# Patient Record
Sex: Male | Born: 1965
Health system: Southern US, Community
[De-identification: ages and names within clinical notes are randomized; demographics above are authoritative.]

## PROBLEM LIST (undated history)

## (undated) DIAGNOSIS — R251 Tremor, unspecified: Secondary | ICD-10-CM

## (undated) DIAGNOSIS — G4752 REM sleep behavior disorder: Secondary | ICD-10-CM

## (undated) DIAGNOSIS — E079 Disorder of thyroid, unspecified: Secondary | ICD-10-CM

## (undated) DIAGNOSIS — K219 Gastro-esophageal reflux disease without esophagitis: Secondary | ICD-10-CM

## (undated) DIAGNOSIS — R55 Syncope and collapse: Secondary | ICD-10-CM

## (undated) DIAGNOSIS — I499 Cardiac arrhythmia, unspecified: Secondary | ICD-10-CM

## (undated) DIAGNOSIS — G43909 Migraine, unspecified, not intractable, without status migrainosus: Secondary | ICD-10-CM

## (undated) DIAGNOSIS — M1990 Unspecified osteoarthritis, unspecified site: Secondary | ICD-10-CM

## (undated) DIAGNOSIS — G2581 Restless legs syndrome: Secondary | ICD-10-CM

## (undated) DIAGNOSIS — J9819 Other pulmonary collapse: Secondary | ICD-10-CM

## (undated) DIAGNOSIS — F32A Depression, unspecified: Secondary | ICD-10-CM

## (undated) DIAGNOSIS — G20A1 Parkinson's disease without dyskinesia, without mention of fluctuations: Secondary | ICD-10-CM

## (undated) DIAGNOSIS — N400 Enlarged prostate without lower urinary tract symptoms: Secondary | ICD-10-CM

## (undated) DIAGNOSIS — G20 Parkinson's disease: Secondary | ICD-10-CM

## (undated) DIAGNOSIS — G5600 Carpal tunnel syndrome, unspecified upper limb: Secondary | ICD-10-CM

## (undated) DIAGNOSIS — F419 Anxiety disorder, unspecified: Secondary | ICD-10-CM

## (undated) DIAGNOSIS — Z87442 Personal history of urinary calculi: Secondary | ICD-10-CM

## (undated) DIAGNOSIS — F329 Major depressive disorder, single episode, unspecified: Secondary | ICD-10-CM

## (undated) HISTORY — PX: SHOULDER SURGERY: SHX246

## (undated) HISTORY — PX: APPENDECTOMY: SHX54

---

## 2004-11-05 ENCOUNTER — Ambulatory Visit: Payer: Self-pay | Admitting: Family Medicine

## 2005-02-17 ENCOUNTER — Ambulatory Visit: Payer: Self-pay | Admitting: Family Medicine

## 2006-09-05 ENCOUNTER — Ambulatory Visit: Payer: Self-pay | Admitting: Family Medicine

## 2006-09-20 ENCOUNTER — Ambulatory Visit: Payer: Self-pay | Admitting: General Surgery

## 2007-10-04 DIAGNOSIS — Z85828 Personal history of other malignant neoplasm of skin: Secondary | ICD-10-CM

## 2007-10-04 HISTORY — DX: Personal history of other malignant neoplasm of skin: Z85.828

## 2007-11-09 ENCOUNTER — Encounter: Payer: Self-pay | Admitting: Unknown Physician Specialty

## 2007-12-02 ENCOUNTER — Encounter: Payer: Self-pay | Admitting: Unknown Physician Specialty

## 2008-11-13 ENCOUNTER — Ambulatory Visit: Payer: Self-pay | Admitting: Family Medicine

## 2009-11-09 ENCOUNTER — Ambulatory Visit: Payer: Self-pay | Admitting: Specialist

## 2010-04-01 ENCOUNTER — Ambulatory Visit: Payer: Self-pay | Admitting: Family Medicine

## 2010-04-07 ENCOUNTER — Ambulatory Visit: Payer: Self-pay | Admitting: Family Medicine

## 2010-06-14 ENCOUNTER — Ambulatory Visit: Payer: Self-pay | Admitting: Family Medicine

## 2010-07-25 ENCOUNTER — Emergency Department: Payer: Self-pay | Admitting: Emergency Medicine

## 2014-01-07 ENCOUNTER — Ambulatory Visit: Payer: Self-pay | Admitting: Otolaryngology

## 2014-02-05 DIAGNOSIS — H2510 Age-related nuclear cataract, unspecified eye: Secondary | ICD-10-CM | POA: Insufficient documentation

## 2014-03-25 DIAGNOSIS — I10 Essential (primary) hypertension: Secondary | ICD-10-CM | POA: Insufficient documentation

## 2014-03-25 DIAGNOSIS — R002 Palpitations: Secondary | ICD-10-CM | POA: Insufficient documentation

## 2014-04-08 DIAGNOSIS — I493 Ventricular premature depolarization: Secondary | ICD-10-CM | POA: Insufficient documentation

## 2014-04-08 DIAGNOSIS — I4892 Unspecified atrial flutter: Secondary | ICD-10-CM

## 2014-04-08 DIAGNOSIS — I4891 Unspecified atrial fibrillation: Secondary | ICD-10-CM | POA: Insufficient documentation

## 2014-04-21 ENCOUNTER — Ambulatory Visit: Payer: Self-pay | Admitting: Family Medicine

## 2014-05-06 ENCOUNTER — Ambulatory Visit: Payer: Self-pay | Admitting: Neurology

## 2014-05-14 DIAGNOSIS — K625 Hemorrhage of anus and rectum: Secondary | ICD-10-CM | POA: Insufficient documentation

## 2014-05-14 DIAGNOSIS — K219 Gastro-esophageal reflux disease without esophagitis: Secondary | ICD-10-CM | POA: Insufficient documentation

## 2014-05-27 ENCOUNTER — Ambulatory Visit: Payer: Self-pay | Admitting: Gastroenterology

## 2014-05-27 HISTORY — PX: ESOPHAGOGASTRODUODENOSCOPY: SHX1529

## 2014-05-27 HISTORY — PX: COLONOSCOPY: SHX174

## 2014-05-29 LAB — PATHOLOGY REPORT

## 2014-06-10 ENCOUNTER — Ambulatory Visit: Payer: Self-pay | Admitting: Neurology

## 2014-07-29 ENCOUNTER — Ambulatory Visit: Payer: Self-pay | Admitting: Neurology

## 2014-07-29 LAB — COMPREHENSIVE METABOLIC PANEL
Albumin: 3.8 g/dL (ref 3.4–5.0)
Alkaline Phosphatase: 86 U/L
Anion Gap: 7 (ref 7–16)
BUN: 15 mg/dL (ref 7–18)
Bilirubin,Total: 0.5 mg/dL (ref 0.2–1.0)
CALCIUM: 8.4 mg/dL — AB (ref 8.5–10.1)
Chloride: 105 mmol/L (ref 98–107)
Co2: 27 mmol/L (ref 21–32)
Creatinine: 0.89 mg/dL (ref 0.60–1.30)
EGFR (Non-African Amer.): >60
Glucose: 105 mg/dL — ABNORMAL HIGH (ref 65–99)
Osmolality: 279 (ref 275–301)
POTASSIUM: 4 mmol/L (ref 3.5–5.1)
SGOT(AST): 17 U/L (ref 15–37)
SGPT (ALT): 26 U/L
SODIUM: 139 mmol/L (ref 136–145)
Total Protein: 7 g/dL (ref 6.4–8.2)

## 2014-07-29 LAB — PROTEIN, CSF: Protein, CSF: 42 mg/dL (ref 15–45)

## 2014-07-29 LAB — CBC WITH DIFFERENTIAL/PLATELET
BASOS ABS: 0.1 10*3/uL (ref 0.0–0.1)
BASOS PCT: 1.5 %
EOS ABS: 0.3 10*3/uL (ref 0.0–0.7)
Eosinophil %: 4.8 %
HCT: 42.5 % (ref 40.0–52.0)
HGB: 13.9 g/dL (ref 13.0–18.0)
LYMPHS ABS: 1.5 10*3/uL (ref 1.0–3.6)
LYMPHS PCT: 28.7 %
MCH: 30.6 pg (ref 26.0–34.0)
MCHC: 32.6 g/dL (ref 32.0–36.0)
MCV: 94 fL (ref 80–100)
Monocyte #: 0.5 x10 3/mm (ref 0.2–1.0)
Monocyte %: 10 %
NEUTROS ABS: 2.9 10*3/uL (ref 1.4–6.5)
Neutrophil %: 55 %
Platelet: 294 10*3/uL (ref 150–440)
RBC: 4.53 10*6/uL (ref 4.40–5.90)
RDW: 13.5 % (ref 11.5–14.5)
WBC: 5.3 10*3/uL (ref 3.8–10.6)

## 2014-07-29 LAB — CSF CELL COUNT WITH DIFFERENTIAL
CSF Tube #: 3
Eosinophil: 0 %
LYMPHS PCT: 58 %
Monocytes/Macrophages: 33 %
Neutrophils: 9 %
OTHER CELLS: 0 %
RBC (CSF): 393 /mm3
WBC (CSF): 3 /mm3

## 2014-07-29 LAB — PROTIME-INR
INR: 1
PROTHROMBIN TIME: 13 s (ref 11.5–14.7)

## 2014-07-29 LAB — GLUCOSE, CSF: GLUCOSE, CSF: 65 mg/dL (ref 40–75)

## 2014-07-31 ENCOUNTER — Encounter (HOSPITAL_COMMUNITY): Payer: Self-pay | Admitting: Emergency Medicine

## 2014-07-31 ENCOUNTER — Emergency Department (HOSPITAL_COMMUNITY)
Admission: EM | Admit: 2014-07-31 | Discharge: 2014-07-31 | Disposition: A | Discharge status: Home / Self Care | Payer: Managed Care, Other (non HMO) | Attending: Emergency Medicine | Admitting: Emergency Medicine

## 2014-07-31 ENCOUNTER — Ambulatory Visit
Admit: 2014-07-31 | Discharge: 2014-07-31 | Disposition: A | Discharge status: Home / Self Care | Payer: 59 | Attending: Emergency Medicine | Admitting: Emergency Medicine

## 2014-07-31 DIAGNOSIS — F32A Depression, unspecified: Secondary | ICD-10-CM | POA: Insufficient documentation

## 2014-07-31 DIAGNOSIS — R519 Headache, unspecified: Secondary | ICD-10-CM

## 2014-07-31 DIAGNOSIS — K219 Gastro-esophageal reflux disease without esophagitis: Secondary | ICD-10-CM | POA: Insufficient documentation

## 2014-07-31 DIAGNOSIS — Z79899 Other long term (current) drug therapy: Secondary | ICD-10-CM | POA: Diagnosis not present

## 2014-07-31 DIAGNOSIS — N400 Enlarged prostate without lower urinary tract symptoms: Secondary | ICD-10-CM | POA: Insufficient documentation

## 2014-07-31 DIAGNOSIS — R55 Syncope and collapse: Secondary | ICD-10-CM | POA: Insufficient documentation

## 2014-07-31 DIAGNOSIS — R51 Headache: Secondary | ICD-10-CM | POA: Diagnosis present

## 2014-07-31 DIAGNOSIS — N200 Calculus of kidney: Secondary | ICD-10-CM | POA: Insufficient documentation

## 2014-07-31 DIAGNOSIS — F329 Major depressive disorder, single episode, unspecified: Secondary | ICD-10-CM | POA: Insufficient documentation

## 2014-07-31 DIAGNOSIS — G43909 Migraine, unspecified, not intractable, without status migrainosus: Secondary | ICD-10-CM | POA: Insufficient documentation

## 2014-07-31 LAB — BASIC METABOLIC PANEL
Anion gap: 12 (ref 5–15)
BUN: 15 mg/dL (ref 6–23)
CO2: 25 meq/L (ref 19–32)
CREATININE: 0.87 mg/dL (ref 0.50–1.35)
Calcium: 9.2 mg/dL (ref 8.4–10.5)
Chloride: 102 mEq/L (ref 96–112)
GFR calc non Af Amer: >90 mL/min (ref >90)
Glucose, Bld: 103 mg/dL — ABNORMAL HIGH (ref 70–99)
Potassium: 4.5 mEq/L (ref 3.7–5.3)
Sodium: 139 mEq/L (ref 137–147)

## 2014-07-31 LAB — CBC
HEMATOCRIT: 42.4 % (ref 39.0–52.0)
Hemoglobin: 14.6 g/dL (ref 13.0–17.0)
MCH: 31.5 pg (ref 26.0–34.0)
MCHC: 34.4 g/dL (ref 30.0–36.0)
MCV: 91.4 fL (ref 78.0–100.0)
Platelets: 270 10*3/uL (ref 150–400)
RBC: 4.64 MIL/uL (ref 4.22–5.81)
RDW: 12.9 % (ref 11.5–15.5)
WBC: 5.7 10*3/uL (ref 4.0–10.5)

## 2014-07-31 MED ORDER — HYDROMORPHONE HCL 1 MG/ML IJ SOLN
0.5000 mg | Freq: Once | INTRAMUSCULAR | Status: AC
  Administered 2014-07-31: 0.5 mg via INTRAVENOUS
  Filled 2014-07-31: qty 1

## 2014-07-31 MED ORDER — IOHEXOL 180 MG/ML  SOLN
1.0000 mL | Freq: Once | INTRAMUSCULAR | Status: AC | PRN
  Administered 2014-07-31: 1 mL via EPIDURAL

## 2014-07-31 MED ORDER — ONDANSETRON HCL 4 MG/2ML IJ SOLN
4.0000 mg | Freq: Once | INTRAMUSCULAR | Status: AC
  Administered 2014-07-31: 4 mg via INTRAVENOUS
  Filled 2014-07-31: qty 2

## 2014-07-31 MED ORDER — SODIUM CHLORIDE 0.9 % IV BOLUS (SEPSIS)
1000.0000 mL | Freq: Once | INTRAVENOUS | Status: AC
  Administered 2014-07-31: 1000 mL via INTRAVENOUS

## 2014-07-31 NOTE — ED Notes (Signed)
Pt had LP on Tuesday for rule out MS, sent here today for Headache and possible leak from LP site.

## 2014-07-31 NOTE — Discharge Instructions (Signed)
Epidural Blood Patch Discharge Instructions  1. Go home and rest quietly for the next 24 hours.  It is important to lie flat for the next 24 hours.  Get up only to go to the restroom.  You may lie in the bed or on a couch on your back, your stomach, your left side or your right side.  You may have one pillow under your head.  You may have pillows between your knees while you are on your side or under your knees while you are on your back.  2. DO NOT drive today.  Recline the seat as far back as it will go, while still wearing your seat belt, on the way home.  3. You may get up to go to the bathroom as needed.  You may sit up for 10 minutes to eat.  You may resume your normal diet and medications unless otherwise indicated.  Drink plenty of extra fluids today and tomorrow.  Caffeine may help your body make spinal fluid faster, so add to or increase your caffeine intake as much as possible.    4. You may resume normal activities after your 24 hours of bed rest is over; however, do not exert yourself strongly or do any heavy lifting tomorrow.

## 2014-07-31 NOTE — Progress Notes (Signed)
20cc blood drawn from left AC space without difficulty for epidural blood patch.  jkl

## 2014-07-31 NOTE — ED Notes (Signed)
PA student at bedside.  Patient had LP on Tuesday to r/o MS with PCP, patient rested all day after the procedure and the next day he went to work.  Patient states he had to leave work early because of headache.  Patient has had positional headache, neck pain, and nausea (sitting up makes worse, lying down makes it better).  Also c/o tingling in legs and face, and photophobia.  Denies chest pain, abdominal pain.

## 2014-07-31 NOTE — ED Notes (Signed)
Dr. Corky Downslsen ( regional performed lp)

## 2014-07-31 NOTE — Discharge Instructions (Signed)
Go straight to [facility] at [employer].  Please call your doctor for a followup appointment within 24-48 hours. When you talk to your doctor please let them know that you were seen in the emergency department and have them acquire all of your records so that they can discuss the findings with you and formulate a treatment plan to fully care for your new and ongoing problems.

## 2014-07-31 NOTE — ED Provider Notes (Signed)
CSN: [id number]     Arrival date & time 07/31/14  [id number] History   First MD Initiated Contact with Patient 07/31/14 [phone]     Chief Complaint  Patient presents with  . Headache     (Consider location/radiation/quality/duration/timing/severity/associated sxs/prior Treatment) HPI Comments: Pt had LP 2 days ago for ? MS sx and abnormal MRI's over last year - He has had ongoing positional headache - improved with laying down. He states that he has associated diplopia, nausea, photophobia. The symptoms are persistent over the last 2 days, Tylenol without any improvement. Denies focal weakness or numbness of the legs and has minimal pain in his lower back at the lumbar puncture site. He states that there was one attempt under fluoroscopy guidance but manipulation of the needle under attempt was significant and the patient had a hard time producing CSF thus he was tilted into different positions and the needle was manipulated to obtain better CSF flow.  Patient is a 48 y.o. male presenting with headaches. The history is provided by the patient and the spouse.  Headache   History reviewed. No pertinent past medical history. History reviewed. No pertinent past surgical history. History reviewed. No pertinent family history. History  Substance Use Topics  . Smoking status: Never Smoker   . Smokeless tobacco: Not on file  . Alcohol Use: No    Review of Systems  Neurological: Positive for headaches.  All other systems reviewed and are negative.     Allergies  Codeine  Home Medications   Prior to Admission medications   Medication Sig Start Date End Date Taking? Authorizing Provider  cholecalciferol (VITAMIN D-400) 400 UNITS TABS tablet Take 800 Units by mouth daily.   Yes Historical Provider, MD  omeprazole (PRILOSEC) 20 MG capsule Take 20 mg by mouth daily.   Yes Historical Provider, MD  sertraline (ZOLOFT) 100 MG tablet Take 75 mg by mouth daily.   Yes Historical Provider, MD   Clindamycin Phosphate foam  05/08/14   Historical Provider, MD  doxycycline (VIBRA-TABS) 100 MG tablet  06/26/14   Historical Provider, MD  Omega-3 Fatty Acids (FISH OIL CONCENTRATE) 300 MG CAPS Take by mouth. 10/26/12   Historical Provider, MD  polyethylene glycol-electrolytes (NULYTELY/GOLYTELY) 420 G solution  05/08/14   Historical Provider, MD  predniSONE (DELTASONE) 10 MG tablet  06/24/14   Historical Provider, MD  sulfamethoxazole-trimethoprim (BACTRIM DS) 800-160 MG per tablet  05/04/14   Historical Provider, MD  tamsulosin (FLOMAX) 0.4 MG CAPS capsule  06/18/14   Historical Provider, MD  triamcinolone cream (KENALOG) 0.1 %  05/08/14   Historical Provider, MD  zolpidem (AMBIEN) 10 MG tablet  06/14/14   Historical Provider, MD   BP 109/64  Pulse 49  Temp(Src) 97.9 F (36.6 C) (Oral)  Resp 16  Ht 6\' 2"  (1.88 m)  Wt 211 lb (95.709 kg)  BMI 27.08 kg/m2  SpO2 99% Physical Exam  Nursing note and vitals reviewed. Constitutional: He appears well-developed and well-nourished. No distress.  HENT:  Head: Normocephalic and atraumatic.  Mouth/Throat: Oropharynx is clear and moist. No oropharyngeal exudate.  Eyes: Conjunctivae and EOM are normal. Pupils are equal, round, and reactive to light. Right eye exhibits no discharge. Left eye exhibits no discharge. No scleral icterus.  Neck: Normal range of motion. Neck supple. No JVD present. No thyromegaly present.  Cardiovascular: Normal rate, regular rhythm, normal heart sounds and intact distal pulses.  Exam reveals no gallop and no friction rub.   No murmur heard. Pulmonary/Chest: Effort normal  and breath sounds normal. No respiratory distress. He has no wheezes. He has no rales.  Abdominal: Soft. Bowel sounds are normal. He exhibits no distension and no mass. There is no tenderness.  Musculoskeletal: Normal range of motion. He exhibits no edema and no tenderness.  Lymphadenopathy:    He has no cervical adenopathy.  Neurological: He is alert.  Coordination normal.  Speech is clear, movements are coordinated and accurate, strength is normal in all 4 extremities, sensation is normal in all 4 extremities, cranial nerves III through XII intact  Skin: Skin is warm and dry. No rash noted. No erythema.  Psychiatric: He has a normal mood and affect. His behavior is normal.    ED Course  Procedures (including critical care time) Labs Review Labs Reviewed  BASIC METABOLIC PANEL - Abnormal; Notable for the following:    Glucose, Bld 103 (*)    All other components within normal limits  CBC    Imaging Review Dg Epidurography  07/31/2014   CLINICAL DATA:  Post lumbar puncture headache.  FLUOROSCOPY TIME:  0 min 28 seconds  Dose: 0.110 Gycm2  PROCEDURE: LUMBAR EPIDURAL BLOOD PATCH INJECTION  After a thorough discussion of risks and benefits of the procedure, written and verbal consent was obtained. Specific risks included puncture of the thecal sac and dura as well as nontherapeutic injection with general risks of bleeding, infection, injury to nerves, blood vessels, and adjacent structures. Verbal consent was obtained by Dr. Carlota RaspberryLamke. We discussed the medium to high probability of achievement of therapeutic goal, dependent on volume of infusion.  Prior to the procedure, 20 ml of the patient's blood was harvested using stringent sterile technique.  An interlaminar approach was performed at L3-L4. Under stringent sterile technique, overlying skin was cleansed with betadine soap and anesthetized with 1% lidocaine without epinephrine. A 3.5 inch 20 gauge needle was advanced using loss-of-resistance technique.  DIAGNOSTIC EPIDURAL INJECTION  Injection of Omnipaque 180 shows a good epidural pattern with spread above and below the level of needle placement, primarily on the side of needle placement. No vascular or subarachnoid opacification is seen.  THERAPEUTIC EPIDURAL INJECTION  15 ml of the patient's blood was injected into the epidural space at the site  of prior lumbar puncture.  IMPRESSION: Technically successful lumbar blood patch at L3-L4.   Electronically Signed   By: Andreas NewportGeoffrey  Lamke M.D.   On: 07/31/2014 12:14      MDM   Final diagnoses:  Headache    The patient has normal vital signs, has ongoing positional headache and may benefit from a blood patch. We will place order, discussed with radiologic technician who will communicate with radiology physician. Patient otherwise appears stable and does not have any signs of post LP infection or hemorrhage.  D/w Dr. Margo AyeHall who [facility].  D/w, staff at imaging facility, orders placed, d/c to facility - family in agrement.  Meds given in ED:  Medications  sodium chloride 0.9 % bolus 1,000 mL (0 mLs Intravenous Stopped 07/31/14 1055)  HYDROmorphone (DILAUDID) injection 0.5 mg (0.5 mg Intravenous Given 07/31/14 1055)  ondansetron (ZOFRAN) injection 4 mg (4 mg Intravenous Given 07/31/14 1055)    Discharge Medication List as of 07/31/2014 10:44 AM          Vida RollerBrian D Shahin Knierim, MD 07/31/14 1650

## 2014-08-20 DIAGNOSIS — G20 Parkinson's disease: Secondary | ICD-10-CM | POA: Insufficient documentation

## 2014-09-04 ENCOUNTER — Other Ambulatory Visit: Payer: Self-pay | Admitting: Urology

## 2014-09-04 NOTE — Progress Notes (Signed)
Called Dr. Vevelyn RoyalsBorden's office and asked for release of  orders to Epic sign and held surgery 09-08-14 pre op 09-05-14 Thanks

## 2014-09-04 NOTE — Patient Instructions (Addendum)
Kurt Maldonado  09/04/2014   Your procedure is scheduled on:  09/08/2014    Come thru the Cancer Center Entrance. .  Follow the Signs to Short Stay Center at   1245pm  Call this number if you have problems the morning of surgery: [phone]   Remember:   Do not eat food after midnite.  May have clear liquids until 0800am then nothing by mouth.    Take these medicines the morning of surgery with A SIP OF WATER: Proscar, Prilosec, Flomax    Do not wear jewelry,   Do not wear lotions, powders, or perfumes.  deodorant.   Men may shave face and neck.  Do not bring valuables to the hospital.  Contacts, dentures or bridgework may not be worn into surgery.       Patients discharged the day of surgery will not be allowed to drive  home.  Name and phone number of your driver:       Please read over the following fact sheets that you were given:    CLEAR LIQUID DIET   Foods Allowed                                                                     Foods Excluded  Coffee and tea, regular and decaf                             liquids that you cannot  Plain Jell-O in any flavor                                             see through such as: Fruit ices (not with fruit pulp)                                     milk, soups, orange juice  Iced Popsicles                                    All solid food Carbonated beverages, regular and diet                                    Cranberry, grape and apple juices Sports drinks like Gatorade Lightly seasoned clear broth or consume(fat free) Sugar, honey syrup  Sample Menu Breakfast                                Lunch                                     Supper Cranberry juice                    Beef broth  Chicken broth Jell-O                                     Grape juice                           Apple juice Coffee or tea                        Jell-O                                      Popsicle                                Coffee or tea                        Coffee or tea  _____________________________________________________________________   coughing and deep breathing exercises, leg exercises            [place] - Preparing for Surgery Before surgery, you can play an important role.  Because skin is not sterile, your skin needs to be as free of germs as possible.  You can reduce the number of germs on your skin by washing with CHG (chlorahexidine gluconate) soap before surgery.  CHG is an antiseptic cleaner which kills germs and bonds with the skin to continue killing germs even after washing. Please DO NOT use if you have an allergy to CHG or antibacterial soaps.  If your skin becomes reddened/irritated stop using the CHG and inform your nurse when you arrive at Short Stay. Do not shave (including legs and underarms) for at least 48 hours prior to the first CHG shower.  You may shave your face/neck. Please follow these instructions carefully:  1.  Shower with CHG Soap the night before surgery and the  morning of Surgery.  2.  If you choose to wash your hair, wash your hair first as usual with your  normal  shampoo.  3.  After you shampoo, rinse your hair and body thoroughly to remove the  shampoo.                           4.  Use CHG as you would any other liquid soap.  You can apply chg directly  to the skin and wash                       Gently with a scrungie or clean washcloth.  5.  Apply the CHG Soap to your body ONLY FROM THE NECK DOWN.   Do not use on face/ open                           Wound or open sores. Avoid contact with eyes, ears mouth and genitals (private parts).                       Wash face,  Genitals (private parts) with your normal soap.             6.  Wash thoroughly, paying special attention to  the area where your surgery  will be performed.  7.  Thoroughly rinse your body with warm water from the neck down.  8.  DO NOT shower/wash with your normal  soap after using and rinsing off  the CHG Soap.                9.  Pat yourself dry with a clean towel.            10.  Wear clean pajamas.            11.  Place clean sheets on your bed the night of your first shower and do not  sleep with pets. Day of Surgery : Do not apply any lotions/deodorants the morning of surgery.  Please wear clean clothes to the hospital/surgery center.  FAILURE TO FOLLOW THESE INSTRUCTIONS MAY RESULT IN THE CANCELLATION OF YOUR SURGERY PATIENT SIGNATURE_________________________________  NURSE SIGNATURE__________________________________  ________________________________________________________________________

## 2014-09-05 ENCOUNTER — Ambulatory Visit (HOSPITAL_COMMUNITY)
Admission: RE | Admit: 2014-09-05 | Discharge: 2014-09-05 | Disposition: A | Discharge status: Home / Self Care | Payer: Managed Care, Other (non HMO) | Source: Ambulatory Visit | Attending: Urology | Admitting: Urology

## 2014-09-05 ENCOUNTER — Encounter (INDEPENDENT_AMBULATORY_CARE_PROVIDER_SITE_OTHER): Payer: Self-pay

## 2014-09-05 ENCOUNTER — Encounter (HOSPITAL_COMMUNITY): Payer: Self-pay

## 2014-09-05 ENCOUNTER — Encounter (HOSPITAL_COMMUNITY)
Admission: RE | Admit: 2014-09-05 | Discharge: 2014-09-05 | Disposition: A | Discharge status: Home / Self Care | Payer: Managed Care, Other (non HMO) | Source: Ambulatory Visit | Attending: Urology | Admitting: Urology

## 2014-09-05 DIAGNOSIS — Z01818 Encounter for other preprocedural examination: Secondary | ICD-10-CM | POA: Diagnosis present

## 2014-09-05 DIAGNOSIS — R002 Palpitations: Secondary | ICD-10-CM | POA: Insufficient documentation

## 2014-09-05 HISTORY — DX: Unspecified osteoarthritis, unspecified site: M19.90

## 2014-09-05 HISTORY — DX: Syncope and collapse: R55

## 2014-09-05 HISTORY — DX: Cardiac arrhythmia, unspecified: I49.9

## 2014-09-05 HISTORY — DX: Other pulmonary collapse: J98.19

## 2014-09-05 HISTORY — DX: Anxiety disorder, unspecified: F41.9

## 2014-09-05 HISTORY — DX: Gastro-esophageal reflux disease without esophagitis: K21.9

## 2014-09-05 LAB — BASIC METABOLIC PANEL
Anion gap: 13 (ref 5–15)
BUN: 15 mg/dL (ref 6–23)
CALCIUM: 9.6 mg/dL (ref 8.4–10.5)
CO2: 25 meq/L (ref 19–32)
CREATININE: 0.87 mg/dL (ref 0.50–1.35)
Chloride: 100 mEq/L (ref 96–112)
GFR calc Af Amer: >90 mL/min (ref >90)
GLUCOSE: 90 mg/dL (ref 70–99)
Potassium: 4.3 mEq/L (ref 3.7–5.3)
Sodium: 138 mEq/L (ref 137–147)

## 2014-09-05 LAB — CBC
HEMATOCRIT: 39 % (ref 39.0–52.0)
HEMOGLOBIN: 13.2 g/dL (ref 13.0–17.0)
MCH: 30.7 pg (ref 26.0–34.0)
MCHC: 33.8 g/dL (ref 30.0–36.0)
MCV: 90.7 fL (ref 78.0–100.0)
Platelets: 278 10*3/uL (ref 150–400)
RBC: 4.3 MIL/uL (ref 4.22–5.81)
RDW: 12.9 % (ref 11.5–15.5)
WBC: 5.6 10*3/uL (ref 4.0–10.5)

## 2014-09-05 NOTE — Progress Notes (Signed)
Left message on voice mail of Yates DecampSelita Bradsher that orders need to be signed off in EPIC.

## 2014-09-05 NOTE — Progress Notes (Addendum)
EKG- 03/18/2014 chart  Evaluation of palpitations- OV note 04/08/14 on chart - Dr Darrold JunkerParaschos  03/27/14- Stress test on chart  Under Care Everywhere in EPIC- LOV note ( 08/20/14)  with Dr Sherryll BurgerShah ( Neuro) regarding syncopal episodes and EEG results  Done 08/15/14 in EPIC.

## 2014-09-06 NOTE — H&P (Signed)
Chief Complaint Lower urinary tract symptoms   History of Present Illness Kurt Maldonado is a 48 year old gentleman seen today at the request of Dr. Julieanne Manson for bothersome lower urinary tract symptoms. He has apparently had recurrent episodes of prostatitis including at least a few episodes of acute febrile prostatitis with positive bacterial cultures. I do not have the results of the specific organisms responsible for his prostatitis. His most recent episode was apparently in September when his urine culture was negative but he did have bothersome voiding symptoms and fever and was treated with doxycycline for possible prostatitis as well as possible upper respiratory infection. His symptoms however have been somewhat chronic and progressive over the last few years. His symptoms currently include a sense of incomplete emptying, urinary frequency, intermittency, urgency, weak stream, straining, and nocturia. His most bothersome symptoms include urinary frequency and urgency which can sometimes be quite severe. His baseline IPSS is 23. His baseline symptoms are with current treatment of tamsulosin. He began tamsulosin in September after his recent episode of prostatitis. He subsequently discontinued this medication and felt the symptoms were worse off therapy and therefore restarted tamsulosin with some symptomatic improvement. He does have significant bother from his symptoms.    He also has a family history of prostate cancer. His father was diagnosed at age 60 with prostate cancer and underwent treatment with a radical prostatectomy. His father also apparently has a renal mass thought to be renal cell carcinoma. This was diagnosed at age 39 and has been undergoing surveillance. His father is currently living at age 2. He denies other male family members who have had prostate cancer. He apparently has been undergoing regular PSA testing although I do not have those results today. Furthermore, he has  an aunt who died of metastatic breast cancer at age 17.     He denies a history of hematuria, STDs, GU malignancy/trauma/surgery.   Past Medical History Problems  1. History of Anxiety (F41.9) 2. History of benign prostatic hypertrophy (Z87.438) 3. History of depression (Z86.59) 4. History of Vitamin D deficiency (E55.9)  Surgical History Problems  1. History of Appendectomy 2. History of Lung Surgery 3. History of Rotator Cuff Repair 4. History of Shoulder Surgery  Current Meds 1. Multi-Day TABS;  Therapy: (Recorded:18Nov2015) to Recorded 2. Sertraline HCl TABS;  Therapy: (Recorded:18Nov2015) to Recorded 3. Tamsulosin HCl 0.4 MG CP24;  Therapy: (Recorded:18Nov2015) to Recorded  Allergies Medication  1. Codeine Derivatives  Family History Problems  1. Family history of malignant neoplasm of breast (Z80.3) : Paternal Grandmother 2. Family history of prostate cancer (Z80.42) : Father 3. Family history of rheumatoid arthritis (Z82.61)  Social History Problems    Denied: History of Alcohol use   Married   Never a smoker  Review of Systems Genitourinary, constitutional, skin, eye, otolaryngeal, hematologic/lymphatic, cardiovascular, pulmonary, endocrine, musculoskeletal, gastrointestinal, neurological and psychiatric system(s) were reviewed and pertinent findings if present are noted and are otherwise negative.  Constitutional: feeling tired (fatigue).  ENT: sinus problems.    Vitals Vital Signs [Data Includes: Last 1 Day]  Recorded: 18Nov2015 11:22AM  Height: 6 ft 2 in Weight: 211 lb  BMI Calculated: 27.09 BSA Calculated: 2.22 Blood Pressure: 119 / 76 Heart Rate: 77  Physical Exam Constitutional: Well nourished and well developed . No acute distress.  ENT:. The ears and nose are normal in appearance.  Neck: The appearance of the neck is normal and no neck mass is present.  Pulmonary: No respiratory distress, normal respiratory rhythm and effort  and clear  bilateral breath sounds.  Cardiovascular: Heart rate and rhythm are normal . No peripheral edema.  Abdomen: The abdomen is soft and nontender. No masses are palpated. No CVA tenderness. No hernias are palpable. No hepatosplenomegaly noted.  Rectal: Rectal exam demonstrates normal sphincter tone, no tenderness and no masses. Prostate size is estimated to be 40 g. The prostate has no nodularity and is not tender. The left seminal vesicle is nonpalpable. The right seminal vesicle is nonpalpable. The perineum is normal on inspection.  Genitourinary: Examination of the penis demonstrates no discharge, no masses, no lesions and a normal meatus. The scrotum is without lesions. The right epididymis is palpably normal and non-tender. The left epididymis is palpably normal and non-tender. The right testis is non-tender and without masses. The left testis is non-tender and without masses.  Lymphatics: The femoral and inguinal nodes are not enlarged or tender.  Skin: Normal skin turgor, no visible rash and no visible skin lesions.  Neuro/Psych:. Mood and affect are appropriate.    Results/Data Urine [Data Includes: Last 1 Day]   18Nov2015  COLOR YELLOW   APPEARANCE CLEAR   SPECIFIC GRAVITY 1.010   pH 7.5   GLUCOSE NEG mg/dL  BILIRUBIN NEG   KETONE NEG mg/dL  BLOOD NEG   PROTEIN NEG mg/dL  UROBILINOGEN 0.2 mg/dL  NITRITE NEG   LEUKOCYTE ESTERASE NEG    PVR: 203 cc   Assessment Assessed  1. Benign prostatic hyperplasia (BPH) with straining on urination (N40.1,R39.16)  Plan Benign prostatic hyperplasia (BPH) with straining on urination  1. Start: Finasteride 5 MG Oral Tablet; Take 1 tablet every day 2. Start: Tamsulosin HCl - 0.4 MG Oral Capsule; TAKE 2 CAPSULES AT       BEDTIME 3. PSA REFLEX TO FREE; Status:Hold For - Specimen/Data Collection,Appointment;  Requested for:18Nov2015;  4. PVR U/S; Status:Complete;   Done: 18Nov2015 5. PVR U/S; Status:Hold For - Appointment,Date of Service;  Requested for:18Nov2015;  6. Follow-up Month x 6 Office  Follow-up  Status: Hold For - Appointment,Date of Service   Requested for: 18Nov2015 Health Maintenance  7. UA With REFLEX; [Do Not Release]; Status:Complete;   Done: 18Nov2015 11:17AM  Discussion/Summary 1. BPH/LUTS: We discussed having him increase his tamsulosin is 0.8 mg nightly to see if this might prove to be more effective at least in the short-term. We also discussed maximizing medical therapy with finasteride 5 mg to provide additional benefit over the long-term. We reviewed proper use and potential side effects of these medications. I encouraged him to hold off on increasing his alpha blocker dose until he has completed a neurologic evaluation for an episode of syncope that recently occurred while he was off alpha blocker therapy. He will also have a baseline PSA today and he understands the expected effects of 5 alpha reductase inhibitor on his PSA. We will recheck his PSA in 6 months to assess his new baseline. He will follow-up at that time with a PVR/IPSS questionnaire to assess his response to treatment. If he does not see significant improvement, we have discussed proceeding with additional diagnostic evaluation at that time including cystoscopy/urodynamics.    Cc: Dr. Julieanne Mansonichard Gilbert     Verified Results PSA REFLEX TO FREE1 18Nov2015 12:30PM1 Lyda PeroneBorden, Lester1  SPECIMEN TYPE: BLOOD  [Aug 24, 2014 3:21PM Annelisa Ryback] I spoke with Mr. Lalla BrothersLambert and reviewed his PSA result and gave him options of biopsy vs. surveillance monitoring. He wishes to proceed with a prostate biopsy. Potential risks and complications reviewed and  he consents to proceed.   Test Name Result Flag Reference  PSA1 3.26 ng/mL1  <=4.001  TEST METHODOLOGY: ECLIA PSA (ELECTROCHEMILUMINESCENCE IMMUNOASSAY)     1. Amended By: Heloise PurpuraBorden, Elkin Belfield; Aug 24 2014 3:22 PM EST  Signatures Electronically signed by : Heloise PurpuraLester Madysen Faircloth, M.D.; Aug 24 2014  3:22PM EST

## 2014-09-07 NOTE — Anesthesia Preprocedure Evaluation (Addendum)
Anesthesia Evaluation  Patient identified by MRN, date of birth, ID band Patient awake    Reviewed: Allergy & Precautions, H&P , NPO status , Patient's Chart, lab work & pertinent test results  Airway Mallampati: II  TM Distance: >3 FB Neck ROM: Full    Dental no notable dental hx. (+) Dental Advisory Given   Pulmonary sleep apnea ,  breath sounds clear to auscultation  Pulmonary exam normal       Cardiovascular Exercise Tolerance: Good hypertension, negative cardio ROS  Rhythm:Regular Rate:Normal     Neuro/Psych  Headaches, PSYCHIATRIC DISORDERS Anxiety Depression    GI/Hepatic Neg liver ROS, GERD-  Medicated and Controlled,  Endo/Other  negative endocrine ROS  Renal/GU negative Renal ROS  negative genitourinary   Musculoskeletal  (+) Arthritis -, Osteoarthritis,    Abdominal   Peds negative pediatric ROS (+)  Hematology negative hematology ROS (+)   Anesthesia Other Findings   Reproductive/Obstetrics negative OB ROS                             Anesthesia Physical Anesthesia Plan  ASA: II  Anesthesia Plan: MAC   Post-op Pain Management:    Induction: Intravenous  Airway Management Planned: Nasal Cannula  Additional Equipment:   Intra-op Plan:   Post-operative Plan: Extubation in OR  Informed Consent: I have reviewed the patients History and Physical, chart, labs and discussed the procedure including the risks, benefits and alternatives for the proposed anesthesia with the patient or authorized representative who has indicated his/her understanding and acceptance.   Dental advisory given  Plan Discussed with: CRNA  Anesthesia Plan Comments:         Anesthesia Quick Evaluation

## 2014-09-08 ENCOUNTER — Encounter (HOSPITAL_COMMUNITY): Payer: Self-pay | Admitting: *Deleted

## 2014-09-08 ENCOUNTER — Ambulatory Visit (HOSPITAL_COMMUNITY): Payer: Managed Care, Other (non HMO) | Admitting: Anesthesiology

## 2014-09-08 ENCOUNTER — Ambulatory Visit (HOSPITAL_COMMUNITY)
Admission: RE | Admit: 2014-09-08 | Discharge: 2014-09-08 | Disposition: A | Discharge status: Home / Self Care | Payer: Managed Care, Other (non HMO) | Source: Ambulatory Visit | Attending: Urology | Admitting: Urology

## 2014-09-08 ENCOUNTER — Encounter (HOSPITAL_COMMUNITY)
Admission: RE | Disposition: A | Discharge status: Home / Self Care | Payer: Self-pay | Source: Ambulatory Visit | Attending: Urology

## 2014-09-08 ENCOUNTER — Emergency Department (HOSPITAL_COMMUNITY): Admission: EM | Admit: 2014-09-08 | Discharge: 2014-09-08 | Discharge status: Absent without leave | Payer: 59

## 2014-09-08 DIAGNOSIS — I10 Essential (primary) hypertension: Secondary | ICD-10-CM | POA: Insufficient documentation

## 2014-09-08 DIAGNOSIS — G4730 Sleep apnea, unspecified: Secondary | ICD-10-CM | POA: Diagnosis not present

## 2014-09-08 DIAGNOSIS — N400 Enlarged prostate without lower urinary tract symptoms: Secondary | ICD-10-CM | POA: Diagnosis present

## 2014-09-08 DIAGNOSIS — F329 Major depressive disorder, single episode, unspecified: Secondary | ICD-10-CM | POA: Insufficient documentation

## 2014-09-08 DIAGNOSIS — M1990 Unspecified osteoarthritis, unspecified site: Secondary | ICD-10-CM | POA: Insufficient documentation

## 2014-09-08 DIAGNOSIS — K219 Gastro-esophageal reflux disease without esophagitis: Secondary | ICD-10-CM | POA: Insufficient documentation

## 2014-09-08 DIAGNOSIS — Z8042 Family history of malignant neoplasm of prostate: Secondary | ICD-10-CM | POA: Diagnosis not present

## 2014-09-08 DIAGNOSIS — Z885 Allergy status to narcotic agent status: Secondary | ICD-10-CM | POA: Insufficient documentation

## 2014-09-08 DIAGNOSIS — E559 Vitamin D deficiency, unspecified: Secondary | ICD-10-CM | POA: Insufficient documentation

## 2014-09-08 DIAGNOSIS — F419 Anxiety disorder, unspecified: Secondary | ICD-10-CM | POA: Diagnosis not present

## 2014-09-08 DIAGNOSIS — N411 Chronic prostatitis: Secondary | ICD-10-CM | POA: Diagnosis not present

## 2014-09-08 HISTORY — PX: PROSTATE BIOPSY: SHX241

## 2014-09-08 SURGERY — BIOPSY, PROSTATE, RECTAL APPROACH, WITH US GUIDANCE
Anesthesia: Monitor Anesthesia Care

## 2014-09-08 MED ORDER — MIDAZOLAM HCL 2 MG/2ML IJ SOLN
INTRAMUSCULAR | Status: AC
  Filled 2014-09-08: qty 2

## 2014-09-08 MED ORDER — FLEET ENEMA 7-19 GM/118ML RE ENEM: 1.0000 | ENEMA | Freq: Once | RECTAL | Status: DC

## 2014-09-08 MED ORDER — MIDAZOLAM HCL 5 MG/5ML IJ SOLN
INTRAMUSCULAR | Status: DC | PRN
  Administered 2014-09-08: 2 mg via INTRAVENOUS

## 2014-09-08 MED ORDER — CEFAZOLIN SODIUM-DEXTROSE 2-3 GM-% IV SOLR
INTRAVENOUS | Status: AC
  Filled 2014-09-08: qty 50

## 2014-09-08 MED ORDER — LACTATED RINGERS IV SOLN
INTRAVENOUS | Status: DC | PRN
  Administered 2014-09-08: 15:00:00 via INTRAVENOUS

## 2014-09-08 MED ORDER — PROPOFOL 10 MG/ML IV BOLUS
INTRAVENOUS | Status: AC
  Filled 2014-09-08: qty 20

## 2014-09-08 MED ORDER — PROPOFOL 10 MG/ML IV EMUL
INTRAVENOUS | Status: DC | PRN
  Administered 2014-09-08: 50 mg via INTRAVENOUS

## 2014-09-08 MED ORDER — FENTANYL CITRATE 0.05 MG/ML IJ SOLN: 25.0000 ug | INTRAMUSCULAR | Status: DC | PRN

## 2014-09-08 MED ORDER — FENTANYL CITRATE 0.05 MG/ML IJ SOLN
INTRAMUSCULAR | Status: AC
  Filled 2014-09-08: qty 2

## 2014-09-08 MED ORDER — LACTATED RINGERS IV SOLN
INTRAVENOUS | Status: DC
  Administered 2014-09-08: 1000 mL via INTRAVENOUS

## 2014-09-08 MED ORDER — ONDANSETRON HCL 4 MG/2ML IJ SOLN
INTRAMUSCULAR | Status: AC
  Filled 2014-09-08: qty 2

## 2014-09-08 MED ORDER — ONDANSETRON HCL 4 MG/2ML IJ SOLN: 4.0000 mg | Freq: Once | INTRAMUSCULAR | Status: DC | PRN

## 2014-09-08 MED ORDER — ONDANSETRON HCL 4 MG/2ML IJ SOLN
INTRAMUSCULAR | Status: DC | PRN
  Administered 2014-09-08: 4 mg via INTRAVENOUS

## 2014-09-08 MED ORDER — LIDOCAINE HCL 2 % IJ SOLN
INTRAMUSCULAR | Status: AC
  Filled 2014-09-08: qty 20

## 2014-09-08 MED ORDER — FENTANYL CITRATE 0.05 MG/ML IJ SOLN
INTRAMUSCULAR | Status: DC | PRN
  Administered 2014-09-08: 50 ug via INTRAVENOUS

## 2014-09-08 MED ORDER — PROPOFOL INFUSION 10 MG/ML OPTIME
INTRAVENOUS | Status: DC | PRN
  Administered 2014-09-08: 100 ug/kg/min via INTRAVENOUS

## 2014-09-08 MED ORDER — LIDOCAINE HCL 2 % IJ SOLN
INTRAMUSCULAR | Status: DC | PRN
  Administered 2014-09-08: 10 mL

## 2014-09-08 MED ORDER — LIDOCAINE HCL (CARDIAC) 20 MG/ML IV SOLN
INTRAVENOUS | Status: AC
  Filled 2014-09-08: qty 5

## 2014-09-08 SURGICAL SUPPLY — 3 items
NEEDLE SPNL 22GX7 SPINOC (NEEDLE) ×3 IMPLANT
SYR CONTROL 10ML LL (SYRINGE) IMPLANT
UNDERPAD 30X30 INCONTINENT (UNDERPADS AND DIAPERS) ×3 IMPLANT

## 2014-09-08 NOTE — Progress Notes (Signed)
Patient and wife informed that surgery delayed. They verbalize understanding.

## 2014-09-08 NOTE — ED Notes (Signed)
RN called short stay to inquire about patient's encounter with them-informed them patient is in ED complaining of pain-per Pam, RN, states she was going to call MD to get script for pain so patient could avoid and ED visit

## 2014-09-08 NOTE — Anesthesia Postprocedure Evaluation (Signed)
  Anesthesia Post-op Note  Patient: Kurt PrestoRichard T Maldonado  Procedure(s) Performed: Procedure(s) (LRB): BIOPSY TRANSRECTAL ULTRASONIC PROSTATE (TUBP) (N/A)  Patient Location: PACU  Anesthesia Type: MAC  Level of Consciousness: awake and alert   Airway and Oxygen Therapy: Patient Spontanous Breathing  Post-op Pain: mild  Post-op Assessment: Post-op Vital signs reviewed, Patient's Cardiovascular Status Stable, Respiratory Function Stable, Patent Airway and No signs of Nausea or vomiting  Last Vitals:  Filed Vitals:   09/08/14 1645  BP: 109/68  Pulse: 58  Temp:   Resp: 15    Post-op Vital Signs: stable   Complications: No apparent anesthesia complications

## 2014-09-08 NOTE — Discharge Instructions (Signed)
Call if you develop fever > 101 or difficulty urinating.  Some blood in the urine or with bowel movements is expected for the next few days.  You also may experience some blood in the semen intermittently for the next couple of months which is normal.

## 2014-09-08 NOTE — Transfer of Care (Signed)
Immediate Anesthesia Transfer of Care Note  Patient: Kurt PrestoRichard T Innocent  Procedure(s) Performed: Procedure(s): BIOPSY TRANSRECTAL ULTRASONIC PROSTATE (TUBP) (N/A)  Patient Location: PACU  Anesthesia Type:MAC  Level of Consciousness: awake, alert  and oriented  Airway & Oxygen Therapy: Patient Spontanous Breathing and Patient connected to face mask oxygen  Post-op Assessment: Report given to PACU RN  Post vital signs: Reviewed and stable  Complications: No apparent anesthesia complications

## 2014-09-08 NOTE — Progress Notes (Addendum)
Received call from patient's wife who is in ER waiting room with patient who was discharge from short stay for transrectal ultrasound of the prostate at 1730. Patient had reported no pain to minimal pain at discharge. Paged Dr Marlou PorchHerrick who is on call. He is to call patient's wife. Dr Marlou PorchHerrick told writer that they never give pain medication for these biopsies as there is minimal pain with this procedure and patient should be evaluated if he is in this much pain.  1946  Called patient's cell phone. He is doing much better and left the ER waiting room to go home. Dr Marlou PorchHerrick thought pain and pressure was spasms. RN tells family that he will receive phone call in AM to check up on him.

## 2014-09-08 NOTE — Op Note (Signed)
Preoperative diagnosis: Elevated PSA  Postoperative diagnosis: Elevated PSA  Procedure: Transrectal ultrasound of the prostate and prostate needle biopsy under trans-ultrasound guidance.  Surgeon: Dr. Rolly SalterLester S Leiani Maldonado, Kurt HagemanJr.  Resident assistant: Dr. Almira BarWill Maldonado  Anesthesia: IV sedation  Complications: None  Specimens: Right lateral base, right base, right lateral mid, right mid, right lateral apex, right apex, left lateral base, left base, left lateral mid, left mid, left lateral apex, left apex of the prostate.  Disposition of specimens: To pathology  Indication: Mr. Kurt Maldonado is a 48 year old gentleman who was noted to have an elevated PSA and has elected to proceed with a prostate biopsy.  The potential risks, complications, expected recovery process, and alternative options have been discussed in detail and informed consent has been obtained.  Description of procedure: The patient was taken to the operating room and IV sedation was administered.  He was given preoperative antibiotics, placed in the left lateral decubitus position, and prepped in the usual sterile fashion.  He did receive a Fleet's enema prior to the procedure.  Transrectal ultrasound of the prostate was then performed using the transrectal probe. Sagittal and transverse images were reviewed.  Prostate volume was calculated at 40.9 cc.  Prostate length was 4.2 cm, prostate height was 3.4 cm, and prostate width was 5.5 cm.  The prostate appeared homogeneous without suspicious lesions.  There were no abnormalities noted of the seminal vesicles.  A total of 12 biopsy cores were then obtained using a sextant template with cores taken from the lateral and parasagittal regions of the base, mid, and apical regions of the prostate on both the right and left side.  All biopsy cores were placed in formalin and sent for permanent pathologic analysis.  The patient tolerated the procedure well and without complications.  Following removal of the  transrectal ultrasound probe, a rectal exam was performed and there was noted to be minimal bleeding.  He was able to be transferred to the recovery unit in satisfactory condition.

## 2014-09-08 NOTE — Interval H&P Note (Signed)
History and Physical Interval Note:  09/08/2014 2:57 PM  Kurt Maldonado  has presented today for surgery, with the diagnosis of ELEVATED PROSTATIC SPECIFIC ANTIGEN  The various methods of treatment have been discussed with the patient and family. After consideration of risks, benefits and other options for treatment, the patient has consented to  Procedure(s): BIOPSY TRANSRECTAL ULTRASONIC PROSTATE (TUBP) (N/A) as a surgical intervention .  The patient's history has been reviewed, patient examined, no change in status, stable for surgery.  I have reviewed the patient's chart and labs.  Questions were answered to the patient's satisfaction.     Jimi Giza,LES

## 2014-09-09 ENCOUNTER — Encounter (HOSPITAL_COMMUNITY): Payer: Self-pay | Admitting: Urology

## 2014-10-27 ENCOUNTER — Ambulatory Visit: Payer: Self-pay | Admitting: Family Medicine

## 2015-07-19 ENCOUNTER — Other Ambulatory Visit: Payer: Self-pay | Admitting: Family Medicine

## 2015-07-20 NOTE — Telephone Encounter (Signed)
controlled 

## 2015-08-26 ENCOUNTER — Other Ambulatory Visit: Payer: Self-pay | Admitting: Family Medicine

## 2015-09-15 ENCOUNTER — Ambulatory Visit: Payer: BLUE CROSS/BLUE SHIELD | Attending: Neurology

## 2015-09-15 DIAGNOSIS — G4733 Obstructive sleep apnea (adult) (pediatric): Secondary | ICD-10-CM | POA: Diagnosis not present

## 2015-09-15 DIAGNOSIS — G4761 Periodic limb movement disorder: Secondary | ICD-10-CM | POA: Insufficient documentation

## 2015-10-06 ENCOUNTER — Ambulatory Visit
Admission: RE | Admit: 2015-10-06 | Discharge: 2015-10-06 | Disposition: A | Discharge status: Home / Self Care | Payer: BLUE CROSS/BLUE SHIELD | Source: Ambulatory Visit | Attending: Family Medicine | Admitting: Family Medicine

## 2015-10-06 ENCOUNTER — Encounter: Payer: Self-pay | Admitting: Family Medicine

## 2015-10-06 ENCOUNTER — Ambulatory Visit (INDEPENDENT_AMBULATORY_CARE_PROVIDER_SITE_OTHER): Payer: BLUE CROSS/BLUE SHIELD | Admitting: Family Medicine

## 2015-10-06 VITALS — BP 112/74 | HR 62 | Temp 97.7°F | Resp 16 | Wt 220.0 lb

## 2015-10-06 DIAGNOSIS — R079 Chest pain, unspecified: Secondary | ICD-10-CM | POA: Diagnosis not present

## 2015-10-06 MED ORDER — HYDROCODONE-ACETAMINOPHEN 5-325 MG PO TABS: 1.0000 | ORAL_TABLET | Freq: Four times a day (QID) | ORAL | Status: DC | PRN

## 2015-10-06 MED ORDER — NAPROXEN 500 MG PO TABS
500.0000 mg | ORAL_TABLET | Freq: Two times a day (BID) | ORAL | Status: DC
Start: 2015-10-06 — End: 2015-11-25

## 2015-10-06 MED ORDER — OMEPRAZOLE 20 MG PO CPDR: 20.0000 mg | DELAYED_RELEASE_CAPSULE | Freq: Every morning | ORAL | Status: DC

## 2015-10-06 NOTE — Progress Notes (Signed)
Patient ID: Kurt Maldonado, male   DOB: 1966/08/05, 50 y.o.   MRN: [id number]    Subjective:  HPI Pt is here for chest pain. He reports that this has been going on for at least 2 weeks. He say he may have a little shortness of breath but nothing severe. He reports that laying down sometimes will make it better. He has a history of pneumothorax and reports this feels similar to that. He says it feels like something is sticking him in the left side of his lower chest. No sweats, nausea, arm pain or jaw pain. He has been more tired than normal.  No exertional pain whatsoever. Prior to Admission medications   Medication Sig Start Date End Date Taking? Authorizing Provider  oxymetazoline (AFRIN) 0.05 % nasal spray Place 2 sprays into both nostrils 2 (two) times daily as needed for congestion.   Yes Historical Provider, MD  sertraline (ZOLOFT) 100 MG tablet TAKE 1 TABLET BY MOUTH EVERY DAY Patient taking differently: 1/2 daily. He is only taking 50mg  daily 08/26/15  Yes Mieczyslaw Hulen Shouts., MD  triamcinolone cream (KENALOG) 0.1 % Apply 1 application topically daily as needed.  05/08/14   Historical Provider, MD  zolpidem (AMBIEN) 10 MG tablet TAKE 1 TABLET BY MOUTH EVERY DAY AT BEDTIME AS NEEDED FOR SLEEP 07/21/15   [name], MD    Patient Active Problem List   Diagnosis Date Noted  . Benign fibroma of prostate 07/31/2014  . Clinical depression 07/31/2014  . Acid reflux 07/31/2014  . Calculus of kidney 07/31/2014  . Headache, migraine 07/31/2014  . Neurocardiogenic syncope 07/31/2014  . Gastro-esophageal reflux disease without esophagitis 05/14/2014  . Bleeding per rectum 05/14/2014  . Beat, premature ventricular 04/08/2014  . Atrial fibrillation and flutter (HCC) 04/08/2014  . BP (high blood pressure) 03/25/2014  . Awareness of heartbeats 03/25/2014  . NS (nuclear sclerosis) 02/05/2014  . Atelectasis 08/04/1983    Past Medical History  Diagnosis Date  . Dysrhythmia    pvc's per pt on no medication   . Anxiety   . GERD (gastroesophageal reflux disease)   . Arthritis     hand   . Syncopal episodes     08/2014 - followed by Dr Sherryll Burger at [place], abnormal EEG- - note 08/20/14 in Care Everywhere )    . Collapsed lung     left lung- 1984     Social History   Social History  . Marital Status: Married    Spouse Name: N/A  . Number of Children: N/A  . Years of Education: N/A   Occupational History  . Not on file.   Social History Main Topics  . Smoking status: Never Smoker   . Smokeless tobacco: Never Used  . Alcohol Use: Yes     Comment: rare- 2 beers per month   . Drug Use: No  . Sexual Activity: Not on file   Other Topics Concern  . Not on file   Social History Narrative    Allergies  Allergen Reactions  . Codeine Nausea And Vomiting and Other (See Comments)    Muscle tightness     Review of Systems  Constitutional: Positive for malaise/fatigue.  HENT: Negative.   Eyes: Negative.   Respiratory: Positive for shortness of breath.   Cardiovascular: Positive for chest pain and palpitations (nothing new).  Gastrointestinal: Negative.   Genitourinary: Negative.   Musculoskeletal: Negative.   Skin: Negative.   Neurological: Negative.   Endo/Heme/Allergies: Negative.  There is no immunization history on file for this patient. Objective:  BP 112/74 mmHg  Pulse 62  Temp(Src) 97.7 F (36.5 C) (Oral)  Resp 16  Wt 220 lb (99.791 kg)  SpO2 97%  Physical Exam  Constitutional: He is oriented to person, place, and time and well-developed, well-nourished, and in no distress.  Eyes: Conjunctivae and EOM are normal. Pupils are equal, round, and reactive to light.  Neck: Normal range of motion. Neck supple.  Cardiovascular: Normal rate, regular rhythm, normal heart sounds and intact distal pulses.   Pulmonary/Chest: Effort normal and breath sounds normal.  Tender on the ribs, anterior and post ribs   Abdominal: Soft. Bowel sounds  are normal.  Musculoskeletal: Normal range of motion.  Neurological: He is alert and oriented to person, place, and time. He has normal reflexes. Gait normal. GCS score is 15.  Skin: Skin is warm and dry.  Psychiatric: Mood, memory, affect and judgment normal.    Lab Results  Component Value Date   WBC 5.6 09/05/2014   HGB 13.2 09/05/2014   HCT 39.0 09/05/2014   PLT 278 09/05/2014   GLUCOSE 90 09/05/2014   INR 1.0 07/29/2014    CMP     Component Value Date/Time   NA 138 09/05/2014 1330   NA 139 07/29/2014 0758   K 4.3 09/05/2014 1330   K 4.0 07/29/2014 0758   CL 100 09/05/2014 1330   CL 105 07/29/2014 0758   CO2 25 09/05/2014 1330   CO2 27 07/29/2014 0758   GLUCOSE 90 09/05/2014 1330   GLUCOSE 105* 07/29/2014 0758   BUN 15 09/05/2014 1330   BUN 15 07/29/2014 0758   CREATININE 0.87 09/05/2014 1330   CREATININE 0.89 07/29/2014 0758   CALCIUM 9.6 09/05/2014 1330   CALCIUM 8.4* 07/29/2014 0758   PROT 7.0 07/29/2014 0758   ALBUMIN 3.8 07/29/2014 0758   AST 17 07/29/2014 0758   ALT 26 07/29/2014 0758   ALKPHOS 86 07/29/2014 0758   BILITOT 0.5 07/29/2014 0758   GFRNONAA >90 09/05/2014 1330   GFRNONAA >60 07/29/2014 0758   GFRAA >90 09/05/2014 1330   GFRAA >60 07/29/2014 0758    Assessment and Plan :  1. Chest pain, unspecified chest pain type Seems to be muskuloskeletal.Check for pneumothorax. - EKG 12-Lead - DG Chest 2 View; Future - DG Chest Special View; Future  - omeprazole (PRILOSEC) 20 MG capsule; Take 1 capsule (20 mg total) by mouth every morning. Reported on 10/06/2015  Dispense: 30 capsule; Refill: 12 - naproxen (NAPROSYN) 500 MG tablet; Take 1 tablet (500 mg total) by mouth 2 (two) times daily with a meal.  Dispense: 60 tablet; Refill: 1 - POCT urinalysis dipstick - HYDROcodone-acetaminophen (NORCO/VICODIN) 5-325 MG tablet; Take 1 tablet by mouth every 6 (six) hours as needed for moderate pain.  Dispense: 25 tablet; Refill: 0   Patient was seen and  examined by Dr. Julieanne Mansonichard Gilbert, and noted scribed by Dimas ChyleBrittany Byrd, CMA  Julieanne Mansonichard Gilbert MD [facility] 10/06/2015 12:06 PM

## 2015-10-16 ENCOUNTER — Ambulatory Visit
Admission: RE | Admit: 2015-10-16 | Discharge: 2015-10-16 | Disposition: A | Discharge status: Home / Self Care | Payer: BLUE CROSS/BLUE SHIELD | Source: Ambulatory Visit | Attending: Family Medicine | Admitting: Family Medicine

## 2015-10-16 ENCOUNTER — Other Ambulatory Visit: Payer: Self-pay | Admitting: Family Medicine

## 2015-10-16 DIAGNOSIS — R0789 Other chest pain: Secondary | ICD-10-CM | POA: Diagnosis not present

## 2015-10-16 MED ORDER — IOHEXOL 350 MG/ML SOLN
100.0000 mL | Freq: Once | INTRAVENOUS | Status: AC | PRN
  Administered 2015-10-16: 85 mL via INTRAVENOUS

## 2015-10-20 ENCOUNTER — Other Ambulatory Visit: Payer: Self-pay | Admitting: Emergency Medicine

## 2015-10-20 ENCOUNTER — Ambulatory Visit
Admission: RE | Admit: 2015-10-20 | Discharge: 2015-10-20 | Disposition: A | Discharge status: Home / Self Care | Payer: BLUE CROSS/BLUE SHIELD | Source: Ambulatory Visit | Attending: Family Medicine | Admitting: Family Medicine

## 2015-10-20 ENCOUNTER — Other Ambulatory Visit: Payer: Self-pay | Admitting: Family Medicine

## 2015-10-20 DIAGNOSIS — R079 Chest pain, unspecified: Secondary | ICD-10-CM | POA: Diagnosis present

## 2015-10-20 DIAGNOSIS — M549 Dorsalgia, unspecified: Secondary | ICD-10-CM | POA: Diagnosis present

## 2015-10-21 LAB — CBC WITH DIFFERENTIAL/PLATELET
Basophils Absolute: 0.1 10*3/uL (ref 0.0–0.2)
Basos: 1 %
EOS (ABSOLUTE): 0.2 10*3/uL (ref 0.0–0.4)
Eos: 3 %
Hematocrit: 43.7 % (ref 37.5–51.0)
Hemoglobin: 14.9 g/dL (ref 12.6–17.7)
Immature Grans (Abs): 0 10*3/uL (ref 0.0–0.1)
Immature Granulocytes: 0 %
LYMPHS ABS: 1.8 10*3/uL (ref 0.7–3.1)
LYMPHS: 25 %
MCH: 31.6 pg (ref 26.6–33.0)
MCHC: 34.1 g/dL (ref 31.5–35.7)
MCV: 93 fL (ref 79–97)
Monocytes Absolute: 0.9 10*3/uL (ref 0.1–0.9)
Monocytes: 13 %
NEUTROS ABS: 4.1 10*3/uL (ref 1.4–7.0)
Neutrophils: 58 %
PLATELETS: 280 10*3/uL (ref 150–379)
RBC: 4.71 x10E6/uL (ref 4.14–5.80)
RDW: 13.6 % (ref 12.3–15.4)
WBC: 7.1 10*3/uL (ref 3.4–10.8)

## 2015-10-21 LAB — COMPREHENSIVE METABOLIC PANEL
A/G RATIO: 1.7 (ref 1.1–2.5)
ALT: 25 IU/L (ref 0–44)
AST: 19 IU/L (ref 0–40)
Albumin: 4.4 g/dL (ref 3.5–5.5)
Alkaline Phosphatase: 89 IU/L (ref 39–117)
BILIRUBIN TOTAL: 0.4 mg/dL (ref 0.0–1.2)
BUN/Creatinine Ratio: 18 (ref 9–20)
BUN: 19 mg/dL (ref 6–24)
CHLORIDE: 101 mmol/L (ref 96–106)
CO2: 22 mmol/L (ref 18–29)
Calcium: 9.3 mg/dL (ref 8.7–10.2)
Creatinine, Ser: 1.05 mg/dL (ref 0.76–1.27)
GFR calc Af Amer: 95 mL/min/{1.73_m2} (ref >59)
GFR calc non Af Amer: 82 mL/min/{1.73_m2} (ref >59)
Globulin, Total: 2.6 g/dL (ref 1.5–4.5)
Glucose: 83 mg/dL (ref 65–99)
POTASSIUM: 4.5 mmol/L (ref 3.5–5.2)
Sodium: 141 mmol/L (ref 134–144)
Total Protein: 7 g/dL (ref 6.0–8.5)

## 2015-10-21 LAB — URINALYSIS
Bilirubin, UA: NEGATIVE
GLUCOSE, UA: NEGATIVE
KETONES UA: NEGATIVE
Leukocytes, UA: NEGATIVE
Nitrite, UA: NEGATIVE
Protein, UA: NEGATIVE
RBC, UA: NEGATIVE
SPEC GRAV UA: 1.016 (ref 1.005–1.030)
UUROB: 0.2 mg/dL (ref 0.2–1.0)
pH, UA: 6.5 (ref 5.0–7.5)

## 2015-10-21 LAB — LIPASE: Lipase: 49 U/L (ref 0–59)

## 2015-10-21 LAB — SEDIMENTATION RATE: Sed Rate: 2 mm/hr (ref 0–30)

## 2015-10-21 LAB — H. PYLORI ANTIBODY, IGG: H Pylori IgG: <0.9 U/mL (ref 0.0–0.8)

## 2015-10-23 ENCOUNTER — Other Ambulatory Visit: Payer: Self-pay | Admitting: Emergency Medicine

## 2015-10-23 MED ORDER — PREDNISONE 5 MG (21) PO TBPK: 5.0000 mg | ORAL_TABLET | Freq: Every day | ORAL | Status: DC

## 2015-10-27 ENCOUNTER — Ambulatory Visit: Payer: 59

## 2015-11-25 ENCOUNTER — Encounter: Payer: Self-pay | Admitting: Family Medicine

## 2015-11-25 ENCOUNTER — Ambulatory Visit (INDEPENDENT_AMBULATORY_CARE_PROVIDER_SITE_OTHER): Payer: BLUE CROSS/BLUE SHIELD | Admitting: Family Medicine

## 2015-11-25 VITALS — BP 104/66 | HR 76 | Temp 97.9°F | Resp 16 | Wt 216.0 lb

## 2015-11-25 DIAGNOSIS — R319 Hematuria, unspecified: Secondary | ICD-10-CM | POA: Diagnosis not present

## 2015-11-25 DIAGNOSIS — R5383 Other fatigue: Secondary | ICD-10-CM | POA: Diagnosis not present

## 2015-11-25 DIAGNOSIS — R10819 Abdominal tenderness, unspecified site: Secondary | ICD-10-CM | POA: Diagnosis not present

## 2015-11-25 DIAGNOSIS — R079 Chest pain, unspecified: Secondary | ICD-10-CM

## 2015-11-25 DIAGNOSIS — R339 Retention of urine, unspecified: Secondary | ICD-10-CM | POA: Diagnosis not present

## 2015-11-25 DIAGNOSIS — R634 Abnormal weight loss: Secondary | ICD-10-CM

## 2015-11-25 LAB — POCT URINALYSIS DIPSTICK
Bilirubin, UA: NEGATIVE
Glucose, UA: NEGATIVE
Ketones, UA: NEGATIVE
LEUKOCYTES UA: NEGATIVE
NITRITE UA: NEGATIVE
PH UA: 5
Protein, UA: NEGATIVE
RBC UA: NEGATIVE
Spec Grav, UA: 1.02
UROBILINOGEN UA: NEGATIVE

## 2015-11-25 LAB — POCT UA - MICROSCOPIC ONLY

## 2015-11-25 NOTE — Progress Notes (Signed)
Patient ID: Kurt Maldonado, male   DOB: 04-16-1966, 50 y.o.   MRN: [id number]    Subjective:  HPI  Patient is here for follow up. Patient was last seen in January for chest pain-he was given Naproxen, Norco and Omeprazole. He is still taking Omeprazole. He is not taking pain medications. He also had labs, xrays, CT and doppler at that time-results were normal. He states he has chest pain off and on. Saturday February 18th he developed a pain "but describes it as a funny sensation" radiating from his waist on the right side up to his head and it lasted for about 10 minutes. He did not have any other symptoms at that time.  He also noticed some blood in the urine the other day, yesterday he states it was better but cloudy and looked like there was something floating in there. He also states his energy has been low, has not been eating as much as usual- 4 lbs down from 1 month ago.  He mentions that he has been seen Dr. Sherryll Burger and wanted to make sure we had a record of his EEG and that Dr. Sherryll Burger thinks patient either has early Parkinsons or epilepsy episodes. It is not ruled out what is really going on 100%.  Prior to Admission medications   Medication Sig Start Date End Date Taking? Authorizing Provider  gabapentin (NEURONTIN) 300 MG capsule Take 300 mg by mouth 2 (two) times daily.  11/21/15  Yes Historical Provider, MD  omeprazole (PRILOSEC) 20 MG capsule Take 1 capsule (20 mg total) by mouth every morning. Reported on 10/06/2015 10/06/15  Yes Phoenix L Wendelyn Breslow., MD  oxymetazoline (AFRIN) 0.05 % nasal spray Place 2 sprays into both nostrils 2 (two) times daily as needed for congestion.   Yes Historical Provider, MD  sertraline (ZOLOFT) 100 MG tablet TAKE 1 TABLET BY MOUTH EVERY DAY Patient taking differently: 1/2 daily. He is only taking 50mg  daily 08/26/15  Yes Devron Hulen Shouts., MD  zolpidem (AMBIEN) 10 MG tablet TAKE 1 TABLET BY MOUTH EVERY DAY AT BEDTIME AS NEEDED FOR SLEEP 07/21/15  Yes  [name], MD    Patient Active Problem List   Diagnosis Date Noted  . Parkinson's disease (HCC) 08/20/2014  . Benign fibroma of prostate 07/31/2014  . Clinical depression 07/31/2014  . Acid reflux 07/31/2014  . Calculus of kidney 07/31/2014  . Headache, migraine 07/31/2014  . Neurocardiogenic syncope 07/31/2014  . Gastro-esophageal reflux disease without esophagitis 05/14/2014  . Bleeding per rectum 05/14/2014  . Beat, premature ventricular 04/08/2014  . Atrial fibrillation and flutter (HCC) 04/08/2014  . BP (high blood pressure) 03/25/2014  . Awareness of heartbeats 03/25/2014  . NS (nuclear sclerosis) 02/05/2014  . Atelectasis 08/04/1983    Past Medical History  Diagnosis Date  . Dysrhythmia     pvc's per pt on no medication   . Anxiety   . GERD (gastroesophageal reflux disease)   . Arthritis     hand   . Syncopal episodes     08/2014 - followed by Dr Sherryll Burger at [place], abnormal EEG- - note 08/20/14 in Care Everywhere )    . Collapsed lung     left lung- 1984     Social History   Social History  . Marital Status: Married    Spouse Name: N/A  . Number of Children: N/A  . Years of Education: N/A   Occupational History  . Not on file.   Social History Main  Topics  . Smoking status: Never Smoker   . Smokeless tobacco: Never Used  . Alcohol Use: Yes     Comment: rare- 2 beers per month   . Drug Use: No  . Sexual Activity: Not on file   Other Topics Concern  . Not on file   Social History Narrative    Allergies  Allergen Reactions  . Codeine Nausea And Vomiting and Other (See Comments)    Muscle tightness     Review of Systems  Constitutional: Positive for weight loss and malaise/fatigue.  HENT: Negative.   Respiratory: Negative.   Cardiovascular: Negative.   Gastrointestinal: Negative.   Genitourinary: Positive for hematuria.  Musculoskeletal: Positive for back pain and joint pain.  Neurological: Positive for tremors and weakness.  Negative for dizziness and tingling.  Psychiatric/Behavioral: Negative for depression and suicidal ideas. The patient is not nervous/anxious and does not have insomnia.      There is no immunization history on file for this patient. Objective:  BP 104/66 mmHg  Pulse 76  Temp(Src) 97.9 F (36.6 C)  Resp 16  Wt 216 lb (97.977 kg)  Physical Exam  Constitutional: He is oriented to person, place, and time and well-developed, well-nourished, and in no distress. Vital signs are normal.  HENT:  Head: Normocephalic and atraumatic.  Mouth/Throat: Oropharynx is clear and moist.  Eyes: Conjunctivae are normal. Pupils are equal, round, and reactive to light.  Neck: Normal range of motion. Neck supple.  Cardiovascular: Normal rate, regular rhythm, normal heart sounds and intact distal pulses.   No murmur heard. Pulmonary/Chest: Effort normal and breath sounds normal. No respiratory distress. He has no wheezes.  Abdominal: Soft. Bowel sounds are normal. He exhibits no mass. There is tenderness (diffuse tenderness, no mass effect). There is no rebound.  Neurological: He is alert and oriented to person, place, and time.  Skin: No rash noted. No erythema.  Psychiatric: Mood, memory, affect and judgment normal.    Lab Results  Component Value Date   WBC 7.1 10/20/2015   HGB 13.2 09/05/2014   HCT 43.7 10/20/2015   PLT 280 10/20/2015   GLUCOSE 83 10/20/2015   INR 1.0 07/29/2014    CMP     Component Value Date/Time   NA 141 10/20/2015 1416   NA 138 09/05/2014 1330   NA 139 07/29/2014 0758   K 4.5 10/20/2015 1416   K 4.0 07/29/2014 0758   CL 101 10/20/2015 1416   CL 105 07/29/2014 0758   CO2 22 10/20/2015 1416   CO2 27 07/29/2014 0758   GLUCOSE 83 10/20/2015 1416   GLUCOSE 90 09/05/2014 1330   GLUCOSE 105* 07/29/2014 0758   BUN 19 10/20/2015 1416   BUN 15 09/05/2014 1330   BUN 15 07/29/2014 0758   CREATININE 1.05 10/20/2015 1416   CREATININE 0.89 07/29/2014 0758   CALCIUM 9.3  10/20/2015 1416   CALCIUM 8.4* 07/29/2014 0758   PROT 7.0 10/20/2015 1416   PROT 7.0 07/29/2014 0758   ALBUMIN 4.4 10/20/2015 1416   ALBUMIN 3.8 07/29/2014 0758   AST 19 10/20/2015 1416   AST 17 07/29/2014 0758   ALT 25 10/20/2015 1416   ALT 26 07/29/2014 0758   ALKPHOS 89 10/20/2015 1416   ALKPHOS 86 07/29/2014 0758   BILITOT 0.4 10/20/2015 1416   BILITOT 0.5 07/29/2014 0758   GFRNONAA 82 10/20/2015 1416   GFRNONAA >60 07/29/2014 0758   GFRAA 95 10/20/2015 1416   GFRAA >60 07/29/2014 0758    Assessment and  Plan :  1. Chest pain, unspecified chest pain type Off and on. Still taking Omeprazole-continue current regimen. Follow. Patient has had multiple tests done.  2. Other fatigue Will check labs. 3. Urinary retention Following urologist. He had some hematuria in the last few days. UA is normal today. He may have passed a kidney stone.  4. Weight loss, unintentional Patient has not been trying to loose weight. 4 lbs weight loss in 1 month. Follow. May need further work up. 5. Abdominal tenderness New. Diffuse tenderness of the abdomen on the exam today. Last colonoscopy and endoscopy were done in August 2015 by Dr. Shelle Iron. May need to refer patient back. Patient also had CT of the abdomen last month.Microscopic  and dipped urine both normal today. 6.Patient is having small lapses in his memory/possible seizure disorder . Encouraged patient to make and follow up with Dr. Sherryll Burger and address this, my concern if this happens when he is driving.,  Patient was seen and examined by Dr. Bosie Clos and note was scribed by Samara Deist, RMA.  I have done the exam and reviewed the above chart and it is accurate to the best of my knowledge.   Julieanne Manson MD [facility] 11/25/2015 9:39 AM

## 2015-11-26 LAB — CBC WITH DIFFERENTIAL/PLATELET
Basophils Absolute: 0.1 10*3/uL (ref 0.0–0.2)
Basos: 1 %
EOS (ABSOLUTE): 0.4 10*3/uL (ref 0.0–0.4)
EOS: 6 %
HEMATOCRIT: 44.8 % (ref 37.5–51.0)
HEMOGLOBIN: 15 g/dL (ref 12.6–17.7)
Immature Grans (Abs): 0 10*3/uL (ref 0.0–0.1)
Immature Granulocytes: 0 %
LYMPHS ABS: 1.8 10*3/uL (ref 0.7–3.1)
Lymphs: 28 %
MCH: 31.6 pg (ref 26.6–33.0)
MCHC: 33.5 g/dL (ref 31.5–35.7)
MCV: 95 fL (ref 79–97)
Monocytes Absolute: 0.5 10*3/uL (ref 0.1–0.9)
Monocytes: 8 %
NEUTROS PCT: 57 %
Neutrophils Absolute: 3.6 10*3/uL (ref 1.4–7.0)
Platelets: 291 10*3/uL (ref 150–379)
RBC: 4.74 x10E6/uL (ref 4.14–5.80)
RDW: 13.4 % (ref 12.3–15.4)
WBC: 6.4 10*3/uL (ref 3.4–10.8)

## 2015-11-26 LAB — COMPREHENSIVE METABOLIC PANEL
ALK PHOS: 81 IU/L (ref 39–117)
ALT: 14 IU/L (ref 0–44)
AST: 18 IU/L (ref 0–40)
Albumin/Globulin Ratio: 1.7 (ref 1.1–2.5)
Albumin: 4.4 g/dL (ref 3.5–5.5)
BILIRUBIN TOTAL: 0.6 mg/dL (ref 0.0–1.2)
BUN / CREAT RATIO: 14 (ref 9–20)
BUN: 14 mg/dL (ref 6–24)
CHLORIDE: 102 mmol/L (ref 96–106)
CO2: 25 mmol/L (ref 18–29)
Calcium: 9.3 mg/dL (ref 8.7–10.2)
Creatinine, Ser: 0.99 mg/dL (ref 0.76–1.27)
GFR calc Af Amer: 102 mL/min/{1.73_m2} (ref >59)
GFR calc non Af Amer: 88 mL/min/{1.73_m2} (ref >59)
GLUCOSE: 90 mg/dL (ref 65–99)
Globulin, Total: 2.6 g/dL (ref 1.5–4.5)
Potassium: 4.7 mmol/L (ref 3.5–5.2)
Sodium: 142 mmol/L (ref 134–144)
Total Protein: 7 g/dL (ref 6.0–8.5)

## 2015-11-26 LAB — SEDIMENTATION RATE: SED RATE: 2 mm/h (ref 0–30)

## 2015-11-26 LAB — LIPASE: Lipase: 38 U/L (ref 0–59)

## 2015-11-26 LAB — H. PYLORI ANTIBODY, IGG

## 2015-12-08 ENCOUNTER — Other Ambulatory Visit: Payer: Self-pay

## 2015-12-08 DIAGNOSIS — R110 Nausea: Secondary | ICD-10-CM

## 2015-12-10 ENCOUNTER — Ambulatory Visit
Admission: RE | Admit: 2015-12-10 | Discharge: 2015-12-10 | Disposition: A | Discharge status: Home / Self Care | Payer: BLUE CROSS/BLUE SHIELD | Source: Ambulatory Visit | Attending: Family Medicine | Admitting: Family Medicine

## 2015-12-10 DIAGNOSIS — R110 Nausea: Secondary | ICD-10-CM | POA: Diagnosis present

## 2015-12-10 DIAGNOSIS — R10819 Abdominal tenderness, unspecified site: Secondary | ICD-10-CM

## 2015-12-10 DIAGNOSIS — R634 Abnormal weight loss: Secondary | ICD-10-CM

## 2015-12-15 ENCOUNTER — Telehealth: Payer: Self-pay | Admitting: Family Medicine

## 2015-12-15 NOTE — Telephone Encounter (Signed)
[facility]  ZO#[phone]/WJCB#[phone]/MW

## 2015-12-16 NOTE — Telephone Encounter (Signed)
Pt informed and voiced understanding of results. 

## 2015-12-16 NOTE — Telephone Encounter (Signed)
Pt returned your call ° °teri °

## 2015-12-16 NOTE — Telephone Encounter (Signed)
LMTCB regarding us results  ?

## 2015-12-17 ENCOUNTER — Other Ambulatory Visit: Payer: Self-pay | Admitting: Emergency Medicine

## 2015-12-17 DIAGNOSIS — R634 Abnormal weight loss: Secondary | ICD-10-CM

## 2015-12-17 DIAGNOSIS — R110 Nausea: Secondary | ICD-10-CM

## 2015-12-17 DIAGNOSIS — R10819 Abdominal tenderness, unspecified site: Secondary | ICD-10-CM

## 2015-12-25 ENCOUNTER — Ambulatory Visit
Admission: RE | Admit: 2015-12-25 | Discharge: 2015-12-25 | Disposition: A | Discharge status: Home / Self Care | Payer: BLUE CROSS/BLUE SHIELD | Source: Ambulatory Visit | Attending: Family Medicine | Admitting: Family Medicine

## 2015-12-25 DIAGNOSIS — R634 Abnormal weight loss: Secondary | ICD-10-CM | POA: Diagnosis present

## 2015-12-25 DIAGNOSIS — R110 Nausea: Secondary | ICD-10-CM | POA: Diagnosis present

## 2015-12-25 DIAGNOSIS — R10819 Abdominal tenderness, unspecified site: Secondary | ICD-10-CM

## 2015-12-25 MED ORDER — TECHNETIUM TC 99M MEBROFENIN IV KIT
5.0000 | PACK | Freq: Once | INTRAVENOUS | Status: AC | PRN
  Administered 2015-12-25: 4.85 via INTRAVENOUS

## 2015-12-25 MED ORDER — SINCALIDE 5 MCG IJ SOLR
0.0200 ug/kg | Freq: Once | INTRAMUSCULAR | Status: AC
  Administered 2015-12-25: 1.96 ug via INTRAVENOUS

## 2016-01-22 DIAGNOSIS — E0500 Thyrotoxicosis with diffuse goiter without thyrotoxic crisis or storm: Secondary | ICD-10-CM | POA: Diagnosis not present

## 2016-01-22 DIAGNOSIS — H0520 Unspecified exophthalmos: Secondary | ICD-10-CM | POA: Diagnosis not present

## 2016-01-25 ENCOUNTER — Ambulatory Visit: Payer: BLUE CROSS/BLUE SHIELD | Admitting: Family Medicine

## 2016-02-01 ENCOUNTER — Other Ambulatory Visit: Payer: Self-pay | Admitting: Family Medicine

## 2016-02-02 NOTE — Telephone Encounter (Signed)
What is refill for?

## 2016-02-04 NOTE — Telephone Encounter (Signed)
Spoke with patient he states Dr .Chestine Sporelark wrote this RX he thought. Patient states he is having swelling on his uvula again and said this medication was given to him last time to clear this issue up. Please review and let patient know-aa

## 2016-02-08 DIAGNOSIS — R200 Anesthesia of skin: Secondary | ICD-10-CM | POA: Diagnosis not present

## 2016-02-08 DIAGNOSIS — G2581 Restless legs syndrome: Secondary | ICD-10-CM | POA: Diagnosis not present

## 2016-02-08 DIAGNOSIS — G4752 REM sleep behavior disorder: Secondary | ICD-10-CM | POA: Diagnosis not present

## 2016-02-08 DIAGNOSIS — G5602 Carpal tunnel syndrome, left upper limb: Secondary | ICD-10-CM | POA: Diagnosis not present

## 2016-02-15 DIAGNOSIS — M5134 Other intervertebral disc degeneration, thoracic region: Secondary | ICD-10-CM | POA: Diagnosis not present

## 2016-02-15 DIAGNOSIS — M9903 Segmental and somatic dysfunction of lumbar region: Secondary | ICD-10-CM | POA: Diagnosis not present

## 2016-02-15 DIAGNOSIS — M5416 Radiculopathy, lumbar region: Secondary | ICD-10-CM | POA: Diagnosis not present

## 2016-02-15 DIAGNOSIS — M9902 Segmental and somatic dysfunction of thoracic region: Secondary | ICD-10-CM | POA: Diagnosis not present

## 2016-02-17 DIAGNOSIS — H0520 Unspecified exophthalmos: Secondary | ICD-10-CM | POA: Diagnosis not present

## 2016-02-17 DIAGNOSIS — R1310 Dysphagia, unspecified: Secondary | ICD-10-CM | POA: Diagnosis not present

## 2016-02-17 DIAGNOSIS — I493 Ventricular premature depolarization: Secondary | ICD-10-CM | POA: Diagnosis not present

## 2016-02-17 DIAGNOSIS — F4322 Adjustment disorder with anxiety: Secondary | ICD-10-CM | POA: Diagnosis not present

## 2016-02-22 ENCOUNTER — Other Ambulatory Visit: Payer: Self-pay | Admitting: Neurology

## 2016-02-22 DIAGNOSIS — M5417 Radiculopathy, lumbosacral region: Secondary | ICD-10-CM | POA: Diagnosis not present

## 2016-02-22 DIAGNOSIS — R200 Anesthesia of skin: Secondary | ICD-10-CM

## 2016-02-22 DIAGNOSIS — M545 Low back pain: Secondary | ICD-10-CM

## 2016-03-01 DIAGNOSIS — M9901 Segmental and somatic dysfunction of cervical region: Secondary | ICD-10-CM | POA: Diagnosis not present

## 2016-03-01 DIAGNOSIS — M5413 Radiculopathy, cervicothoracic region: Secondary | ICD-10-CM | POA: Diagnosis not present

## 2016-03-01 DIAGNOSIS — M5412 Radiculopathy, cervical region: Secondary | ICD-10-CM | POA: Diagnosis not present

## 2016-03-01 DIAGNOSIS — M542 Cervicalgia: Secondary | ICD-10-CM | POA: Diagnosis not present

## 2016-03-02 DIAGNOSIS — M542 Cervicalgia: Secondary | ICD-10-CM | POA: Diagnosis not present

## 2016-03-02 DIAGNOSIS — M5413 Radiculopathy, cervicothoracic region: Secondary | ICD-10-CM | POA: Diagnosis not present

## 2016-03-02 DIAGNOSIS — M5412 Radiculopathy, cervical region: Secondary | ICD-10-CM | POA: Diagnosis not present

## 2016-03-02 DIAGNOSIS — M9901 Segmental and somatic dysfunction of cervical region: Secondary | ICD-10-CM | POA: Diagnosis not present

## 2016-03-04 DIAGNOSIS — R1310 Dysphagia, unspecified: Secondary | ICD-10-CM | POA: Diagnosis not present

## 2016-03-04 DIAGNOSIS — M9901 Segmental and somatic dysfunction of cervical region: Secondary | ICD-10-CM | POA: Diagnosis not present

## 2016-03-04 DIAGNOSIS — M5413 Radiculopathy, cervicothoracic region: Secondary | ICD-10-CM | POA: Diagnosis not present

## 2016-03-04 DIAGNOSIS — I493 Ventricular premature depolarization: Secondary | ICD-10-CM | POA: Diagnosis not present

## 2016-03-04 DIAGNOSIS — M542 Cervicalgia: Secondary | ICD-10-CM | POA: Diagnosis not present

## 2016-03-04 DIAGNOSIS — M5412 Radiculopathy, cervical region: Secondary | ICD-10-CM | POA: Diagnosis not present

## 2016-03-04 DIAGNOSIS — H0520 Unspecified exophthalmos: Secondary | ICD-10-CM | POA: Diagnosis not present

## 2016-03-10 ENCOUNTER — Ambulatory Visit
Admission: RE | Admit: 2016-03-10 | Discharge: 2016-03-10 | Disposition: A | Discharge status: Home / Self Care | Payer: BLUE CROSS/BLUE SHIELD | Source: Ambulatory Visit | Attending: Neurology | Admitting: Neurology

## 2016-03-10 DIAGNOSIS — R200 Anesthesia of skin: Secondary | ICD-10-CM | POA: Diagnosis not present

## 2016-03-10 DIAGNOSIS — G8929 Other chronic pain: Secondary | ICD-10-CM | POA: Insufficient documentation

## 2016-03-10 DIAGNOSIS — M545 Low back pain: Secondary | ICD-10-CM | POA: Insufficient documentation

## 2016-03-10 DIAGNOSIS — M5126 Other intervertebral disc displacement, lumbar region: Secondary | ICD-10-CM | POA: Diagnosis not present

## 2016-03-10 DIAGNOSIS — M47816 Spondylosis without myelopathy or radiculopathy, lumbar region: Secondary | ICD-10-CM | POA: Insufficient documentation

## 2016-03-10 DIAGNOSIS — M79604 Pain in right leg: Secondary | ICD-10-CM | POA: Diagnosis not present

## 2016-03-11 DIAGNOSIS — E049 Nontoxic goiter, unspecified: Secondary | ICD-10-CM | POA: Diagnosis not present

## 2016-03-11 DIAGNOSIS — F4322 Adjustment disorder with anxiety: Secondary | ICD-10-CM | POA: Diagnosis not present

## 2016-03-11 DIAGNOSIS — R1310 Dysphagia, unspecified: Secondary | ICD-10-CM | POA: Diagnosis not present

## 2016-03-11 DIAGNOSIS — I493 Ventricular premature depolarization: Secondary | ICD-10-CM | POA: Diagnosis not present

## 2016-03-11 DIAGNOSIS — H0520 Unspecified exophthalmos: Secondary | ICD-10-CM | POA: Diagnosis not present

## 2016-03-16 DIAGNOSIS — H04123 Dry eye syndrome of bilateral lacrimal glands: Secondary | ICD-10-CM | POA: Diagnosis not present

## 2016-03-16 DIAGNOSIS — H04551 Acquired stenosis of right nasolacrimal duct: Secondary | ICD-10-CM | POA: Diagnosis not present

## 2016-03-16 DIAGNOSIS — H02539 Eyelid retraction unspecified eye, unspecified lid: Secondary | ICD-10-CM | POA: Diagnosis not present

## 2016-03-16 DIAGNOSIS — H04201 Unspecified epiphora, right lacrimal gland: Secondary | ICD-10-CM | POA: Diagnosis not present

## 2016-04-01 ENCOUNTER — Other Ambulatory Visit: Payer: Self-pay | Admitting: Family Medicine

## 2016-04-01 DIAGNOSIS — H938X2 Other specified disorders of left ear: Secondary | ICD-10-CM | POA: Diagnosis not present

## 2016-04-01 DIAGNOSIS — G4700 Insomnia, unspecified: Secondary | ICD-10-CM

## 2016-04-01 DIAGNOSIS — L989 Disorder of the skin and subcutaneous tissue, unspecified: Secondary | ICD-10-CM | POA: Diagnosis not present

## 2016-04-01 DIAGNOSIS — Z85828 Personal history of other malignant neoplasm of skin: Secondary | ICD-10-CM | POA: Diagnosis not present

## 2016-04-01 DIAGNOSIS — Z9889 Other specified postprocedural states: Secondary | ICD-10-CM | POA: Diagnosis not present

## 2016-04-04 DIAGNOSIS — G4700 Insomnia, unspecified: Secondary | ICD-10-CM | POA: Insufficient documentation

## 2016-04-06 DIAGNOSIS — M9904 Segmental and somatic dysfunction of sacral region: Secondary | ICD-10-CM | POA: Diagnosis not present

## 2016-04-06 DIAGNOSIS — M9903 Segmental and somatic dysfunction of lumbar region: Secondary | ICD-10-CM | POA: Diagnosis not present

## 2016-04-06 DIAGNOSIS — M5418 Radiculopathy, sacral and sacrococcygeal region: Secondary | ICD-10-CM | POA: Diagnosis not present

## 2016-04-06 DIAGNOSIS — M5417 Radiculopathy, lumbosacral region: Secondary | ICD-10-CM | POA: Diagnosis not present

## 2016-04-07 ENCOUNTER — Encounter: Payer: Self-pay | Admitting: Family Medicine

## 2016-04-13 ENCOUNTER — Ambulatory Visit: Payer: 59

## 2016-04-13 DIAGNOSIS — M7712 Lateral epicondylitis, left elbow: Secondary | ICD-10-CM | POA: Diagnosis not present

## 2016-04-20 DIAGNOSIS — J310 Chronic rhinitis: Secondary | ICD-10-CM | POA: Diagnosis not present

## 2016-04-20 DIAGNOSIS — H04209 Unspecified epiphora, unspecified lacrimal gland: Secondary | ICD-10-CM | POA: Diagnosis not present

## 2016-04-20 DIAGNOSIS — Z6828 Body mass index (BMI) 28.0-28.9, adult: Secondary | ICD-10-CM | POA: Diagnosis not present

## 2016-04-22 DIAGNOSIS — R972 Elevated prostate specific antigen [PSA]: Secondary | ICD-10-CM | POA: Diagnosis not present

## 2016-04-22 DIAGNOSIS — N401 Enlarged prostate with lower urinary tract symptoms: Secondary | ICD-10-CM | POA: Diagnosis not present

## 2016-04-22 DIAGNOSIS — R3914 Feeling of incomplete bladder emptying: Secondary | ICD-10-CM | POA: Diagnosis not present

## 2016-04-28 ENCOUNTER — Encounter: Payer: Self-pay | Admitting: Family Medicine

## 2016-04-28 ENCOUNTER — Ambulatory Visit (INDEPENDENT_AMBULATORY_CARE_PROVIDER_SITE_OTHER): Payer: BLUE CROSS/BLUE SHIELD | Admitting: Family Medicine

## 2016-04-28 VITALS — BP 112/70 | HR 68 | Temp 97.6°F | Resp 16 | Ht 74.0 in | Wt 226.0 lb

## 2016-04-28 DIAGNOSIS — Z0000 Encounter for general adult medical examination without abnormal findings: Secondary | ICD-10-CM | POA: Diagnosis not present

## 2016-04-28 DIAGNOSIS — R5383 Other fatigue: Secondary | ICD-10-CM | POA: Diagnosis not present

## 2016-04-28 NOTE — Progress Notes (Signed)
Patient: Kurt Maldonado, Male    DOB: 1966-04-17, 50 y.o.   MRN: [id number] Visit Date: 04/28/2016  Today's Provider: Megan Mans, MD   Chief Complaint  Patient presents with  . Annual Exam    form filled out for work   Subjective:    Annual physical exam Kurt Maldonado is a 50 y.o. male who presents today for health maintenance and complete physical. He feels well. He reports he is not exercising, but plans to start a program today. He reports he is sleeping fairly well. Patient does snore and this is getting worse. New issue is decreased libido. Another new issue in the last couple of years is that the heat bothers patient. He does have mild decreased sensation in his feet. Some chronic fatigue.  ----------------------------------------------------------------- Colonoscopy- 05/27/14 tubular adenoma    Review of Systems  Constitutional: Negative.   HENT: Positive for congestion.   Eyes: Positive for discharge.  Respiratory: Negative.   Cardiovascular: Negative.   Gastrointestinal: Negative.   Endocrine: Positive for heat intolerance.  Genitourinary: Negative.   Musculoskeletal: Positive for neck stiffness.  Skin: Negative.   Allergic/Immunologic: Negative.   Neurological: Positive for numbness.  Hematological: Negative.   Psychiatric/Behavioral: Negative.     Social History      He  reports that he has never smoked. He has never used smokeless tobacco. He reports that he drinks alcohol. He reports that he does not use drugs.       Social History   Social History  . Marital status: Married    Spouse name: N/A  . Number of children: N/A  . Years of education: N/A   Social History Main Topics  . Smoking status: Never Smoker  . Smokeless tobacco: Never Used  . Alcohol use Yes     Comment: rare- 2 beers per month   . Drug use: No  . Sexual activity: Not Asked   Other Topics Concern  . None   Social History Narrative  . None    Past  Medical History:  Diagnosis Date  . Anxiety   . Arthritis    hand   . Collapsed lung    left lung- 1984   . Dysrhythmia    pvc's per pt on no medication   . GERD (gastroesophageal reflux disease)   . Syncopal episodes    08/2014 - followed by Dr Sherryll Burger at [place], abnormal EEG- - note 08/20/14 in Care Everywhere )       Patient Active Problem List   Diagnosis Date Noted  . Insomnia 04/04/2016  . Parkinson's disease (HCC) 08/20/2014  . Benign fibroma of prostate 07/31/2014  . Clinical depression 07/31/2014  . Acid reflux 07/31/2014  . Calculus of kidney 07/31/2014  . Headache, migraine 07/31/2014  . Neurocardiogenic syncope 07/31/2014  . Gastro-esophageal reflux disease without esophagitis 05/14/2014  . Bleeding per rectum 05/14/2014  . Beat, premature ventricular 04/08/2014  . Atrial fibrillation and flutter (HCC) 04/08/2014  . BP (high blood pressure) 03/25/2014  . Awareness of heartbeats 03/25/2014  . NS (nuclear sclerosis) 02/05/2014  . Atelectasis 08/04/1983    Past Surgical History:  Procedure Laterality Date  . APPENDECTOMY    . PROSTATE BIOPSY N/A 09/08/2014   Procedure: BIOPSY TRANSRECTAL ULTRASONIC PROSTATE (TUBP);  Surgeon: Heloise Purpura, MD;  Location: [facility];  Service: Urology;  Laterality: N/A;  . SHOULDER SURGERY     bilateral     Family History  Family Status  Relation Status  . Mother Deceased at age 57  . Father Alive  . Sister Alive  . Brother Alive  . Daughter Alive  . Sister Alive  . Daughter Alive        His family history includes Dementia in his mother; Hypertension in his father; Kidney cancer in his father.    Allergies  Allergen Reactions  . Codeine Nausea And Vomiting and Other (See Comments)    Muscle tightness     Current Meds  Medication Sig  . gabapentin (NEURONTIN) 300 MG capsule Take 600 mg by mouth daily.   Marland Kitchen omeprazole (PRILOSEC) 20 MG capsule Take 1 capsule (20 mg total) by mouth every morning. Reported on  10/06/2015  . oxymetazoline (AFRIN) 0.05 % nasal spray Place 2 sprays into both nostrils 2 (two) times daily as needed for congestion.  . sertraline (ZOLOFT) 100 MG tablet TAKE 1 TABLET BY MOUTH EVERY DAY (Patient taking differently: 1/2 daily. He is only taking 50mg  daily)  . zolpidem (AMBIEN) 10 MG tablet TAKE 1 TABLET BY MOUTH AT BEDTIME AS NEEDED FOR SLEEP    Patient Care Team: [name], MD as PCP - General (Family Medicine)     Objective:   Vitals: BP 112/70 (BP Location: Left Arm, Patient Position: Sitting, Cuff Size: Large)   Pulse 68   Temp 97.6 F (36.4 C) (Oral)   Resp 16   Ht 6\' 2"  (1.88 m)   Wt 226 lb (102.5 kg)   BMI 29.02 kg/m    Physical Exam  Constitutional: He is oriented to person, place, and time. He appears well-developed and well-nourished.  HENT:  Head: Normocephalic and atraumatic.  Right Ear: External ear normal.  Left Ear: External ear normal.  Nose: Nose normal.  Mouth/Throat: Oropharynx is clear and moist.  Eyes: Conjunctivae and EOM are normal. Pupils are equal, round, and reactive to light.  Neck: Normal range of motion. Neck supple.  Cardiovascular: Normal rate, regular rhythm, normal heart sounds and intact distal pulses.   Pulmonary/Chest: Effort normal and breath sounds normal.  Abdominal: Soft. Bowel sounds are normal.  Genitourinary: Prostate normal and penis normal.  Musculoskeletal: Normal range of motion.  Neurological: He is alert and oriented to person, place, and time. He has normal reflexes.  Skin: Skin is warm and dry.  There is appears to be a subungual hematoma under the left great toe.  Psychiatric: He has a normal mood and affect. His behavior is normal. Judgment and thought content normal.     Depression Screen PHQ 2/9 Scores 04/28/2016  PHQ - 2 Score 0      Assessment & Plan:     Routine Health Maintenance and Physical Exam  Exercise Activities and Dietary recommendations Goals    None        There is no immunization history on file for this patient.  Health Maintenance  Topic Date Due  . HIV Screening  12/28/1979  . TETANUS/TDAP  12/28/1983  . COLONOSCOPY  12/28/2014  . INFLUENZA VACCINE  11/24/2016 (Originally 05/03/2016)      Discussed health benefits of physical activity, and encouraged him to engage in regular exercise appropriate for his age and condition.    -------------------------------------------------------------------- 1. Annual physical exam  - CBC with Differential/Platelet - Lipid Panel With LDL/HDL Ratio - TSH - Comprehensive metabolic panel - POCT urinalysis dipstick  2. Other fatigue Patient has also been diagnosed with early Parkinson's by neurology and this could be the etiology  of all of his issues/complaints. - Testosterone,Free and Total  Patient was seen and examined by Dr. Julieanne Manson, and noted scribed by Dimas Chyle, CMA I have done the exam and reviewed the above chart and it is accurate to the best of my knowledge.   Jettson Wendelyn Breslow, MD  [facility]

## 2016-04-29 LAB — CBC WITH DIFFERENTIAL/PLATELET
BASOS ABS: 0 10*3/uL (ref 0.0–0.2)
BASOS: 1 %
EOS (ABSOLUTE): 0.2 10*3/uL (ref 0.0–0.4)
EOS: 4 %
HEMATOCRIT: 39.5 % (ref 37.5–51.0)
HEMOGLOBIN: 13.5 g/dL (ref 12.6–17.7)
IMMATURE GRANS (ABS): 0 10*3/uL (ref 0.0–0.1)
Immature Granulocytes: 0 %
LYMPHS ABS: 1.8 10*3/uL (ref 0.7–3.1)
LYMPHS: 31 %
MCH: 31 pg (ref 26.6–33.0)
MCHC: 34.2 g/dL (ref 31.5–35.7)
MCV: 91 fL (ref 79–97)
MONOCYTES: 10 %
Monocytes Absolute: 0.6 10*3/uL (ref 0.1–0.9)
NEUTROS ABS: 3.1 10*3/uL (ref 1.4–7.0)
Neutrophils: 54 %
Platelets: 262 10*3/uL (ref 150–379)
RBC: 4.36 x10E6/uL (ref 4.14–5.80)
RDW: 13.8 % (ref 12.3–15.4)
WBC: 5.8 10*3/uL (ref 3.4–10.8)

## 2016-04-29 LAB — COMPREHENSIVE METABOLIC PANEL
A/G RATIO: 1.8 (ref 1.2–2.2)
ALT: 21 IU/L (ref 0–44)
AST: 20 IU/L (ref 0–40)
Albumin: 4.4 g/dL (ref 3.5–5.5)
Alkaline Phosphatase: 83 IU/L (ref 39–117)
BUN/Creatinine Ratio: 18 (ref 9–20)
BUN: 16 mg/dL (ref 6–24)
Bilirubin Total: 0.6 mg/dL (ref 0.0–1.2)
CALCIUM: 9.4 mg/dL (ref 8.7–10.2)
CO2: 24 mmol/L (ref 18–29)
CREATININE: 0.88 mg/dL (ref 0.76–1.27)
Chloride: 102 mmol/L (ref 96–106)
GFR, EST AFRICAN AMERICAN: 115 mL/min/{1.73_m2} (ref >59)
GFR, EST NON AFRICAN AMERICAN: 99 mL/min/{1.73_m2} (ref >59)
Globulin, Total: 2.5 g/dL (ref 1.5–4.5)
Glucose: 88 mg/dL (ref 65–99)
POTASSIUM: 4.6 mmol/L (ref 3.5–5.2)
Sodium: 142 mmol/L (ref 134–144)
TOTAL PROTEIN: 6.9 g/dL (ref 6.0–8.5)

## 2016-04-29 LAB — LIPID PANEL WITH LDL/HDL RATIO
Cholesterol, Total: 199 mg/dL (ref 100–199)
HDL: 44 mg/dL (ref >39)
LDL Calculated: 141 mg/dL — ABNORMAL HIGH (ref 0–99)
LDl/HDL Ratio: 3.2 ratio units (ref 0.0–3.6)
TRIGLYCERIDES: 71 mg/dL (ref 0–149)
VLDL Cholesterol Cal: 14 mg/dL (ref 5–40)

## 2016-04-29 LAB — TESTOSTERONE,FREE AND TOTAL
Testosterone, Free: 9.1 pg/mL (ref 7.2–24.0)
Testosterone: 460 ng/dL (ref 264–916)

## 2016-04-29 LAB — TSH: TSH: 1.01 u[IU]/mL (ref 0.450–4.500)

## 2016-05-03 ENCOUNTER — Telehealth: Payer: Self-pay | Admitting: Emergency Medicine

## 2016-05-03 NOTE — Telephone Encounter (Signed)
Per Dr. Wonda Olds note on labs, pt was informed that his labs were ok including testosterone and his form for his employer has been faxed.

## 2016-06-15 ENCOUNTER — Other Ambulatory Visit: Payer: Self-pay | Admitting: Neurology

## 2016-06-15 DIAGNOSIS — R200 Anesthesia of skin: Secondary | ICD-10-CM

## 2016-06-15 DIAGNOSIS — G2581 Restless legs syndrome: Secondary | ICD-10-CM | POA: Diagnosis not present

## 2016-06-15 DIAGNOSIS — G4752 REM sleep behavior disorder: Secondary | ICD-10-CM | POA: Diagnosis not present

## 2016-06-15 DIAGNOSIS — R42 Dizziness and giddiness: Secondary | ICD-10-CM

## 2016-06-15 DIAGNOSIS — H538 Other visual disturbances: Secondary | ICD-10-CM

## 2016-06-16 ENCOUNTER — Ambulatory Visit
Admission: RE | Admit: 2016-06-16 | Discharge: 2016-06-16 | Disposition: A | Discharge status: Home / Self Care | Payer: BLUE CROSS/BLUE SHIELD | Source: Ambulatory Visit | Attending: Neurology | Admitting: Neurology

## 2016-06-16 DIAGNOSIS — R42 Dizziness and giddiness: Secondary | ICD-10-CM | POA: Insufficient documentation

## 2016-06-16 DIAGNOSIS — G2581 Restless legs syndrome: Secondary | ICD-10-CM | POA: Insufficient documentation

## 2016-06-16 DIAGNOSIS — R9082 White matter disease, unspecified: Secondary | ICD-10-CM | POA: Diagnosis not present

## 2016-06-16 DIAGNOSIS — R200 Anesthesia of skin: Secondary | ICD-10-CM

## 2016-06-16 DIAGNOSIS — H538 Other visual disturbances: Secondary | ICD-10-CM | POA: Diagnosis not present

## 2016-06-16 DIAGNOSIS — R9089 Other abnormal findings on diagnostic imaging of central nervous system: Secondary | ICD-10-CM | POA: Diagnosis not present

## 2016-06-16 MED ORDER — GADOBENATE DIMEGLUMINE 529 MG/ML IV SOLN
20.0000 mL | Freq: Once | INTRAVENOUS | Status: AC | PRN
  Administered 2016-06-16: 20 mL via INTRAVENOUS

## 2016-07-28 ENCOUNTER — Other Ambulatory Visit: Payer: Self-pay

## 2016-07-28 MED ORDER — SERTRALINE HCL 50 MG PO TABS: 50.0000 mg | ORAL_TABLET | Freq: Every day | ORAL | 3 refills | Status: DC

## 2016-07-28 NOTE — Telephone Encounter (Signed)
Fax for refill request from [facility]

## 2016-07-29 DIAGNOSIS — Z6829 Body mass index (BMI) 29.0-29.9, adult: Secondary | ICD-10-CM | POA: Diagnosis not present

## 2016-07-29 DIAGNOSIS — H04209 Unspecified epiphora, unspecified lacrimal gland: Secondary | ICD-10-CM | POA: Diagnosis not present

## 2016-07-29 DIAGNOSIS — K219 Gastro-esophageal reflux disease without esophagitis: Secondary | ICD-10-CM | POA: Diagnosis not present

## 2016-07-29 DIAGNOSIS — G4733 Obstructive sleep apnea (adult) (pediatric): Secondary | ICD-10-CM | POA: Diagnosis not present

## 2016-07-29 DIAGNOSIS — Z01818 Encounter for other preprocedural examination: Secondary | ICD-10-CM | POA: Diagnosis not present

## 2016-07-29 DIAGNOSIS — I493 Ventricular premature depolarization: Secondary | ICD-10-CM | POA: Diagnosis not present

## 2016-08-01 DIAGNOSIS — I10 Essential (primary) hypertension: Secondary | ICD-10-CM | POA: Diagnosis not present

## 2016-08-01 DIAGNOSIS — I493 Ventricular premature depolarization: Secondary | ICD-10-CM | POA: Diagnosis not present

## 2016-08-01 DIAGNOSIS — K219 Gastro-esophageal reflux disease without esophagitis: Secondary | ICD-10-CM | POA: Diagnosis not present

## 2016-08-01 DIAGNOSIS — Z885 Allergy status to narcotic agent status: Secondary | ICD-10-CM | POA: Diagnosis not present

## 2016-08-01 DIAGNOSIS — H04209 Unspecified epiphora, unspecified lacrimal gland: Secondary | ICD-10-CM | POA: Diagnosis not present

## 2016-08-01 DIAGNOSIS — Z8249 Family history of ischemic heart disease and other diseases of the circulatory system: Secondary | ICD-10-CM | POA: Diagnosis not present

## 2016-08-01 DIAGNOSIS — G2581 Restless legs syndrome: Secondary | ICD-10-CM | POA: Diagnosis not present

## 2016-08-01 DIAGNOSIS — E049 Nontoxic goiter, unspecified: Secondary | ICD-10-CM | POA: Diagnosis not present

## 2016-08-01 DIAGNOSIS — H04551 Acquired stenosis of right nasolacrimal duct: Secondary | ICD-10-CM | POA: Diagnosis not present

## 2016-08-01 DIAGNOSIS — F419 Anxiety disorder, unspecified: Secondary | ICD-10-CM | POA: Diagnosis not present

## 2016-08-01 DIAGNOSIS — G20 Parkinson's disease: Secondary | ICD-10-CM | POA: Diagnosis not present

## 2016-08-01 DIAGNOSIS — G4733 Obstructive sleep apnea (adult) (pediatric): Secondary | ICD-10-CM | POA: Diagnosis not present

## 2016-08-01 DIAGNOSIS — Z79899 Other long term (current) drug therapy: Secondary | ICD-10-CM | POA: Diagnosis not present

## 2016-08-12 DIAGNOSIS — J310 Chronic rhinitis: Secondary | ICD-10-CM | POA: Diagnosis not present

## 2016-08-15 DIAGNOSIS — G4752 REM sleep behavior disorder: Secondary | ICD-10-CM | POA: Diagnosis not present

## 2016-08-15 DIAGNOSIS — R200 Anesthesia of skin: Secondary | ICD-10-CM | POA: Diagnosis not present

## 2016-08-15 DIAGNOSIS — G2581 Restless legs syndrome: Secondary | ICD-10-CM | POA: Diagnosis not present

## 2016-08-15 DIAGNOSIS — M5417 Radiculopathy, lumbosacral region: Secondary | ICD-10-CM | POA: Diagnosis not present

## 2016-08-29 ENCOUNTER — Ambulatory Visit (INDEPENDENT_AMBULATORY_CARE_PROVIDER_SITE_OTHER): Payer: BLUE CROSS/BLUE SHIELD | Admitting: Family Medicine

## 2016-08-29 VITALS — BP 108/66 | HR 72 | Temp 98.1°F | Resp 16 | Wt 231.0 lb

## 2016-08-29 DIAGNOSIS — J0100 Acute maxillary sinusitis, unspecified: Secondary | ICD-10-CM

## 2016-08-29 DIAGNOSIS — G4700 Insomnia, unspecified: Secondary | ICD-10-CM | POA: Diagnosis not present

## 2016-08-29 DIAGNOSIS — R5383 Other fatigue: Secondary | ICD-10-CM

## 2016-08-29 MED ORDER — ZOLPIDEM TARTRATE 10 MG PO TABS
10.0000 mg | ORAL_TABLET | Freq: Every evening | ORAL | 5 refills | Status: DC | PRN
Start: 2016-08-29 — End: 2017-03-28

## 2016-08-29 NOTE — Progress Notes (Signed)
Kurt PrestoRichard T Maldonado  MRN: [id number] DOB: 1966/08/09  Subjective:  HPI   The patient is a 50 year old male who presents for follow up acid reflux, depression, fatigue and insomnia.  He was last seen on 04/28/16 and no managment changes ere made at that time.  His labs done at that time were all within normal limits. He is currently taking Omeprazole for the acid reflux.  He is well controlled on the medicine.  He has never tried to stop it but states that when he has missed a dose he is able to tell and has increased symptoms.   The patient is also here to follow up on his depression He is currently taking Sertraline for the depression.  He was on 100 mg and state it was mainly for anxiety.  He decreased the dose about 6 months ago and still feels this is an adequate strength and his symptoms are being well controlled.  The patient had been taking Zolpidem for his insomnia.  He has not had any in several months and feels he is doing ok without it.  He states that about 2-3 nights per month he may have rouble sleeping and wold like to have some on hand for those occasions but otherwise he does not feel he needs to be taking it regularly.  The patient continues to have significant fatigue.  He states that is may even be worse.  It is also of note that he is currently being followed by neurology for his recently diagnosed early Parkinson's disease.  While patient ws being interviewed he rubbed his eye and his sinus stint came out partially.  His provider was called to the exam room and it was decided that the patient is to forgo today's visit with us in order for [facility] an effort to have this problem corrected.  We did go ahead and give the patient his prescription and lab slip while in the office.   Patient Active Problem List   Diagnosis Date Noted  . Insomnia 04/04/2016  . Parkinson's disease (HCC) 08/20/2014  . Benign fibroma of prostate 07/31/2014  . Clinical  depression 07/31/2014  . Acid reflux 07/31/2014  . Calculus of kidney 07/31/2014  . Headache, migraine 07/31/2014  . Neurocardiogenic syncope 07/31/2014  . Gastro-esophageal reflux disease without esophagitis 05/14/2014  . Bleeding per rectum 05/14/2014  . Beat, premature ventricular 04/08/2014  . Atrial fibrillation and flutter (HCC) 04/08/2014  . BP (high blood pressure) 03/25/2014  . Awareness of heartbeats 03/25/2014  . NS (nuclear sclerosis) 02/05/2014  . Atelectasis 08/04/1983    Past Medical History:  Diagnosis Date  . Anxiety   . Arthritis    hand   . Collapsed lung    left lung- 1984   . Dysrhythmia    pvc's per pt on no medication   . GERD (gastroesophageal reflux disease)   . Syncopal episodes    08/2014 - followed by [facility]ah at [facility], abnormal EEG- - note 08/20/14 in Care Everywhere )      Social History   Social History  . Marital status: Married    Spouse name: N/A  . Number of children: N/A  . Years of education: N/A   Occupational History  . Not on file.   Social History Main Topics  . Smoking status: Never Smoker  . Smokeless tobacco: Never Used  . Alcohol use Yes     Comment: rare- 2 beers per month   .  Drug use: No  . Sexual activity: Not on file   Other Topics Concern  . Not on file   Social History Narrative  . No narrative on file    Outpatient Encounter Prescriptions as of 08/29/2016  Medication Sig Note  . Ascorbic Acid (VITAMIN C) 1000 MG tablet Take by mouth. 08/29/2016: [facility] Received Sig: Take 1,000 mg by mouth once daily.  . cholecalciferol (VITAMIN D) 1000 units tablet Take 1,000 Units by mouth daily.   . Cyanocobalamin (RA VITAMIN B-12 TR) 1000 MCG TBCR Take by mouth. 08/29/2016: [facility] Received Sig: Take 1,000 mcg by mouth once daily.  Marland Kitchen. gabapentin (NEURONTIN) 300 MG capsule Take 600 mg by mouth daily.  11/25/2015: Received from: External Pharmacy    . omeprazole (PRILOSEC) 20 MG capsule Take 1 capsule (20 mg total) by mouth every morning. Reported on 10/06/2015   . oxymetazoline (AFRIN) 0.05 % nasal spray Place 2 sprays into both nostrils 2 (two) times daily as needed for congestion.   . sertraline (ZOLOFT) 50 MG tablet Take 1 tablet (50 mg total) by mouth daily.   Marland Kitchen. zolpidem (AMBIEN) 10 MG tablet Take 1 tablet (10 mg total) by mouth at bedtime as needed. for sleep   . [DISCONTINUED] zolpidem (AMBIEN) 10 MG tablet TAKE 1 TABLET BY MOUTH AT BEDTIME AS NEEDED FOR SLEEP    No facility-administered encounter medications on file as of 08/29/2016.     Allergies  Allergen Reactions  . Codeine Nausea And Vomiting and Other (See Comments)    Muscle tightness     Review of Systems  Constitutional: Negative.   HENT: Positive for congestion and sinus pain.   Respiratory: Negative.   Cardiovascular: Negative.   Endo/Heme/Allergies: Negative.   Psychiatric/Behavioral: Negative.     Objective:  BP 108/66 (BP Location: Right Arm, Patient Position: Sitting, Cuff Size: Normal)   Pulse 72   Temp 98.1 F (36.7 C) (Oral)   Resp 16   Wt 231 lb (104.8 kg)   BMI 29.66 kg/m   Physical Exam  Constitutional: He is oriented to person, place, and time and well-developed, well-nourished, and in no distress.  HENT:  Head: Normocephalic and atraumatic.  Right Ear: External ear normal.  Nose: Nose normal.  Eyes:  Stent that and placed in his right eye is now coming out. This was for his tear duct.  Neck: No thyromegaly present.  Cardiovascular: Normal rate, regular rhythm and normal heart sounds.   Pulmonary/Chest: Effort normal and breath sounds normal.  Neurological: He is alert and oriented to person, place, and time.  Skin: Skin is warm and dry.  Psychiatric: Mood, memory, affect and judgment normal.    Assessment and Plan :   1. Insomnia, unspecified type  - zolpidem (AMBIEN) 10 MG tablet; Take 1 tablet (10 mg total) by mouth at bedtime  as needed. for sleep  Dispense: 30 tablet; Refill: 5  2. Fatigue, unspecified type  - Testosterone - CBC with Differential/Platelet - TSH 3. Displacement of tear duct stent Patch placed on his eye and he is to go directly to the surgeon's office. HPI, Exam and A&P Transcribed under the direction and in the presence of Megan Mansichard Gilbert, Jr., MD. Electronically Signed: Janey GreaserElena DeSanto, RMA

## 2016-08-30 DIAGNOSIS — R5383 Other fatigue: Secondary | ICD-10-CM | POA: Diagnosis not present

## 2016-08-30 MED ORDER — AMOXICILLIN-POT CLAVULANATE 875-125 MG PO TABS: 1.0000 | ORAL_TABLET | Freq: Two times a day (BID) | ORAL | 0 refills | Status: DC

## 2016-08-31 LAB — CBC WITH DIFFERENTIAL/PLATELET
BASOS ABS: 0.1 10*3/uL (ref 0.0–0.2)
Basos: 1 %
EOS (ABSOLUTE): 0.3 10*3/uL (ref 0.0–0.4)
Eos: 4 %
Hematocrit: 41.9 % (ref 37.5–51.0)
Hemoglobin: 14.1 g/dL (ref 12.6–17.7)
IMMATURE GRANULOCYTES: 0 %
Immature Grans (Abs): 0 10*3/uL (ref 0.0–0.1)
LYMPHS ABS: 1.7 10*3/uL (ref 0.7–3.1)
Lymphs: 24 %
MCH: 30.9 pg (ref 26.6–33.0)
MCHC: 33.7 g/dL (ref 31.5–35.7)
MCV: 92 fL (ref 79–97)
MONOCYTES: 10 %
MONOS ABS: 0.7 10*3/uL (ref 0.1–0.9)
NEUTROS PCT: 61 %
Neutrophils Absolute: 4.4 10*3/uL (ref 1.4–7.0)
Platelets: 272 10*3/uL (ref 150–379)
RBC: 4.56 x10E6/uL (ref 4.14–5.80)
RDW: 14 % (ref 12.3–15.4)
WBC: 7.1 10*3/uL (ref 3.4–10.8)

## 2016-08-31 LAB — TSH: TSH: 0.755 u[IU]/mL (ref 0.450–4.500)

## 2016-08-31 LAB — TESTOSTERONE: TESTOSTERONE: 382 ng/dL (ref 264–916)

## 2016-09-02 DIAGNOSIS — R1310 Dysphagia, unspecified: Secondary | ICD-10-CM | POA: Diagnosis not present

## 2016-09-02 DIAGNOSIS — E049 Nontoxic goiter, unspecified: Secondary | ICD-10-CM | POA: Diagnosis not present

## 2016-09-02 DIAGNOSIS — I493 Ventricular premature depolarization: Secondary | ICD-10-CM | POA: Diagnosis not present

## 2016-09-02 DIAGNOSIS — H0520 Unspecified exophthalmos: Secondary | ICD-10-CM | POA: Diagnosis not present

## 2016-09-09 DIAGNOSIS — H0520 Unspecified exophthalmos: Secondary | ICD-10-CM | POA: Diagnosis not present

## 2016-09-09 DIAGNOSIS — E049 Nontoxic goiter, unspecified: Secondary | ICD-10-CM | POA: Diagnosis not present

## 2016-09-09 DIAGNOSIS — R1310 Dysphagia, unspecified: Secondary | ICD-10-CM | POA: Diagnosis not present

## 2016-09-12 DIAGNOSIS — Z9889 Other specified postprocedural states: Secondary | ICD-10-CM | POA: Diagnosis not present

## 2016-09-12 DIAGNOSIS — Z09 Encounter for follow-up examination after completed treatment for conditions other than malignant neoplasm: Secondary | ICD-10-CM | POA: Diagnosis not present

## 2016-09-14 DIAGNOSIS — Z23 Encounter for immunization: Secondary | ICD-10-CM | POA: Diagnosis not present

## 2016-09-16 DIAGNOSIS — G2581 Restless legs syndrome: Secondary | ICD-10-CM | POA: Diagnosis not present

## 2016-09-16 DIAGNOSIS — R419 Unspecified symptoms and signs involving cognitive functions and awareness: Secondary | ICD-10-CM | POA: Diagnosis not present

## 2016-09-16 DIAGNOSIS — G43009 Migraine without aura, not intractable, without status migrainosus: Secondary | ICD-10-CM | POA: Diagnosis not present

## 2016-09-16 DIAGNOSIS — R4189 Other symptoms and signs involving cognitive functions and awareness: Secondary | ICD-10-CM | POA: Diagnosis not present

## 2016-09-16 DIAGNOSIS — Z09 Encounter for follow-up examination after completed treatment for conditions other than malignant neoplasm: Secondary | ICD-10-CM | POA: Diagnosis not present

## 2016-09-16 DIAGNOSIS — J310 Chronic rhinitis: Secondary | ICD-10-CM | POA: Diagnosis not present

## 2016-09-20 ENCOUNTER — Other Ambulatory Visit: Payer: Self-pay | Admitting: Family Medicine

## 2016-09-20 DIAGNOSIS — R079 Chest pain, unspecified: Secondary | ICD-10-CM

## 2016-10-01 DIAGNOSIS — R05 Cough: Secondary | ICD-10-CM | POA: Diagnosis not present

## 2016-10-01 DIAGNOSIS — R110 Nausea: Secondary | ICD-10-CM | POA: Diagnosis not present

## 2016-10-04 DIAGNOSIS — M9903 Segmental and somatic dysfunction of lumbar region: Secondary | ICD-10-CM | POA: Diagnosis not present

## 2016-10-04 DIAGNOSIS — M5134 Other intervertebral disc degeneration, thoracic region: Secondary | ICD-10-CM | POA: Diagnosis not present

## 2016-10-04 DIAGNOSIS — M9902 Segmental and somatic dysfunction of thoracic region: Secondary | ICD-10-CM | POA: Diagnosis not present

## 2016-10-04 DIAGNOSIS — M5416 Radiculopathy, lumbar region: Secondary | ICD-10-CM | POA: Diagnosis not present

## 2016-10-24 DIAGNOSIS — I493 Ventricular premature depolarization: Secondary | ICD-10-CM | POA: Diagnosis not present

## 2016-10-24 DIAGNOSIS — R55 Syncope and collapse: Secondary | ICD-10-CM | POA: Diagnosis not present

## 2016-10-24 DIAGNOSIS — I10 Essential (primary) hypertension: Secondary | ICD-10-CM | POA: Diagnosis not present

## 2016-10-24 DIAGNOSIS — R079 Chest pain, unspecified: Secondary | ICD-10-CM | POA: Diagnosis not present

## 2016-10-26 DIAGNOSIS — R419 Unspecified symptoms and signs involving cognitive functions and awareness: Secondary | ICD-10-CM | POA: Diagnosis not present

## 2016-10-26 DIAGNOSIS — R4189 Other symptoms and signs involving cognitive functions and awareness: Secondary | ICD-10-CM | POA: Diagnosis not present

## 2016-10-26 DIAGNOSIS — R4689 Other symptoms and signs involving appearance and behavior: Secondary | ICD-10-CM | POA: Diagnosis not present

## 2016-10-28 DIAGNOSIS — R55 Syncope and collapse: Secondary | ICD-10-CM | POA: Diagnosis not present

## 2016-10-28 DIAGNOSIS — I10 Essential (primary) hypertension: Secondary | ICD-10-CM | POA: Diagnosis not present

## 2016-10-28 DIAGNOSIS — I493 Ventricular premature depolarization: Secondary | ICD-10-CM | POA: Diagnosis not present

## 2016-11-10 DIAGNOSIS — R419 Unspecified symptoms and signs involving cognitive functions and awareness: Secondary | ICD-10-CM | POA: Diagnosis not present

## 2016-12-07 DIAGNOSIS — G4752 REM sleep behavior disorder: Secondary | ICD-10-CM | POA: Diagnosis not present

## 2016-12-07 DIAGNOSIS — R419 Unspecified symptoms and signs involving cognitive functions and awareness: Secondary | ICD-10-CM | POA: Diagnosis not present

## 2016-12-07 DIAGNOSIS — G2581 Restless legs syndrome: Secondary | ICD-10-CM | POA: Diagnosis not present

## 2016-12-14 DIAGNOSIS — J310 Chronic rhinitis: Secondary | ICD-10-CM | POA: Diagnosis not present

## 2016-12-14 DIAGNOSIS — Z6829 Body mass index (BMI) 29.0-29.9, adult: Secondary | ICD-10-CM | POA: Diagnosis not present

## 2016-12-19 DIAGNOSIS — R110 Nausea: Secondary | ICD-10-CM | POA: Diagnosis not present

## 2016-12-19 DIAGNOSIS — G43719 Chronic migraine without aura, intractable, without status migrainosus: Secondary | ICD-10-CM | POA: Diagnosis not present

## 2017-01-17 DIAGNOSIS — S46812A Strain of other muscles, fascia and tendons at shoulder and upper arm level, left arm, initial encounter: Secondary | ICD-10-CM | POA: Diagnosis not present

## 2017-02-03 DIAGNOSIS — S46812D Strain of other muscles, fascia and tendons at shoulder and upper arm level, left arm, subsequent encounter: Secondary | ICD-10-CM | POA: Diagnosis not present

## 2017-02-06 DIAGNOSIS — S46012D Strain of muscle(s) and tendon(s) of the rotator cuff of left shoulder, subsequent encounter: Secondary | ICD-10-CM | POA: Diagnosis not present

## 2017-02-06 DIAGNOSIS — S46812D Strain of other muscles, fascia and tendons at shoulder and upper arm level, left arm, subsequent encounter: Secondary | ICD-10-CM | POA: Diagnosis not present

## 2017-02-20 ENCOUNTER — Ambulatory Visit (INDEPENDENT_AMBULATORY_CARE_PROVIDER_SITE_OTHER): Payer: BLUE CROSS/BLUE SHIELD | Admitting: Family Medicine

## 2017-02-20 ENCOUNTER — Encounter: Payer: Self-pay | Admitting: Family Medicine

## 2017-02-20 VITALS — BP 132/78 | HR 71 | Temp 98.1°F | Resp 16 | Wt 235.0 lb

## 2017-02-20 DIAGNOSIS — L089 Local infection of the skin and subcutaneous tissue, unspecified: Secondary | ICD-10-CM | POA: Diagnosis not present

## 2017-02-20 DIAGNOSIS — L723 Sebaceous cyst: Secondary | ICD-10-CM

## 2017-02-20 MED ORDER — HYDROCODONE-ACETAMINOPHEN 5-325 MG PO TABS: 1.0000 | ORAL_TABLET | ORAL | 0 refills | Status: DC | PRN

## 2017-02-20 NOTE — Progress Notes (Signed)
Patient: Kurt Maldonado Male    DOB: 09/27/1966   51 y.o.   MRN: [id number] Visit Date: 02/20/2017  Today's Provider: Megan Mans, MD   Chief Complaint  Patient presents with  . cyst on chest   Subjective:    HPI Patient comes in today for evaluation of an abcess. Patient reports that it is located on the upper right side of his chest. He states that he has been taking Doxycycline for his symptoms, and he has noticed a slight improvement. However, he states it is still painful. He also has redness, swelling, and it is raised above the skin.     Allergies  Allergen Reactions  . Codeine Nausea And Vomiting and Other (See Comments)    Muscle tightness      Current Outpatient Prescriptions:  .  Ascorbic Acid (VITAMIN C) 1000 MG tablet, Take by mouth., Disp: , Rfl:  .  cholecalciferol (VITAMIN D) 1000 units tablet, Take 1,000 Units by mouth daily., Disp: , Rfl:  .  Cyanocobalamin (RA VITAMIN B-12 TR) 1000 MCG TBCR, Take by mouth., Disp: , Rfl:  .  gabapentin (NEURONTIN) 300 MG capsule, Take 600 mg by mouth daily. , Disp: , Rfl:  .  omeprazole (PRILOSEC) 20 MG capsule, Take 1 capsule by mouth every morning., Disp: 30 capsule, Rfl: 9 .  oxymetazoline (AFRIN) 0.05 % nasal spray, Place 2 sprays into both nostrils 2 (two) times daily as needed for congestion., Disp: , Rfl:  .  sertraline (ZOLOFT) 50 MG tablet, Take 1 tablet (50 mg total) by mouth daily., Disp: 90 tablet, Rfl: 3 .  zolpidem (AMBIEN) 10 MG tablet, Take 1 tablet (10 mg total) by mouth at bedtime as needed. for sleep, Disp: 30 tablet, Rfl: 5 .  HYDROcodone-acetaminophen (NORCO/VICODIN) 5-325 MG tablet, Take 1 tablet by mouth every 4 (four) hours as needed for moderate pain., Disp: 25 tablet, Rfl: 0  Review of Systems  Constitutional: Positive for fatigue.  Eyes: Negative.   Respiratory: Negative.   Cardiovascular: Negative.   Gastrointestinal: Negative.   Endocrine: Negative.   Skin: Positive for color  change and wound.  Allergic/Immunologic: Negative.   Psychiatric/Behavioral: Negative.     Social History  Substance Use Topics  . Smoking status: Never Smoker  . Smokeless tobacco: Never Used  . Alcohol use Yes     Comment: rare- 2 beers per month    Objective:   BP 132/78 (BP Location: Left Arm, Patient Position: Sitting, Cuff Size: Large)   Pulse 71   Temp 98.1 F (36.7 C)   Resp 16   Wt 235 lb (106.6 kg)   SpO2 95%   BMI 30.17 kg/m  Vitals:   02/20/17 1146  BP: 132/78  Pulse: 71  Resp: 16  Temp: 98.1 F (36.7 C)  SpO2: 95%  Weight: 235 lb (106.6 kg)     Physical Exam  Constitutional: He is oriented to person, place, and time. He appears well-developed and well-nourished.  HENT:  Head: Normocephalic and atraumatic.  Eyes: Conjunctivae are normal. No scleral icterus.  Neck: No thyromegaly present.  Pulmonary/Chest: Effort normal and breath sounds normal.  Abdominal: Soft.  Neurological: He is alert and oriented to person, place, and time.  Skin: Skin is warm and dry.  About 6 inches of erythema noted lateral to the right nipple on the lateral chest wall. In the middle is about a 2-1/2 inch mass that is consistent with an infected sebaceous cyst. All of  this is very tender to the touch  Psychiatric: He has a normal mood and affect. His behavior is normal. Judgment and thought content normal.         Assessment & Plan:     1. Infected sebaceous cyst Area is prepped with Betadine and anesthesia with lidocaine with epinephrine. A 1 inch incision is made and a good bit of pus is expressed and also cultured.  - Ambulatory referral to General Surgery - Wound culture 2.Cellulitis Change Augmentin to doxycycline as he is not improved with the Augmentin, actually gotten worse.     I have done the exam and reviewed the above chart and it is accurate to the best of my knowledge. DentistDragon  technology has been used in this note in any air is in the dictation or  transcription are unintentional.  Megan Mansichard Belton Peplinski Jr, MD  [facility]

## 2017-02-21 ENCOUNTER — Encounter: Payer: Self-pay | Admitting: General Surgery

## 2017-02-21 ENCOUNTER — Ambulatory Visit (INDEPENDENT_AMBULATORY_CARE_PROVIDER_SITE_OTHER): Payer: BLUE CROSS/BLUE SHIELD | Admitting: General Surgery

## 2017-02-21 ENCOUNTER — Encounter: Payer: Self-pay | Admitting: Family Medicine

## 2017-02-21 ENCOUNTER — Ambulatory Visit (INDEPENDENT_AMBULATORY_CARE_PROVIDER_SITE_OTHER): Payer: BLUE CROSS/BLUE SHIELD | Admitting: Family Medicine

## 2017-02-21 VITALS — BP 140/80 | HR 74 | Resp 12 | Ht 74.0 in | Wt 223.0 lb

## 2017-02-21 VITALS — BP 110/70 | HR 71 | Temp 97.9°F | Wt 235.8 lb

## 2017-02-21 DIAGNOSIS — L02213 Cutaneous abscess of chest wall: Secondary | ICD-10-CM

## 2017-02-21 NOTE — Progress Notes (Signed)
Patient ID: Kurt Maldonado, male   DOB: 06-Apr-1966, 51 y.o.   MRN: [id number]  Chief Complaint  Patient presents with  . Other    HPI Kurt Maldonado is a 51 y.o. malehere today for a evaluation of a cyst on right upper chest wall. Two months ago he noticed this area.On Thursday he states the area was red and swollen. . Patient states he saw Dr. Sullivan Lone yesterday and they drained that area.                                                                     Marland KitchenHPI  Past Medical History:  Diagnosis Date  . Anxiety   . Arthritis    hand   . Collapsed lung    left lung- 1984   . Dysrhythmia    pvc's per pt on no medication   . GERD (gastroesophageal reflux disease)   . Syncopal episodes    08/2014 - followed by Dr Sherryll Burger at [place], abnormal EEG- - note 08/20/14 in Care Everywhere )      Past Surgical History:  Procedure Laterality Date  . APPENDECTOMY    . PROSTATE BIOPSY N/A 09/08/2014   Procedure: BIOPSY TRANSRECTAL ULTRASONIC PROSTATE (TUBP);  Surgeon: Heloise Purpura, MD;  Location: [facility];  Service: Urology;  Laterality: N/A;  . SHOULDER SURGERY     bilateral     Family History  Problem Relation Age of Onset  . Dementia Mother   . Hypertension Father   . Kidney cancer Father     Social History Social History  Substance Use Topics  . Smoking status: Never Smoker  . Smokeless tobacco: Never Used  . Alcohol use Yes     Comment: rare- 2 beers per month     Allergies  Allergen Reactions  . Codeine Nausea And Vomiting and Other (See Comments)    Muscle tightness     Current Outpatient Prescriptions  Medication Sig Dispense Refill  . Ascorbic Acid (VITAMIN C) 1000 MG tablet Take by mouth.    . cholecalciferol (VITAMIN D) 1000 units tablet Take 1,000 Units by mouth daily.    . Cyanocobalamin (RA VITAMIN B-12 TR) 1000 MCG TBCR Take by mouth.    . doxycycline (VIBRA-TABS) 100 MG tablet Take 100 mg by mouth 2 (two) times daily.  0  . gabapentin (NEURONTIN) 300 MG  capsule Take 600 mg by mouth daily.     Marland Kitchen HYDROcodone-acetaminophen (NORCO/VICODIN) 5-325 MG tablet Take 1 tablet by mouth every 4 (four) hours as needed for moderate pain. 25 tablet 0  . omeprazole (PRILOSEC) 20 MG capsule Take 1 capsule by mouth every morning. 30 capsule 9  . oxymetazoline (AFRIN) 0.05 % nasal spray Place 2 sprays into both nostrils 2 (two) times daily as needed for congestion.    . sertraline (ZOLOFT) 50 MG tablet Take 1 tablet (50 mg total) by mouth daily. 90 tablet 3  . zolpidem (AMBIEN) 10 MG tablet Take 1 tablet (10 mg total) by mouth at bedtime as needed. for sleep 30 tablet 5   No current facility-administered medications for this visit.     Review of Systems Review of Systems  Constitutional: Negative.   Respiratory: Negative.   Cardiovascular: Negative.  Blood pressure 140/80, pulse 74, resp. rate 12, height 6\' 2"  (1.88 m), weight 223 lb (101.2 kg).  Physical Exam Physical Exam  Constitutional: He is oriented to person, place, and time. He appears well-developed and well-nourished.  Neurological: He is alert and oriented to person, place, and time.  Skin:       Data Reviewed Notes reviewed   Assessment    Infected cyst right chest wall. Appears to be adequately drained    Plan   Patient to return in two days. Continue with Doxycycline  The patient is aware to use a heating pad as needed for comfort.  The patient is aware to call back for any questions or concerns.    HPI, Physical Exam, Assessment and Plan have been scribed under the direction and in the presence of Kathreen CosierS. G. Sankar, MD  Ples SpecterJessica Qualls, CMA  I have completed the exam and reviewed the above documentation for accuracy and completeness.  I agree with the above.  Museum/gallery conservatorDragon Technology has been used and any errors in dictation or transcription are unintentional.  Seeplaputhur G. Evette CristalSankar, M.D., F.A.C.S.    Gerlene BurdockSANKAR,SEEPLAPUTHUR G 02/22/2017, 10:11 AM

## 2017-02-21 NOTE — Progress Notes (Signed)
   Patient: Kurt PrestoRichard T Coull Male    DOB: September 28, 1966   51 y.o.   MRN: [id number] Visit Date: 02/21/2017  Today's Provider: Megan Mansichard Gilbert Jr, MD   Chief Complaint  Patient presents with  . Follow-up   Subjective:    HPI  Infected Sebaceous Cyst:  Patient is here today for a 1 day follow up. Cyst was drained yesterday. Patient states area is unchanged. He states cyst is still painful after I&D. He is taking Doxycycline and Hydrocodone PRN pain.  He initially had pain relief with the drainage of the abscess yesterday but it is worsened again. He feels the pain is getting worse down towards the lower lateral chest wall.   Previous Medications   ASCORBIC ACID (VITAMIN C) 1000 MG TABLET    Take by mouth.   CHOLECALCIFEROL (VITAMIN D) 1000 UNITS TABLET    Take 1,000 Units by mouth daily.   CYANOCOBALAMIN (RA VITAMIN B-12 TR) 1000 MCG TBCR    Take by mouth.   DOXYCYCLINE (VIBRA-TABS) 100 MG TABLET    Take 100 mg by mouth 2 (two) times daily.   GABAPENTIN (NEURONTIN) 300 MG CAPSULE    Take 600 mg by mouth daily.    HYDROCODONE-ACETAMINOPHEN (NORCO/VICODIN) 5-325 MG TABLET    Take 1 tablet by mouth every 4 (four) hours as needed for moderate pain.   OMEPRAZOLE (PRILOSEC) 20 MG CAPSULE    Take 1 capsule by mouth every morning.   OXYMETAZOLINE (AFRIN) 0.05 % NASAL SPRAY    Place 2 sprays into both nostrils 2 (two) times daily as needed for congestion.   SERTRALINE (ZOLOFT) 50 MG TABLET    Take 1 tablet (50 mg total) by mouth daily.   ZOLPIDEM (AMBIEN) 10 MG TABLET    Take 1 tablet (10 mg total) by mouth at bedtime as needed. for sleep    Review of Systems  Constitutional: Negative.   Skin: Positive for color change and wound.  Psychiatric/Behavioral: Negative.     Social History  Substance Use Topics  . Smoking status: Never Smoker  . Smokeless tobacco: Never Used  . Alcohol use Yes     Comment: rare- 2 beers per month    Objective:   BP 110/70 (BP Location: Left Arm, Patient  Position: Sitting, Cuff Size: Normal)   Pulse 71   Temp 97.9 F (36.6 C) (Oral)   Wt 235 lb 12.8 oz (107 kg)   SpO2 98%   BMI 30.27 kg/m   Physical Exam There is less erythema and there but there continues to be a good bit of tenderness in the central section. Again this appears to be a sebaceous cyst that has been infected. No axillary adenopathy     Assessment & Plan:     Cellulitis with local abscess. Probable infected sebaceous cyst Dr. Evette CristalSankar is kind enough to see the patient from surgery. Follow up: No Follow-up on file.     I have done the exam and reviewed the chart and it is accurate to the best of my knowledge. Dentist[employer]agon  technology has been used and  any errors in dictation or transcription are unintentional. Julieanne Mansonichard Gilbert M.D. [facility]

## 2017-02-21 NOTE — Patient Instructions (Signed)
Return in two days °

## 2017-02-22 LAB — WOUND CULTURE: ORGANISM ID, BACTERIA: NONE SEEN

## 2017-02-23 ENCOUNTER — Ambulatory Visit: Payer: BLUE CROSS/BLUE SHIELD | Admitting: Family Medicine

## 2017-02-23 ENCOUNTER — Encounter: Payer: Self-pay | Admitting: General Surgery

## 2017-02-23 ENCOUNTER — Ambulatory Visit (INDEPENDENT_AMBULATORY_CARE_PROVIDER_SITE_OTHER): Payer: BLUE CROSS/BLUE SHIELD | Admitting: General Surgery

## 2017-02-23 VITALS — BP 130/80 | HR 70 | Resp 16 | Ht 74.0 in | Wt 231.0 lb

## 2017-02-23 DIAGNOSIS — L02213 Cutaneous abscess of chest wall: Secondary | ICD-10-CM | POA: Diagnosis not present

## 2017-02-23 NOTE — Progress Notes (Signed)
Patient ID: Kurt Maldonado, male   DOB: 07/21/1966, 51 y.o.   MRN: [id number]  Chief Complaint  Patient presents with  . Routine Post Op    HPI Kurt PrestoRichard T Girtman is a 51 y.o. male.  Here today for follow up right chest abscess. He states he is doing well. Denies any pain, only tenderness. He states the area looks much better.  HPI  Past Medical History:  Diagnosis Date  . Anxiety   . Arthritis    hand   . Collapsed lung    left lung- 1984   . Dysrhythmia    pvc's per pt on no medication   . GERD (gastroesophageal reflux disease)   . Syncopal episodes    08/2014 - followed by [facility] at [facility], abnormal EEG- - note 08/20/14 in Care Everywhere )      Past Surgical History:  Procedure Laterality Date  . APPENDECTOMY    . PROSTATE BIOPSY N/A 09/08/2014   Procedure: BIOPSY TRANSRECTAL ULTRASONIC PROSTATE (TUBP);  Surgeon: Heloise PurpuraLester Borden, MD;  Location: [facility];  Service: Urology;  Laterality: N/A;  . SHOULDER SURGERY     bilateral     Family History  Problem Relation Age of Onset  . Dementia Mother   . Hypertension Father   . Kidney cancer Father     Social History Social History  Substance Use Topics  . Smoking status: Never Smoker  . Smokeless tobacco: Never Used  . Alcohol use Yes     Comment: rare- 2 beers per month     Allergies  Allergen Reactions  . Codeine Nausea And Vomiting and Other (See Comments)    Muscle tightness     Current Outpatient Prescriptions  Medication Sig Dispense Refill  . Ascorbic Acid (VITAMIN C) 1000 MG tablet Take by mouth.    . cholecalciferol (VITAMIN D) 1000 units tablet Take 1,000 Units by mouth daily.    . Cyanocobalamin (RA VITAMIN B-12 TR) 1000 MCG TBCR Take by mouth.    . doxycycline (VIBRA-TABS) 100 MG tablet Take 100 mg by mouth 2 (two) times daily.  0  . gabapentin (NEURONTIN) 300 MG capsule Take 600 mg by mouth daily.     Marland Kitchen. HYDROcodone-acetaminophen (NORCO/VICODIN) 5-325 MG tablet Take 1 tablet by mouth every 4  (four) hours as needed for moderate pain. 25 tablet 0  . omeprazole (PRILOSEC) 20 MG capsule Take 1 capsule by mouth every morning. 30 capsule 9  . oxymetazoline (AFRIN) 0.05 % nasal spray Place 2 sprays into both nostrils 2 (two) times daily as needed for congestion.    . sertraline (ZOLOFT) 50 MG tablet Take 1 tablet (50 mg total) by mouth daily. 90 tablet 3  . zolpidem (AMBIEN) 10 MG tablet Take 1 tablet (10 mg total) by mouth at bedtime as needed. for sleep 30 tablet 5   No current facility-administered medications for this visit.     Review of Systems Review of Systems  Constitutional: Negative.   Respiratory: Negative.   Cardiovascular: Negative.     Blood pressure 130/80, pulse 70, resp. rate 16, height 6\' 2"  (1.88 m), weight 231 lb (104.8 kg), SpO2 98 %.  Physical Exam Physical Exam  Constitutional: He is oriented to person, place, and time. He appears well-developed and well-nourished.  Pulmonary/Chest:    Neurological: He is alert and oriented to person, place, and time.  Skin: Skin is warm and dry.  Psychiatric: His behavior is normal.    Data Reviewed none  Assessment  Infected cyst right chest wall, redness and induration has markedly improved    Plan    Continue the antibiotics. Follow up in 6 weeks. The patient is aware to call back for any questions or concerns.     HPI, Physical Exam, Assessment and Plan have been scribed under the direction and in the presence of Kathreen Cosier, MD  Dorathy Daft, RN  I have completed the exam and reviewed the above documentation for accuracy and completeness.  I agree with the above.  Museum/gallery conservator has been used and any errors in dictation or transcription are unintentional.  Philana Younis G. Evette Cristal, M.D., F.A.C.S.   Gerlene Burdock G 02/23/2017, 11:40 AM

## 2017-02-23 NOTE — Patient Instructions (Signed)
The patient is aware to call back for any questions or concerns.  

## 2017-03-03 DIAGNOSIS — E049 Nontoxic goiter, unspecified: Secondary | ICD-10-CM | POA: Diagnosis not present

## 2017-03-03 DIAGNOSIS — H0520 Unspecified exophthalmos: Secondary | ICD-10-CM | POA: Diagnosis not present

## 2017-03-03 DIAGNOSIS — R1310 Dysphagia, unspecified: Secondary | ICD-10-CM | POA: Diagnosis not present

## 2017-03-03 DIAGNOSIS — I493 Ventricular premature depolarization: Secondary | ICD-10-CM | POA: Diagnosis not present

## 2017-03-03 DIAGNOSIS — F4322 Adjustment disorder with anxiety: Secondary | ICD-10-CM | POA: Diagnosis not present

## 2017-03-10 DIAGNOSIS — R1310 Dysphagia, unspecified: Secondary | ICD-10-CM | POA: Diagnosis not present

## 2017-03-10 DIAGNOSIS — E049 Nontoxic goiter, unspecified: Secondary | ICD-10-CM | POA: Diagnosis not present

## 2017-03-10 DIAGNOSIS — I493 Ventricular premature depolarization: Secondary | ICD-10-CM | POA: Diagnosis not present

## 2017-03-10 DIAGNOSIS — H0520 Unspecified exophthalmos: Secondary | ICD-10-CM | POA: Diagnosis not present

## 2017-03-27 ENCOUNTER — Encounter: Payer: Self-pay | Admitting: General Surgery

## 2017-03-27 ENCOUNTER — Ambulatory Visit (INDEPENDENT_AMBULATORY_CARE_PROVIDER_SITE_OTHER): Payer: BLUE CROSS/BLUE SHIELD | Admitting: General Surgery

## 2017-03-27 VITALS — BP 132/68 | HR 78 | Resp 14 | Ht 74.0 in | Wt 229.0 lb

## 2017-03-27 DIAGNOSIS — L02213 Cutaneous abscess of chest wall: Secondary | ICD-10-CM | POA: Diagnosis not present

## 2017-03-27 DIAGNOSIS — L720 Epidermal cyst: Secondary | ICD-10-CM | POA: Diagnosis not present

## 2017-03-27 NOTE — Patient Instructions (Signed)
Epidermal Cyst Removal, Care After Refer to this sheet in the next few weeks. These instructions provide you with information about caring for yourself after your procedure. Your health care provider may also give you more specific instructions. Your treatment has been planned according to current medical practices, but problems sometimes occur. Call your health care provider if you have any problems or questions after your procedure. What can I expect after the procedure? After the procedure, it is common to have:  Soreness in the area where your cyst was removed.  Tightness or itching from your skin sutures.  Follow these instructions at home:  Take medicines only as directed by your health care provider.  If you were prescribed an antibiotic medicine, finish all of it even if you start to feel better.  Use antibiotic ointment as directed by your health care provider. Follow the instructions carefully.  There are many different ways to close and cover an incision, including stitches (sutures), skin glue, and adhesive strips. Follow your health care provider's instructions about: ? Incision care. ? Bandage (dressing) changes and removal. ? Incision closure removal.  Keep the bandage (dressing) dry until your health care provider says that it can be removed. Take sponge baths only. Ask your health care provider when you can start showering or taking a bath.  After your dressing is off, check your incision every day for signs of infection. Watch for: ? Redness, swelling, or pain. ? Fluid, blood, or pus.  You can return to your normal activities. Do not do anything that stretches or puts pressure on your incision.  You can return to your normal diet.  Keep all follow-up visits as directed by your health care provider. This is important. Contact a health care provider if:  You have a fever.  Your incision bleeds.  You have redness, swelling, or pain in the incision area.  You  have fluid, blood, or pus coming from your incision.  Your cyst comes back after surgery. This information is not intended to replace advice given to you by your health care provider. Make sure you discuss any questions you have with your health care provider. Document Released: 10/10/2014 Document Revised: 02/25/2016 Document Reviewed: 06/04/2014 Elsevier Interactive Patient Education  2018 Elsevier Inc.  

## 2017-03-27 NOTE — Progress Notes (Signed)
Patient ID: Kurt Maldonado, male   DOB: 1966-09-09, 51 y.o.   MRN: [id number]  Chief Complaint  Patient presents with  . Follow-up    Chest wall cyst    HPI Kurt Maldonado is a 51 y.o. male here today for a 6 week follow up chest wall cyst. Patient states the cyst is much better. Completed his course of Doxycycline.  HPI  Past Medical History:  Diagnosis Date  . Anxiety   . Arthritis    hand   . Collapsed lung    left lung- 1984   . Dysrhythmia    pvc's per pt on no medication   . GERD (gastroesophageal reflux disease)   . Syncopal episodes    08/2014 - followed by Dr Sherryll Burger at [place], abnormal EEG- - note 08/20/14 in Care Everywhere )      Past Surgical History:  Procedure Laterality Date  . APPENDECTOMY    . PROSTATE BIOPSY N/A 09/08/2014   Procedure: BIOPSY TRANSRECTAL ULTRASONIC PROSTATE (TUBP);  Surgeon: Heloise Purpura, MD;  Location: [facility];  Service: Urology;  Laterality: N/A;  . SHOULDER SURGERY     bilateral     Family History  Problem Relation Age of Onset  . Dementia Mother   . Hypertension Father   . Kidney cancer Father     Social History Social History  Substance Use Topics  . Smoking status: Never Smoker  . Smokeless tobacco: Never Used  . Alcohol use Yes     Comment: rare- 2 beers per month     Allergies  Allergen Reactions  . Codeine Nausea And Vomiting and Other (See Comments)    Muscle tightness     Current Outpatient Prescriptions  Medication Sig Dispense Refill  . Ascorbic Acid (VITAMIN C) 1000 MG tablet Take by mouth.    . cholecalciferol (VITAMIN D) 1000 units tablet Take 1,000 Units by mouth daily.    . Cyanocobalamin (RA VITAMIN B-12 TR) 1000 MCG TBCR Take by mouth.    . gabapentin (NEURONTIN) 300 MG capsule Take 600 mg by mouth daily.     Marland Kitchen omeprazole (PRILOSEC) 20 MG capsule Take 1 capsule by mouth every morning. 30 capsule 9  . oxymetazoline (AFRIN) 0.05 % nasal spray Place 2 sprays into both nostrils 2 (two) times daily as  needed for congestion.    . sertraline (ZOLOFT) 50 MG tablet Take 1 tablet (50 mg total) by mouth daily. 90 tablet 3  . zolpidem (AMBIEN) 10 MG tablet Take 1 tablet (10 mg total) by mouth at bedtime as needed. for sleep 30 tablet 5   No current facility-administered medications for this visit.     Review of Systems Review of Systems  Constitutional: Negative.   Respiratory: Negative.   Cardiovascular: Negative.     Blood pressure 132/68, pulse 78, resp. rate 14, height 6\' 2"  (1.88 m), weight 229 lb (103.9 kg).  Physical Exam Physical Exam  Constitutional: He is oriented to person, place, and time. He appears well-developed and well-nourished.  Pulmonary/Chest: Effort normal.    Neurological: He is alert and oriented to person, place, and time.  Skin: Skin is warm and dry.  Psychiatric: He has a normal mood and affect. His behavior is normal.    Data Reviewed Prior notes reviewed.  Assessment    Cutaneous cyst of right chest- location  Right chest wall  Advised excision of the cyst. Pt agreeable.  Plan    Procedure name: Cutaneous cyst exicision Anaesthesia: 10 mL 0.5%  marcaine mixed with 1% lidocaine Prep: Chloro prep  Procedure: Area was prepped and draped. Site was injected with 10 mL mix of 0.5% marcaine and 1% lidocaine followed by a transverse elliptical incision. Full thickness cutaneous cyst was then removed and sent for pathology. Bleeding was controlled by cautery and then the site was closed using interrupted 4-0 prolene sutures. No immediate complications noted following procedure.  Follow up: Patient to return to clinic in 8-10 days for suture removal. Proper incision care discussed with patient, pt voiced understanding.     HPI, Physical Exam, Assessment and Plan have been scribed under the direction and in the presence of Kathreen CosierS. G. Valencia Kassa, MD.  Milas Kocherebeca Morris, CMA  I have completed the exam and reviewed the above documentation for accuracy and completeness.   I agree with the above.  Museum/gallery conservatorDragon Technology has been used and any errors in dictation or transcription are unintentional.  Janith Nielson G. Evette CristalSankar, M.D., F.A.C.S.   Gerlene BurdockSANKAR,Lorice Lafave Reece AgarG 03/27/2017, 2:26 PM

## 2017-03-28 ENCOUNTER — Other Ambulatory Visit: Payer: Self-pay | Admitting: Family Medicine

## 2017-03-28 DIAGNOSIS — G4700 Insomnia, unspecified: Secondary | ICD-10-CM

## 2017-04-03 ENCOUNTER — Ambulatory Visit (INDEPENDENT_AMBULATORY_CARE_PROVIDER_SITE_OTHER): Payer: BLUE CROSS/BLUE SHIELD | Admitting: *Deleted

## 2017-04-03 DIAGNOSIS — L02213 Cutaneous abscess of chest wall: Secondary | ICD-10-CM

## 2017-04-03 DIAGNOSIS — M9903 Segmental and somatic dysfunction of lumbar region: Secondary | ICD-10-CM | POA: Diagnosis not present

## 2017-04-03 DIAGNOSIS — M9904 Segmental and somatic dysfunction of sacral region: Secondary | ICD-10-CM | POA: Diagnosis not present

## 2017-04-03 DIAGNOSIS — M5417 Radiculopathy, lumbosacral region: Secondary | ICD-10-CM | POA: Diagnosis not present

## 2017-04-03 DIAGNOSIS — M5418 Radiculopathy, sacral and sacrococcygeal region: Secondary | ICD-10-CM | POA: Diagnosis not present

## 2017-04-03 NOTE — Progress Notes (Signed)
Patient came in today for a wound check.  The wound is clean, with no signs of infection noted. The sutures were removed and steri strips applied.  

## 2017-04-03 NOTE — Patient Instructions (Signed)
Return as needed.The patient is aware to call back for any questions or concerns.  

## 2017-04-04 DIAGNOSIS — M545 Low back pain: Secondary | ICD-10-CM | POA: Diagnosis not present

## 2017-04-04 DIAGNOSIS — M5418 Radiculopathy, sacral and sacrococcygeal region: Secondary | ICD-10-CM | POA: Diagnosis not present

## 2017-04-04 DIAGNOSIS — M9903 Segmental and somatic dysfunction of lumbar region: Secondary | ICD-10-CM | POA: Diagnosis not present

## 2017-04-04 DIAGNOSIS — M5417 Radiculopathy, lumbosacral region: Secondary | ICD-10-CM | POA: Diagnosis not present

## 2017-04-17 DIAGNOSIS — M9903 Segmental and somatic dysfunction of lumbar region: Secondary | ICD-10-CM | POA: Diagnosis not present

## 2017-04-17 DIAGNOSIS — M5418 Radiculopathy, sacral and sacrococcygeal region: Secondary | ICD-10-CM | POA: Diagnosis not present

## 2017-04-17 DIAGNOSIS — M545 Low back pain: Secondary | ICD-10-CM | POA: Diagnosis not present

## 2017-04-17 DIAGNOSIS — M5417 Radiculopathy, lumbosacral region: Secondary | ICD-10-CM | POA: Diagnosis not present

## 2017-04-18 DIAGNOSIS — M5417 Radiculopathy, lumbosacral region: Secondary | ICD-10-CM | POA: Diagnosis not present

## 2017-04-18 DIAGNOSIS — M5418 Radiculopathy, sacral and sacrococcygeal region: Secondary | ICD-10-CM | POA: Diagnosis not present

## 2017-04-18 DIAGNOSIS — M9903 Segmental and somatic dysfunction of lumbar region: Secondary | ICD-10-CM | POA: Diagnosis not present

## 2017-04-18 DIAGNOSIS — M545 Low back pain: Secondary | ICD-10-CM | POA: Diagnosis not present

## 2017-04-19 ENCOUNTER — Other Ambulatory Visit: Payer: Self-pay | Admitting: Family Medicine

## 2017-04-19 DIAGNOSIS — M9903 Segmental and somatic dysfunction of lumbar region: Secondary | ICD-10-CM | POA: Diagnosis not present

## 2017-04-19 DIAGNOSIS — M545 Low back pain: Secondary | ICD-10-CM | POA: Diagnosis not present

## 2017-04-19 DIAGNOSIS — M5418 Radiculopathy, sacral and sacrococcygeal region: Secondary | ICD-10-CM | POA: Diagnosis not present

## 2017-04-19 DIAGNOSIS — M5417 Radiculopathy, lumbosacral region: Secondary | ICD-10-CM | POA: Diagnosis not present

## 2017-04-26 DIAGNOSIS — R3914 Feeling of incomplete bladder emptying: Secondary | ICD-10-CM | POA: Diagnosis not present

## 2017-04-26 DIAGNOSIS — R972 Elevated prostate specific antigen [PSA]: Secondary | ICD-10-CM | POA: Diagnosis not present

## 2017-04-26 DIAGNOSIS — N401 Enlarged prostate with lower urinary tract symptoms: Secondary | ICD-10-CM | POA: Diagnosis not present

## 2017-05-08 DIAGNOSIS — L308 Other specified dermatitis: Secondary | ICD-10-CM | POA: Diagnosis not present

## 2017-05-17 DIAGNOSIS — H6240 Otitis externa in other diseases classified elsewhere, unspecified ear: Secondary | ICD-10-CM | POA: Diagnosis not present

## 2017-05-30 ENCOUNTER — Ambulatory Visit (INDEPENDENT_AMBULATORY_CARE_PROVIDER_SITE_OTHER): Payer: BLUE CROSS/BLUE SHIELD | Admitting: Physician Assistant

## 2017-05-30 ENCOUNTER — Encounter: Payer: Self-pay | Admitting: Physician Assistant

## 2017-05-30 VITALS — BP 108/62 | HR 64 | Temp 97.7°F | Resp 16 | Wt 228.0 lb

## 2017-05-30 DIAGNOSIS — R21 Rash and other nonspecific skin eruption: Secondary | ICD-10-CM

## 2017-05-30 DIAGNOSIS — J069 Acute upper respiratory infection, unspecified: Secondary | ICD-10-CM | POA: Diagnosis not present

## 2017-05-30 LAB — POCT INFLUENZA A/B
Influenza A, POC: NEGATIVE
Influenza B, POC: NEGATIVE

## 2017-05-30 MED ORDER — TRIAMCINOLONE ACETONIDE 0.1 % EX CREA: 1.0000 "application " | TOPICAL_CREAM | Freq: Two times a day (BID) | CUTANEOUS | 0 refills | Status: DC

## 2017-05-30 NOTE — Patient Instructions (Signed)
Upper Respiratory Infection, Adult Most upper respiratory infections (URIs) are caused by a virus. A URI affects the nose, throat, and upper air passages. The most common type of URI is often called "the common cold." Follow these instructions at home:  Take medicines only as told by your doctor.  Gargle warm saltwater or take cough drops to comfort your throat as told by your doctor.  Use a warm mist humidifier or inhale steam from a shower to increase air moisture. This may make it easier to breathe.  Drink enough fluid to keep your pee (urine) clear or pale yellow.  Eat soups and other clear broths.  Have a healthy diet.  Rest as needed.  Go back to work when your fever is gone or your doctor says it is okay. ? You may need to stay home longer to avoid giving your URI to others. ? You can also wear a face mask and wash your hands often to prevent spread of the virus.  Use your inhaler more if you have asthma.  Do not use any tobacco products, including cigarettes, chewing tobacco, or electronic cigarettes. If you need help quitting, ask your doctor. Contact a doctor if:  You are getting worse, not better.  Your symptoms are not helped by medicine.  You have chills.  You are getting more short of breath.  You have brown or red mucus.  You have yellow or brown discharge from your nose.  You have pain in your face, especially when you bend forward.  You have a fever.  You have puffy (swollen) neck glands.  You have pain while swallowing.  You have white areas in the back of your throat. Get help right away if:  You have very bad or constant: ? Headache. ? Ear pain. ? Pain in your forehead, behind your eyes, and over your cheekbones (sinus pain). ? Chest pain.  You have long-lasting (chronic) lung disease and any of the following: ? Wheezing. ? Long-lasting cough. ? Coughing up blood. ? A change in your usual mucus.  You have a stiff neck.  You have  changes in your: ? Vision. ? Hearing. ? Thinking. ? Mood. This information is not intended to replace advice given to you by your health care provider. Make sure you discuss any questions you have with your health care provider. Document Released: 03/07/2008 Document Revised: 05/22/2016 Document Reviewed: 12/25/2013 Elsevier Interactive Patient Education  2018 Elsevier Inc.  

## 2017-05-30 NOTE — Progress Notes (Signed)
Patient: Kurt Maldonado Male    DOB: 1966-05-26   51 y.o.   MRN: [id number] Visit Date: 05/30/2017  Today's Provider: Trey Sailors, PA-C   Chief Complaint  Patient presents with  . Sinusitis   Subjective:      Kurt Maldonado is a 51 y/o man with history of HTN and scheduled for rotator cuff surgery on Friday presenting today with cough, congestion, sinus pressure ongoing since Sunday. Denies fever, but feels chills. Endorses loss of voice. Denies myalgias. Says his brother in law was diagnosed with flu B recently. Also has itchy rash on back of left leg after searching for golf ball in the woods. No pus, bleeding, weeping.   Sinusitis  This is a new problem. The current episode started in the past 7 days. The problem has been gradually worsening since onset. There has been no fever. Associated symptoms include congestion, ear pain, headaches, a hoarse voice, neck pain, sinus pressure and sneezing. Pertinent negatives include no chills, coughing, diaphoresis, shortness of breath, sore throat or swollen glands. Past treatments include oral decongestants.  Rash  This is a new problem. The current episode started in the past 7 days. The problem is unchanged. The affected locations include the left lower leg. The rash is characterized by itchiness and redness. He was exposed to nothing (Pt was walking in the woods trying to find golf ball). Associated symptoms include congestion and fatigue. Pertinent negatives include no cough, fever, rhinorrhea, shortness of breath or sore throat.       Allergies  Allergen Reactions  . Codeine Nausea And Vomiting and Other (See Comments)    Muscle tightness      Current Outpatient Prescriptions:  .  Ascorbic Acid (VITAMIN C) 1000 MG tablet, Take by mouth., Disp: , Rfl:  .  cholecalciferol (VITAMIN D) 1000 units tablet, Take 1,000 Units by mouth daily., Disp: , Rfl:  .  Cyanocobalamin (RA VITAMIN B-12 TR) 1000 MCG TBCR, Take by mouth.,  Disp: , Rfl:  .  fluticasone (FLONASE) 50 MCG/ACT nasal spray, Instill 2 sprays in each nostril once daily., Disp: 16 g, Rfl: 11 .  gabapentin (NEURONTIN) 300 MG capsule, Take 600 mg by mouth daily. , Disp: , Rfl:  .  omeprazole (PRILOSEC) 20 MG capsule, Take 1 capsule by mouth every morning., Disp: 30 capsule, Rfl: 9 .  sertraline (ZOLOFT) 50 MG tablet, Take 1 tablet (50 mg total) by mouth daily., Disp: 90 tablet, Rfl: 3 .  zolpidem (AMBIEN) 10 MG tablet, TAKE 1 TABLET BY MOUTH AT BEDTIME AS NEEDED FOR SLEEP, Disp: 30 tablet, Rfl: 5 .  oxymetazoline (AFRIN) 0.05 % nasal spray, Place 2 sprays into both nostrils 2 (two) times daily as needed for congestion., Disp: , Rfl:   Review of Systems  Constitutional: Positive for fatigue. Negative for activity change, appetite change, chills, diaphoresis, fever and unexpected weight change.  HENT: Positive for congestion, ear pain, hoarse voice, postnasal drip, sinus pain, sinus pressure, sneezing and voice change. Negative for ear discharge, nosebleeds, rhinorrhea, sore throat, tinnitus and trouble swallowing.   Eyes: Negative.   Respiratory: Negative.  Negative for cough and shortness of breath.   Gastrointestinal: Negative.   Musculoskeletal: Positive for neck pain and neck stiffness.  Skin: Positive for rash (Rash behind his left leg).  Neurological: Positive for headaches. Negative for dizziness and light-headedness.    Social History  Substance Use Topics  . Smoking status: Never Smoker  . Smokeless tobacco:  Never Used  . Alcohol use Yes     Comment: rare- 2 beers per month    Objective:   BP 108/62 (BP Location: Left Arm, Patient Position: Sitting, Cuff Size: Large)   Pulse 64   Temp 97.7 F (36.5 C) (Oral)   Resp 16   Wt 228 lb (103.4 kg)   BMI 29.27 kg/m  Vitals:   05/30/17 1106  BP: 108/62  Pulse: 64  Resp: 16  Temp: 97.7 F (36.5 C)  TempSrc: Oral  Weight: 228 lb (103.4 kg)     Physical Exam  Constitutional: He is  oriented to person, place, and time. He appears well-developed and well-nourished. He appears ill.  Hoarse voice.  HENT:  Right Ear: Tympanic membrane and external ear normal.  Left Ear: Tympanic membrane and external ear normal.  Mouth/Throat: Oropharynx is clear and moist. No oropharyngeal exudate.  Erythematous canals without injection or bulging of TMs  Eyes: Conjunctivae are normal.  Neck: Neck supple.  Cardiovascular: Normal rate and regular rhythm.   Pulmonary/Chest: Effort normal and breath sounds normal.  Lymphadenopathy:    He has cervical adenopathy.  Neurological: He is alert and oriented to person, place, and time.  Skin: Skin is warm and dry. Rash noted.     Small cluster of erythematous papules on posterior left leg. Not petechial.   Psychiatric: He has a normal mood and affect. His behavior is normal.        Assessment & Plan:     1. Viral upper respiratory tract infection  Flu test today negative. Think it is viral upper respiratory infection, natural course 7-10 days. If after 7-10 days patient feels he has sinus infection, can call in abx then. Suggested he reschedule surgery when he is feeling better.  - POCT Influenza A/B  2. Rash  - triamcinolone cream (KENALOG) 0.1 %; Apply 1 application topically 2 (two) times daily.  Dispense: 30 g; Refill: 0  Return if symptoms worsen or fail to improve.  The entirety of the information documented in the History of Present Illness, Review of Systems and Physical Exam were personally obtained by me. Portions of this information were initially documented by Kavin Leech, CMA and reviewed by me for thoroughness and accuracy.        Trey Sailors, PA-C  [facility]

## 2017-06-09 DIAGNOSIS — M659 Synovitis and tenosynovitis, unspecified: Secondary | ICD-10-CM | POA: Diagnosis not present

## 2017-06-09 DIAGNOSIS — S43432A Superior glenoid labrum lesion of left shoulder, initial encounter: Secondary | ICD-10-CM | POA: Diagnosis not present

## 2017-06-09 DIAGNOSIS — M25512 Pain in left shoulder: Secondary | ICD-10-CM | POA: Diagnosis not present

## 2017-06-09 DIAGNOSIS — G8918 Other acute postprocedural pain: Secondary | ICD-10-CM | POA: Diagnosis not present

## 2017-06-09 DIAGNOSIS — S46092A Other injury of muscle(s) and tendon(s) of the rotator cuff of left shoulder, initial encounter: Secondary | ICD-10-CM | POA: Diagnosis not present

## 2017-06-09 DIAGNOSIS — M75122 Complete rotator cuff tear or rupture of left shoulder, not specified as traumatic: Secondary | ICD-10-CM | POA: Diagnosis not present

## 2017-06-09 DIAGNOSIS — R600 Localized edema: Secondary | ICD-10-CM | POA: Diagnosis not present

## 2017-06-09 DIAGNOSIS — S46912D Strain of unspecified muscle, fascia and tendon at shoulder and upper arm level, left arm, subsequent encounter: Secondary | ICD-10-CM | POA: Diagnosis not present

## 2017-06-09 DIAGNOSIS — M94212 Chondromalacia, left shoulder: Secondary | ICD-10-CM | POA: Diagnosis not present

## 2017-06-09 DIAGNOSIS — M19012 Primary osteoarthritis, left shoulder: Secondary | ICD-10-CM | POA: Diagnosis not present

## 2017-06-09 DIAGNOSIS — M7522 Bicipital tendinitis, left shoulder: Secondary | ICD-10-CM | POA: Diagnosis not present

## 2017-06-09 DIAGNOSIS — S46012D Strain of muscle(s) and tendon(s) of the rotator cuff of left shoulder, subsequent encounter: Secondary | ICD-10-CM | POA: Diagnosis not present

## 2017-06-09 DIAGNOSIS — M24112 Other articular cartilage disorders, left shoulder: Secondary | ICD-10-CM | POA: Diagnosis not present

## 2017-06-09 DIAGNOSIS — M7542 Impingement syndrome of left shoulder: Secondary | ICD-10-CM | POA: Diagnosis not present

## 2017-06-15 ENCOUNTER — Ambulatory Visit (INDEPENDENT_AMBULATORY_CARE_PROVIDER_SITE_OTHER): Payer: BLUE CROSS/BLUE SHIELD | Admitting: Family Medicine

## 2017-06-15 VITALS — BP 118/72 | HR 84 | Temp 98.3°F | Resp 16 | Wt 236.4 lb

## 2017-06-15 DIAGNOSIS — R142 Eructation: Secondary | ICD-10-CM | POA: Diagnosis not present

## 2017-06-15 DIAGNOSIS — R110 Nausea: Secondary | ICD-10-CM | POA: Diagnosis not present

## 2017-06-15 MED ORDER — RANITIDINE HCL 150 MG PO TABS: 150.0000 mg | ORAL_TABLET | Freq: Two times a day (BID) | ORAL | 5 refills | Status: DC

## 2017-06-15 MED ORDER — PROMETHAZINE HCL 25 MG/ML IJ SOLN: 50.0000 mg | Freq: Once | INTRAMUSCULAR | Status: DC

## 2017-06-15 MED ORDER — PROMETHAZINE HCL 25 MG PO TABS: 25.0000 mg | ORAL_TABLET | Freq: Four times a day (QID) | ORAL | 1 refills | Status: DC | PRN

## 2017-06-15 NOTE — Progress Notes (Signed)
Kurt PrestoRichard T Maldonado  MRN: [id number] DOB: 27-Nov-1965  Subjective:  HPI  Patient states he had rotator cuff surgery on the left done on Friday 06/09/17. Since then he has had nausea, belching and stomach cramping. He has felt feverish the other day but did not have a fever when he checked. No vomiting. He has been taking Zofran which has not seemed to help the nausea. He does take Omeprazole 20 mg daily.  It seems to get worse shortly after eating.  Patient Active Problem List   Diagnosis Date Noted  . Insomnia 04/04/2016  . Parkinson's disease (HCC) 08/20/2014  . Benign fibroma of prostate 07/31/2014  . Clinical depression 07/31/2014  . Acid reflux 07/31/2014  . Calculus of kidney 07/31/2014  . Headache, migraine 07/31/2014  . Neurocardiogenic syncope 07/31/2014  . Gastro-esophageal reflux disease without esophagitis 05/14/2014  . Bleeding per rectum 05/14/2014  . Beat, premature ventricular 04/08/2014  . Atrial fibrillation and flutter (HCC) 04/08/2014  . BP (high blood pressure) 03/25/2014  . Awareness of heartbeats 03/25/2014  . NS (nuclear sclerosis) 02/05/2014  . Atelectasis 08/04/1983    Past Medical History:  Diagnosis Date  . Anxiety   . Arthritis    hand   . Collapsed lung    left lung- 1984   . Dysrhythmia    pvc's per pt on no medication   . GERD (gastroesophageal reflux disease)   . Syncopal episodes    08/2014 - followed [facility]ll [facility] at [facility], abnormal EEG- - note 08/20/14 in Care Everywhere )      Social History   Social History  . Marital status: Married    Spouse name: N/A  . Number of children: N/A  . Years of education: N/A   Occupational History  . Not on file.   Social History Main Topics  . Smoking status: Never Smoker  . Smokeless tobacco: Never Used  . Alcohol use Yes     Comment: rare- 2 beers per month   . Drug use: No  . Sexual activity: Not on file   Other Topics Concern  . Not on file   Social History Narrative  . No  narrative on file    Outpatient Encounter Prescriptions as of 06/15/2017  Medication Sig Note  . Ascorbic Acid (VITAMIN C) 1000 MG tablet Take by mouth. 08/29/2016: [facility] Received Sig: Take 1,000 mg by mouth once daily.  . cholecalciferol (VITAMIN D) 1000 units tablet Take 1,000 Units by mouth daily.   . Cyanocobalamin (RA VITAMIN B-12 TR) 1000 MCG TBCR Take by mouth. 08/29/2016: [facility] Received Sig: Take 1,000 mcg by mouth once daily.  Marland Kitchen. gabapentin (NEURONTIN) 300 MG capsule Take 600 mg by mouth daily.  11/25/2015: Received from: External Pharmacy  . omeprazole (PRILOSEC) 20 MG capsule Take 1 capsule by mouth every morning.   . ondansetron (ZOFRAN) 4 MG tablet    . oxyCODONE-acetaminophen (PERCOCET/ROXICET) 5-325 MG tablet    . sertraline (ZOLOFT) 50 MG tablet Take 1 tablet (50 mg total) by mouth daily.   Marland Kitchen. triamcinolone cream (KENALOG) 0.1 % Apply 1 application topically 2 (two) times daily.   Marland Kitchen. zolpidem (AMBIEN) 10 MG tablet TAKE 1 TABLET BY MOUTH AT BEDTIME AS NEEDED FOR SLEEP   . fluticasone (FLONASE) 50 MCG/ACT nasal spray Instill 2 sprays in each nostril once daily. (Patient not taking: Reported on 06/15/2017)   . oxymetazoline (AFRIN) 0.05 % nasal spray Place 2 sprays into  both nostrils 2 (two) times daily as needed for congestion.    No facility-administered encounter medications on file as of 06/15/2017.     Allergies  Allergen Reactions  . Codeine Nausea And Vomiting and Other (See Comments)    Muscle tightness     Review of Systems  Constitutional: Positive for malaise/fatigue.  Eyes: Negative.   Respiratory: Negative.   Cardiovascular: Negative.   Gastrointestinal: Positive for nausea.       Belching, stomach cramping  Musculoskeletal: Positive for joint pain.  Skin: Negative.   Neurological: Positive for weakness.  Endo/Heme/Allergies: Negative.   Psychiatric/Behavioral: Negative.       Objective:  BP 118/72   Pulse 84   Temp 98.3 F (36.8 C)   Resp 16   Wt 236 lb 6.4 oz (107.2 kg)   BMI 30.35 kg/m   Physical Exam  Constitutional: He is oriented to person, place, and time and well-developed, well-nourished, and in no distress.  HENT:  Head: Normocephalic and atraumatic.  Eyes: Conjunctivae are normal. No scleral icterus.  Neck: No thyromegaly present.  Cardiovascular: Normal rate, regular rhythm and normal heart sounds.   Pulmonary/Chest: Effort normal and breath sounds normal.  Abdominal: Soft.  Minimal tenderness.  Neurological: He is alert and oriented to person, place, and time. Gait normal. GCS score is 15.  Skin: Skin is warm and dry.  Psychiatric: Mood, memory, affect and judgment normal.    Assessment and Plan :  Abdominal Pain Obtain labs and [place].  Rx with Phenergan and Zantac BID.Stop Naproxen. S/p Recent Left Shoulder Surgery S/p Appendectomyy  I have done the exam and reviewed the chart and it is accurate to the best of my knowledge. Dentist has been used and  any errors in dictation or transcription are unintentional. Julieanne Manson M.D. [facility]

## 2017-06-16 LAB — COMPREHENSIVE METABOLIC PANEL
A/G RATIO: 1.5 (ref 1.2–2.2)
ALBUMIN: 4.3 g/dL (ref 3.5–5.5)
ALT: 260 IU/L — ABNORMAL HIGH (ref 0–44)
AST: 213 IU/L — ABNORMAL HIGH (ref 0–40)
Alkaline Phosphatase: 139 IU/L — ABNORMAL HIGH (ref 39–117)
BILIRUBIN TOTAL: 0.5 mg/dL (ref 0.0–1.2)
BUN/Creatinine Ratio: 18 (ref 9–20)
BUN: 18 mg/dL (ref 6–24)
CO2: 25 mmol/L (ref 20–29)
Calcium: 9.6 mg/dL (ref 8.7–10.2)
Chloride: 100 mmol/L (ref 96–106)
Creatinine, Ser: 1 mg/dL (ref 0.76–1.27)
GFR, EST AFRICAN AMERICAN: 100 mL/min/{1.73_m2} (ref >59)
GFR, EST NON AFRICAN AMERICAN: 86 mL/min/{1.73_m2} (ref >59)
GLOBULIN, TOTAL: 2.8 g/dL (ref 1.5–4.5)
Glucose: 95 mg/dL (ref 65–99)
POTASSIUM: 5 mmol/L (ref 3.5–5.2)
Sodium: 139 mmol/L (ref 134–144)
TOTAL PROTEIN: 7.1 g/dL (ref 6.0–8.5)

## 2017-06-16 LAB — CBC WITH DIFFERENTIAL/PLATELET
BASOS: 0 %
Basophils Absolute: 0 10*3/uL (ref 0.0–0.2)
EOS (ABSOLUTE): 0.7 10*3/uL — AB (ref 0.0–0.4)
Eos: 8 %
HEMOGLOBIN: 14 g/dL (ref 13.0–17.7)
Hematocrit: 41.7 % (ref 37.5–51.0)
IMMATURE GRANS (ABS): 0 10*3/uL (ref 0.0–0.1)
Immature Granulocytes: 0 %
LYMPHS: 22 %
Lymphocytes Absolute: 2.1 10*3/uL (ref 0.7–3.1)
MCH: 31.4 pg (ref 26.6–33.0)
MCHC: 33.6 g/dL (ref 31.5–35.7)
MCV: 94 fL (ref 79–97)
Monocytes Absolute: 0.7 10*3/uL (ref 0.1–0.9)
Monocytes: 8 %
NEUTROS ABS: 6 10*3/uL (ref 1.4–7.0)
Neutrophils: 62 %
PLATELETS: 309 10*3/uL (ref 150–379)
RBC: 4.46 x10E6/uL (ref 4.14–5.80)
RDW: 13.6 % (ref 12.3–15.4)
WBC: 9.6 10*3/uL (ref 3.4–10.8)

## 2017-06-16 LAB — SEDIMENTATION RATE: SED RATE: 5 mm/h (ref 0–30)

## 2017-06-16 LAB — HELICOBACTER PYLORI  ANTIBODY, IGM

## 2017-06-19 ENCOUNTER — Other Ambulatory Visit: Payer: Self-pay | Admitting: Emergency Medicine

## 2017-06-19 DIAGNOSIS — R110 Nausea: Secondary | ICD-10-CM

## 2017-06-19 DIAGNOSIS — R7989 Other specified abnormal findings of blood chemistry: Secondary | ICD-10-CM

## 2017-06-19 DIAGNOSIS — R5383 Other fatigue: Secondary | ICD-10-CM | POA: Diagnosis not present

## 2017-06-19 DIAGNOSIS — R945 Abnormal results of liver function studies: Secondary | ICD-10-CM

## 2017-06-19 MED ORDER — PROMETHAZINE HCL 50 MG/ML IJ SOLN
50.0000 mg | Freq: Four times a day (QID) | INTRAMUSCULAR | Status: DC | PRN
  Administered 2017-06-15: 50 mg via INTRAMUSCULAR

## 2017-06-19 NOTE — Progress Notes (Signed)
Per Dr. Gilbert verbal order 

## 2017-06-19 NOTE — Addendum Note (Signed)
Addended by: Consuella Lose on: 06/19/2017 08:15 AM   Modules accepted: Orders

## 2017-06-20 ENCOUNTER — Ambulatory Visit
Admission: RE | Admit: 2017-06-20 | Discharge: 2017-06-20 | Disposition: A | Discharge status: Home / Self Care | Payer: BLUE CROSS/BLUE SHIELD | Source: Ambulatory Visit | Attending: Family Medicine | Admitting: Family Medicine

## 2017-06-20 ENCOUNTER — Other Ambulatory Visit: Payer: Self-pay | Admitting: Emergency Medicine

## 2017-06-20 DIAGNOSIS — R419 Unspecified symptoms and signs involving cognitive functions and awareness: Secondary | ICD-10-CM | POA: Diagnosis not present

## 2017-06-20 DIAGNOSIS — S46912D Strain of unspecified muscle, fascia and tendon at shoulder and upper arm level, left arm, subsequent encounter: Secondary | ICD-10-CM | POA: Diagnosis not present

## 2017-06-20 DIAGNOSIS — R932 Abnormal findings on diagnostic imaging of liver and biliary tract: Secondary | ICD-10-CM | POA: Diagnosis not present

## 2017-06-20 DIAGNOSIS — R142 Eructation: Secondary | ICD-10-CM | POA: Diagnosis not present

## 2017-06-20 DIAGNOSIS — R110 Nausea: Secondary | ICD-10-CM | POA: Insufficient documentation

## 2017-06-20 DIAGNOSIS — G43119 Migraine with aura, intractable, without status migrainosus: Secondary | ICD-10-CM | POA: Diagnosis not present

## 2017-06-20 DIAGNOSIS — K7689 Other specified diseases of liver: Secondary | ICD-10-CM | POA: Diagnosis not present

## 2017-06-20 DIAGNOSIS — G2581 Restless legs syndrome: Secondary | ICD-10-CM | POA: Diagnosis not present

## 2017-06-20 DIAGNOSIS — R600 Localized edema: Secondary | ICD-10-CM | POA: Diagnosis not present

## 2017-06-20 DIAGNOSIS — G20 Parkinson's disease: Secondary | ICD-10-CM | POA: Diagnosis not present

## 2017-06-20 DIAGNOSIS — S46012D Strain of muscle(s) and tendon(s) of the rotator cuff of left shoulder, subsequent encounter: Secondary | ICD-10-CM | POA: Diagnosis not present

## 2017-06-20 DIAGNOSIS — M25512 Pain in left shoulder: Secondary | ICD-10-CM | POA: Diagnosis not present

## 2017-06-20 LAB — HEPATIC FUNCTION PANEL
AG Ratio: 1.6 (calc) (ref 1.0–2.5)
ALT: 81 U/L — AB (ref 9–46)
AST: 25 U/L (ref 10–35)
Albumin: 4.1 g/dL (ref 3.6–5.1)
Alkaline phosphatase (APISO): 120 U/L — ABNORMAL HIGH (ref 40–115)
BILIRUBIN TOTAL: 0.5 mg/dL (ref 0.2–1.2)
Bilirubin, Direct: 0.1 mg/dL (ref 0.0–0.2)
Globulin: 2.6 g/dL (calc) (ref 1.9–3.7)
Indirect Bilirubin: 0.4 mg/dL (calc) (ref 0.2–1.2)
Total Protein: 6.7 g/dL (ref 6.1–8.1)

## 2017-06-20 LAB — CMV IGM

## 2017-06-20 LAB — HEPATITIS A ANTIBODY, IGM: HEP A IGM: NONREACTIVE

## 2017-06-20 LAB — HEPATITIS B CORE ANTIBODY, IGM: Hep B C IgM: NONREACTIVE

## 2017-06-20 LAB — EPSTEIN-BARR VIRUS NUCLEAR ANTIGEN ANTIBODY, IGG: EBV NA IGG: 270 U/mL — AB

## 2017-06-20 LAB — HEPATITIS C ANTIBODY
HEP C AB: NONREACTIVE
SIGNAL TO CUT-OFF: 0.01 (ref <1.00)

## 2017-06-21 ENCOUNTER — Ambulatory Visit: Payer: 59

## 2017-06-21 ENCOUNTER — Encounter: Payer: Self-pay | Admitting: Gastroenterology

## 2017-06-22 DIAGNOSIS — K219 Gastro-esophageal reflux disease without esophagitis: Secondary | ICD-10-CM | POA: Diagnosis not present

## 2017-06-22 DIAGNOSIS — R112 Nausea with vomiting, unspecified: Secondary | ICD-10-CM | POA: Diagnosis not present

## 2017-06-22 DIAGNOSIS — R142 Eructation: Secondary | ICD-10-CM | POA: Diagnosis not present

## 2017-06-22 DIAGNOSIS — Z86010 Personal history of colonic polyps: Secondary | ICD-10-CM | POA: Diagnosis not present

## 2017-06-27 ENCOUNTER — Other Ambulatory Visit: Payer: Self-pay | Admitting: Emergency Medicine

## 2017-06-27 DIAGNOSIS — R5383 Other fatigue: Secondary | ICD-10-CM

## 2017-06-27 NOTE — Addendum Note (Signed)
Addended by: Latanya Presser on: 06/27/2017 10:50 AM   Modules accepted: Orders

## 2017-06-28 ENCOUNTER — Ambulatory Visit: Payer: BLUE CROSS/BLUE SHIELD | Admitting: Anesthesiology

## 2017-06-28 ENCOUNTER — Encounter: Payer: Self-pay | Admitting: *Deleted

## 2017-06-28 ENCOUNTER — Ambulatory Visit
Admission: RE | Admit: 2017-06-28 | Discharge: 2017-06-28 | Disposition: A | Discharge status: Home / Self Care | Payer: BLUE CROSS/BLUE SHIELD | Source: Ambulatory Visit | Attending: Internal Medicine | Admitting: Internal Medicine

## 2017-06-28 ENCOUNTER — Encounter
Admission: RE | Disposition: A | Discharge status: Home / Self Care | Payer: Self-pay | Source: Ambulatory Visit | Attending: Internal Medicine

## 2017-06-28 DIAGNOSIS — I10 Essential (primary) hypertension: Secondary | ICD-10-CM | POA: Diagnosis not present

## 2017-06-28 DIAGNOSIS — Z833 Family history of diabetes mellitus: Secondary | ICD-10-CM | POA: Diagnosis not present

## 2017-06-28 DIAGNOSIS — K2970 Gastritis, unspecified, without bleeding: Secondary | ICD-10-CM | POA: Diagnosis not present

## 2017-06-28 DIAGNOSIS — Z8249 Family history of ischemic heart disease and other diseases of the circulatory system: Secondary | ICD-10-CM | POA: Diagnosis not present

## 2017-06-28 DIAGNOSIS — Z87442 Personal history of urinary calculi: Secondary | ICD-10-CM | POA: Diagnosis not present

## 2017-06-28 DIAGNOSIS — R112 Nausea with vomiting, unspecified: Secondary | ICD-10-CM | POA: Insufficient documentation

## 2017-06-28 DIAGNOSIS — F419 Anxiety disorder, unspecified: Secondary | ICD-10-CM | POA: Insufficient documentation

## 2017-06-28 DIAGNOSIS — K2950 Unspecified chronic gastritis without bleeding: Secondary | ICD-10-CM | POA: Diagnosis not present

## 2017-06-28 DIAGNOSIS — G20 Parkinson's disease: Secondary | ICD-10-CM | POA: Insufficient documentation

## 2017-06-28 DIAGNOSIS — Z8042 Family history of malignant neoplasm of prostate: Secondary | ICD-10-CM | POA: Insufficient documentation

## 2017-06-28 DIAGNOSIS — I493 Ventricular premature depolarization: Secondary | ICD-10-CM | POA: Insufficient documentation

## 2017-06-28 DIAGNOSIS — Z8371 Family history of colonic polyps: Secondary | ICD-10-CM | POA: Diagnosis not present

## 2017-06-28 DIAGNOSIS — F329 Major depressive disorder, single episode, unspecified: Secondary | ICD-10-CM | POA: Insufficient documentation

## 2017-06-28 DIAGNOSIS — K2930 Chronic superficial gastritis without bleeding: Secondary | ICD-10-CM | POA: Diagnosis not present

## 2017-06-28 DIAGNOSIS — Z8349 Family history of other endocrine, nutritional and metabolic diseases: Secondary | ICD-10-CM | POA: Diagnosis not present

## 2017-06-28 DIAGNOSIS — Z820 Family history of epilepsy and other diseases of the nervous system: Secondary | ICD-10-CM | POA: Diagnosis not present

## 2017-06-28 DIAGNOSIS — Z8262 Family history of osteoporosis: Secondary | ICD-10-CM | POA: Insufficient documentation

## 2017-06-28 DIAGNOSIS — Z8489 Family history of other specified conditions: Secondary | ICD-10-CM | POA: Insufficient documentation

## 2017-06-28 DIAGNOSIS — E079 Disorder of thyroid, unspecified: Secondary | ICD-10-CM | POA: Diagnosis not present

## 2017-06-28 DIAGNOSIS — G43909 Migraine, unspecified, not intractable, without status migrainosus: Secondary | ICD-10-CM | POA: Insufficient documentation

## 2017-06-28 DIAGNOSIS — N400 Enlarged prostate without lower urinary tract symptoms: Secondary | ICD-10-CM | POA: Diagnosis not present

## 2017-06-28 DIAGNOSIS — K2960 Other gastritis without bleeding: Secondary | ICD-10-CM | POA: Diagnosis not present

## 2017-06-28 DIAGNOSIS — R1013 Epigastric pain: Secondary | ICD-10-CM | POA: Diagnosis present

## 2017-06-28 DIAGNOSIS — Z8261 Family history of arthritis: Secondary | ICD-10-CM | POA: Diagnosis not present

## 2017-06-28 DIAGNOSIS — G2581 Restless legs syndrome: Secondary | ICD-10-CM | POA: Insufficient documentation

## 2017-06-28 DIAGNOSIS — K219 Gastro-esophageal reflux disease without esophagitis: Secondary | ICD-10-CM | POA: Diagnosis not present

## 2017-06-28 DIAGNOSIS — K208 Other esophagitis: Secondary | ICD-10-CM | POA: Diagnosis not present

## 2017-06-28 HISTORY — PX: ESOPHAGOGASTRODUODENOSCOPY: SHX5428

## 2017-06-28 HISTORY — DX: Tremor, unspecified: R25.1

## 2017-06-28 HISTORY — DX: Disorder of thyroid, unspecified: E07.9

## 2017-06-28 HISTORY — DX: Restless legs syndrome: G25.81

## 2017-06-28 HISTORY — DX: Depression, unspecified: F32.A

## 2017-06-28 HISTORY — DX: REM sleep behavior disorder: G47.52

## 2017-06-28 HISTORY — DX: Personal history of urinary calculi: Z87.442

## 2017-06-28 HISTORY — DX: Major depressive disorder, single episode, unspecified: F32.9

## 2017-06-28 HISTORY — DX: Carpal tunnel syndrome, unspecified upper limb: G56.00

## 2017-06-28 HISTORY — DX: Parkinson's disease: G20

## 2017-06-28 HISTORY — DX: Syncope and collapse: R55

## 2017-06-28 HISTORY — DX: Benign prostatic hyperplasia without lower urinary tract symptoms: N40.0

## 2017-06-28 HISTORY — DX: Parkinson's disease without dyskinesia, without mention of fluctuations: G20.A1

## 2017-06-28 HISTORY — DX: Migraine, unspecified, not intractable, without status migrainosus: G43.909

## 2017-06-28 SURGERY — EGD (ESOPHAGOGASTRODUODENOSCOPY)
Anesthesia: General

## 2017-06-28 MED ORDER — LIDOCAINE HCL (CARDIAC) 20 MG/ML IV SOLN
INTRAVENOUS | Status: DC | PRN
  Administered 2017-06-28: 30 mg via INTRAVENOUS

## 2017-06-28 MED ORDER — PROPOFOL 500 MG/50ML IV EMUL
INTRAVENOUS | Status: AC
  Filled 2017-06-28: qty 50

## 2017-06-28 MED ORDER — GLYCOPYRROLATE 0.2 MG/ML IJ SOLN
INTRAMUSCULAR | Status: DC | PRN
  Administered 2017-06-28: 0.1 mg via INTRAVENOUS

## 2017-06-28 MED ORDER — PROPOFOL 10 MG/ML IV BOLUS
INTRAVENOUS | Status: AC
  Filled 2017-06-28: qty 20

## 2017-06-28 MED ORDER — SODIUM CHLORIDE 0.9 % IV SOLN
INTRAVENOUS | Status: DC
  Administered 2017-06-28: 1000 mL via INTRAVENOUS

## 2017-06-28 MED ORDER — PROPOFOL 500 MG/50ML IV EMUL
INTRAVENOUS | Status: DC | PRN
  Administered 2017-06-28: 120 ug/kg/min via INTRAVENOUS

## 2017-06-28 MED ORDER — BUTAMBEN-TETRACAINE-BENZOCAINE 2-2-14 % EX AERO
INHALATION_SPRAY | CUTANEOUS | Status: DC | PRN
  Administered 2017-06-28: 2 via TOPICAL

## 2017-06-28 MED ORDER — MIDAZOLAM HCL 2 MG/2ML IJ SOLN
INTRAMUSCULAR | Status: AC
  Filled 2017-06-28: qty 2

## 2017-06-28 MED ORDER — FENTANYL CITRATE (PF) 100 MCG/2ML IJ SOLN
INTRAMUSCULAR | Status: DC | PRN
  Administered 2017-06-28 (×2): 50 ug via INTRAVENOUS

## 2017-06-28 MED ORDER — FENTANYL CITRATE (PF) 100 MCG/2ML IJ SOLN
INTRAMUSCULAR | Status: AC
  Filled 2017-06-28: qty 2

## 2017-06-28 MED ORDER — MIDAZOLAM HCL 2 MG/2ML IJ SOLN
INTRAMUSCULAR | Status: DC | PRN
  Administered 2017-06-28: 2 mg via INTRAVENOUS

## 2017-06-28 NOTE — Anesthesia Procedure Notes (Signed)
Performed by: COOK-MARTIN, Kache Mcclurg Pre-anesthesia Checklist: Patient identified, Emergency Drugs available, Suction available, Patient being monitored and Timeout performed Patient Re-evaluated:Patient Re-evaluated prior to induction Oxygen Delivery Method: Nasal cannula Preoxygenation: Pre-oxygenation with 100% oxygen Induction Type: IV induction Airway Equipment and Method: Bite block Placement Confirmation: CO2 detector and positive ETCO2       

## 2017-06-28 NOTE — Anesthesia Preprocedure Evaluation (Signed)
Anesthesia Evaluation  Patient identified by MRN, date of birth, ID band Patient awake    Reviewed: Allergy & Precautions, NPO status , Patient's Chart, lab work & pertinent test results  History of Anesthesia Complications Negative for: history of anesthetic complications  Airway Mallampati: II       Dental   Pulmonary neg sleep apnea, neg COPD,           Cardiovascular hypertension (pt denies), + dysrhythmias (PVCs) (-) Valvular Problems/Murmurs     Neuro/Psych neg Seizures Anxiety Depression    GI/Hepatic Neg liver ROS, GERD  Medicated,  Endo/Other  neg diabetes  Renal/GU Renal disease (stones)     Musculoskeletal   Abdominal   Peds  Hematology   Anesthesia Other Findings   Reproductive/Obstetrics                             Anesthesia Physical Anesthesia Plan  ASA: II  Anesthesia Plan: General   Post-op Pain Management:    Induction: Intravenous  PONV Risk Score and Plan: Propofol infusion  Airway Management Planned: Nasal Cannula  Additional Equipment:   Intra-op Plan:   Post-operative Plan:   Informed Consent: I have reviewed the patients History and Physical, chart, labs and discussed the procedure including the risks, benefits and alternatives for the proposed anesthesia with the patient or authorized representative who has indicated his/her understanding and acceptance.     Plan Discussed with:   Anesthesia Plan Comments:         Anesthesia Quick Evaluation

## 2017-06-28 NOTE — Anesthesia Postprocedure Evaluation (Signed)
Anesthesia Post Note  Patient: Kurt Maldonado  Procedure(s) Performed: Procedure(s) (LRB): ESOPHAGOGASTRODUODENOSCOPY (EGD) (N/A)  Patient location during evaluation: Endoscopy Anesthesia Type: General Level of consciousness: awake and alert Pain management: pain level controlled Vital Signs Assessment: post-procedure vital signs reviewed and stable Respiratory status: spontaneous breathing and respiratory function stable Cardiovascular status: stable Anesthetic complications: no     Last Vitals:  Vitals:   06/28/17 1425 06/28/17 1426  BP: 99/68   Pulse: 72   Resp: (!) 7   Temp: (!) 36.2 C   SpO2: (!) 89% (!) 89%    Last Pain:  Vitals:   06/28/17 1425  TempSrc: Tympanic                 KEPHART,WILLIAM K

## 2017-06-28 NOTE — Progress Notes (Signed)
CH was visiting the department and spoke with patients wife for a bit. Kurt Maldonado was telling CH about the patient and their life together. Patient status was he was having endoscopy performed due to severe nausea according to his wife.    06/28/17 1430  Clinical Encounter Type  Visited With Family;Patient not available  Visit Type Initial;Patient in surgery

## 2017-06-28 NOTE — Interval H&P Note (Signed)
History and Physical Interval Note:  06/28/2017 2:40 PM  Kurt Maldonado  has presented today for surgery, with the diagnosis of NAUSEA,VOMITING,ERUCTATION  The various methods of treatment have been discussed with the patient and family. After consideration of risks, benefits and other options for treatment, the patient has consented to  Procedure(s): ESOPHAGOGASTRODUODENOSCOPY (EGD) (N/A) as a surgical intervention .  The patient's history has been reviewed, patient examined, no change in status, stable for surgery.  I have reviewed the patient's chart and labs.  Questions were answered to the patient's satisfaction.     [place], [place]

## 2017-06-28 NOTE — Anesthesia Post-op Follow-up Note (Signed)
Anesthesia QCDR form completed.        

## 2017-06-28 NOTE — Transfer of Care (Signed)
Immediate Anesthesia Transfer of Care Note  Patient: Kurt Maldonado  Procedure(s) Performed: Procedure(s): ESOPHAGOGASTRODUODENOSCOPY (EGD) (N/A)  Patient Location: PACU  Anesthesia Type:General  Level of Consciousness: awake and sedated  Airway & Oxygen Therapy: Patient Spontanous Breathing and Patient connected to nasal cannula oxygen  Post-op Assessment: Report given to RN and Post -op Vital signs reviewed and stable  Post vital signs: Reviewed and stable  Last Vitals:  Vitals:   06/28/17 1253  BP: 130/89  Pulse: 82  Resp: 18  Temp: 36.5 C    Last Pain:  Vitals:   06/28/17 1253  TempSrc: Tympanic      Patients Stated Pain Goal: 0 (06/28/17 1253)  Complications: No apparent anesthesia complications

## 2017-06-28 NOTE — H&P (Signed)
Outpatient short stay form Pre-procedure 06/28/2017 1:59 PM Kurt Maldonado, M.D.  Primary Physician: Kurt. Julieanne Maldonado   Reason for visit:  Nausea, vomiting, epigastric pain  History of present illness:  51 y/o wm with chronic heartburn, eructation, nausea. Has hx of incr. Eosinophils in biopsy specimen - 2015 - Kurt. Shelle Maldonado. Denies hematochezia or melena.     Current Facility-Administered Medications:  .  0.9 %  sodium chloride infusion, , Intravenous, Continuous, Kurt Maldonado, Kurt Nearing, MD, Last Rate: 20 mL/hr at 06/28/17 1320, 1,000 mL at 06/28/17 1320 .  butamben-tetracaine-benzocaine (CETACAINE) spray, , , PRN, Kurt Kidney, MD, 2 spray at 06/28/17 1340  Facility-Administered Medications Prior to Admission  Medication Dose Route Frequency Provider Last Rate Last Dose  . promethazine (PHENERGAN) injection 50 mg  50 mg Intramuscular Q6H PRN Kurt Maldonado., MD   50 mg at 06/15/17 0813   Prescriptions Prior to Admission  Medication Sig Dispense Refill Last Dose  . fluticasone (FLONASE) 50 MCG/ACT nasal spray Instill 2 sprays in each nostril once daily. 16 g 11 06/27/2017 at Unknown time  . gabapentin (NEURONTIN) 300 MG capsule Take 600 mg by mouth daily. Take 300 mg in the afternoon and 600 mg at night   06/27/2017 at Unknown time  . omeprazole (PRILOSEC) 20 MG capsule Take 1 capsule by mouth every morning. 30 capsule 9 06/28/2017 at Unknown time  . ondansetron (ZOFRAN) 4 MG tablet    06/27/2017 at Unknown time  . oxyCODONE-acetaminophen (PERCOCET/ROXICET) 5-325 MG tablet    06/27/2017 at Unknown time  . oxymetazoline (AFRIN) 0.05 % nasal spray Place 2 sprays into both nostrils 2 (two) times daily as needed for congestion.   06/27/2017 at Unknown time  . promethazine (PHENERGAN) 25 MG tablet Take 1 tablet (25 mg total) by mouth every 6 (six) hours as needed for nausea or vomiting. 60 tablet 1 Past Week at Unknown time  . ranitidine (ZANTAC) 150 MG tablet Take 1 tablet (150 mg total)  by mouth 2 (two) times daily. 60 tablet 5 06/27/2017 at Unknown time  . sertraline (ZOLOFT) 50 MG tablet Take 1 tablet (50 mg total) by mouth daily. 90 tablet 3 06/27/2017 at Unknown time  . triamcinolone cream (KENALOG) 0.1 % Apply 1 application topically 2 (two) times daily. 30 g 0 06/27/2017 at Unknown time  . zolpidem (AMBIEN) 10 MG tablet TAKE 1 TABLET BY MOUTH AT BEDTIME AS NEEDED FOR SLEEP 30 tablet 5 06/27/2017 at Unknown time  . Ascorbic Acid (VITAMIN C) 1000 MG tablet Take by mouth.   Taking  . cholecalciferol (VITAMIN D) 1000 units tablet Take 5,000 Units by mouth daily. Take 5,000 units daily   Taking  . Cyanocobalamin (RA VITAMIN B-12 TR) 1000 MCG TBCR Take by mouth.   Taking     Allergies  Allergen Reactions  . Codeine Nausea And Vomiting and Other (See Comments)    Muscle tightness      Past Medical History:  Diagnosis Date  . Anxiety   . Arthritis    hand   . BPH (benign prostatic hyperplasia)   . Collapsed lung    left lung- 1984   . CTS (carpal tunnel syndrome)   . Depression   . Dysrhythmia    pvc's per pt on no medication   . GERD (gastroesophageal reflux disease)   . History of Maldonado stones   . Migraines   . Parkinson's disease (HCC)   . REM behavioral disorder   . Restless legs syndrome (  RLS)   . Syncopal episodes    08/2014 - followed by Kurt Maldonado at [place], abnormal EEG- - note 08/20/14 in Care Everywhere )    . Thyroid disease   . Tremor   . Vasovagal episode     Review of systems:   Otherwise negative.   Physical Exam  Gen: NAD. Appears mildly uncomfortable.Has left arm in sling from recent left rotator cuff surgery.  HEENT: [place]/AT. PERRLA. Normal external ear exam.  Chest: CTA, no wheezes.  CV: RR nl S1, S2. No gallops.  Abd: soft, nt, nd. BS+  Ext: no edema. Pulses 2+  Neuro: Alert and oriented. Judgement appears normal. Nonfocal.    Planned procedures: EGD. The patient understands the nature of the planned procedure, indications,  risks, alternatives and potential complications including but not limited to bleeding, infection, perforation, damage to internal organs and possible oversedation/side effects from anesthesia. The patient agrees and gives consent to proceed.  Please refer to procedure notes for findings, recommendations and patient disposition/instructions.    Kurt Maldonado, M.D. Gastroenterology 06/28/2017  1:59 PM

## 2017-06-28 NOTE — Op Note (Signed)
[facility] Patient Name: Kurt Maldonado Procedure Date: 06/28/2017 2:02 PM MRN: [id number] Account #: [id number] Date of Birth: 11-18-1965 Admit Type: Outpatient Age: 51 Room: [facility] ENDO ROOM 1 Gender: Male Note Status: Finalized Procedure:            Upper GI endoscopy Indications:          Epigastric abdominal pain, Esophageal reflux, Nausea                        with vomiting Providers:            Boykin Nearing. Norma Fredrickson MD, MD Referring MD:         Ferdinand Lango. Sullivan Lone, MD (Referring MD) Medicines:            Propofol per Anesthesia Complications:        No immediate complications. Procedure:            Pre-Anesthesia Assessment:                       - ASA Grade Assessment: III - A patient with severe                        systemic disease.                       - After reviewing the risks and benefits, the patient                        was deemed in satisfactory condition to undergo the                        procedure.                       After obtaining informed consent, the endoscope was                        passed under direct vision. Throughout the procedure,                        the patient's blood pressure, pulse, and oxygen                        saturations were monitored continuously. The Endoscope                        was introduced through the mouth, and advanced to the                        third part of duodenum. The upper GI endoscopy was                        accomplished without difficulty. The patient tolerated                        the procedure well. Findings:      The examined esophagus was normal. Biopsies were obtained from the       proximal and distal esophagus with cold forceps for histology of       suspected eosinophilic esophagitis.      Localized mild inflammation characterized by erythema was  found in the       stomach. Biopsies were taken with a cold forceps for Helicobacter pylori       testing.    The examined duodenum was normal.      The exam was otherwise without abnormality. Impression:           - Normal esophagus. Biopsied.                       - Gastritis. Biopsied.                       - Normal examined duodenum.                       - The examination was otherwise normal. Recommendation:       - Await pathology results.                       - Return to my office in 6 weeks.                       - Patient has a contact number available for                        emergencies. The signs and symptoms of potential                        delayed complications were discussed with the patient.                        Return to normal activities tomorrow. Written discharge                        instructions were provided to the patient.                       - Resume previous diet.                       - Continue present medications. Procedure Code(s):    --- Professional ---                       [phone], Esophagogastroduodenoscopy, flexible, transoral;                        with biopsy, single or multiple Diagnosis Code(s):    --- Professional ---                       K29.70, Gastritis, unspecified, without bleeding                       R10.13, Epigastric pain                       K21.9, Gastro-esophageal reflux disease without                        esophagitis                       R11.2, Nausea with vomiting, unspecified CPT copyright 2016 American Medical Association. All rights reserved. The codes documented in this report are preliminary and  upon coder review may  be revised to meet current compliance requirements. [name] MD, MD 06/28/2017 2:26:23 PM This report has been signed electronically. Number of Addenda: 0 Note Initiated On: 06/28/2017 2:02 PM      [facility]

## 2017-06-29 ENCOUNTER — Encounter: Payer: Self-pay | Admitting: Internal Medicine

## 2017-06-29 ENCOUNTER — Other Ambulatory Visit
Admission: RE | Admit: 2017-06-29 | Discharge: 2017-06-29 | Disposition: A | Discharge status: Home / Self Care | Payer: BLUE CROSS/BLUE SHIELD | Source: Ambulatory Visit | Attending: Internal Medicine | Admitting: Internal Medicine

## 2017-06-29 DIAGNOSIS — R197 Diarrhea, unspecified: Secondary | ICD-10-CM | POA: Diagnosis not present

## 2017-06-29 LAB — GASTROINTESTINAL PANEL BY PCR, STOOL (REPLACES STOOL CULTURE)
ADENOVIRUS F40/41: NOT DETECTED
Astrovirus: NOT DETECTED
CRYPTOSPORIDIUM: NOT DETECTED
CYCLOSPORA CAYETANENSIS: NOT DETECTED
Campylobacter species: NOT DETECTED
ENTEROAGGREGATIVE E COLI (EAEC): NOT DETECTED
Entamoeba histolytica: NOT DETECTED
Enteropathogenic E coli (EPEC): NOT DETECTED
Enterotoxigenic E coli (ETEC): NOT DETECTED
GIARDIA LAMBLIA: NOT DETECTED
Norovirus GI/GII: NOT DETECTED
Plesimonas shigelloides: NOT DETECTED
ROTAVIRUS A: NOT DETECTED
Salmonella species: NOT DETECTED
Sapovirus (I, II, IV, and V): NOT DETECTED
Shiga like toxin producing E coli (STEC): NOT DETECTED
Shigella/Enteroinvasive E coli (EIEC): NOT DETECTED
VIBRIO SPECIES: NOT DETECTED
Vibrio cholerae: NOT DETECTED
YERSINIA ENTEROCOLITICA: NOT DETECTED

## 2017-06-29 LAB — C DIFFICILE QUICK SCREEN W PCR REFLEX
C DIFFICILE (CDIFF) INTERP: NOT DETECTED
C DIFFICLE (CDIFF) ANTIGEN: NEGATIVE
C Diff toxin: NEGATIVE

## 2017-06-30 DIAGNOSIS — R5383 Other fatigue: Secondary | ICD-10-CM | POA: Diagnosis not present

## 2017-06-30 LAB — SURGICAL PATHOLOGY

## 2017-07-03 ENCOUNTER — Ambulatory Visit (INDEPENDENT_AMBULATORY_CARE_PROVIDER_SITE_OTHER): Payer: BLUE CROSS/BLUE SHIELD | Admitting: Family Medicine

## 2017-07-03 VITALS — BP 104/78 | HR 100 | Temp 98.2°F | Resp 16 | Wt 229.4 lb

## 2017-07-03 DIAGNOSIS — R894 Abnormal immunological findings in specimens from other organs, systems and tissues: Secondary | ICD-10-CM

## 2017-07-03 DIAGNOSIS — B2700 Gammaherpesviral mononucleosis without complication: Secondary | ICD-10-CM

## 2017-07-03 DIAGNOSIS — R5383 Other fatigue: Secondary | ICD-10-CM

## 2017-07-03 DIAGNOSIS — R142 Eructation: Secondary | ICD-10-CM | POA: Diagnosis not present

## 2017-07-03 DIAGNOSIS — R110 Nausea: Secondary | ICD-10-CM

## 2017-07-03 DIAGNOSIS — R748 Abnormal levels of other serum enzymes: Secondary | ICD-10-CM | POA: Diagnosis not present

## 2017-07-03 MED ORDER — PREDNISONE 10 MG (48) PO TBPK: ORAL_TABLET | ORAL | 0 refills | Status: DC

## 2017-07-03 NOTE — Progress Notes (Addendum)
Kurt Maldonado  MRN: [id number] DOB: Mar 09, 1966  Subjective:  HPI  Patient is here to follow up Last office visit was on 06/15/17 for nausea, belching-at that time patient was advised to take Zantac and Phenergan for symptoms. Multiple labs have been done since that visit : CBC, MEtC, H.pylori, Hepatic function panel, CMV, Epstein-Barr virus, C Diff, Gastrointestinal panel. 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  Patient is still having nausea, fatigue, belching-is better but he is no eating much. Sleeping a lot. Some increase in DOE. Wt Readings from Last 3 Encounters:  07/03/17 229 lb 6.4 oz (104.1 kg)  06/28/17 230 lb (104.3 kg)  06/15/17 236 lb 6.4 oz (107.2 kg)   Patient Active Problem List   Diagnosis Date Noted  . Insomnia 04/04/2016  . Parkinson's disease (HCC) 08/20/2014  . Benign fibroma of prostate 07/31/2014  . Clinical depression 07/31/2014  . Acid reflux 07/31/2014  . Calculus of kidney 07/31/2014  . Headache, migraine 07/31/2014  . Neurocardiogenic syncope 07/31/2014  . Gastro-esophageal reflux disease without esophagitis 05/14/2014  . Bleeding per rectum 05/14/2014  . Beat, premature ventricular 04/08/2014  . Atrial fibrillation and flutter (HCC) 04/08/2014  . BP (high blood pressure) 03/25/2014  . Awareness of heartbeats 03/25/2014  . NS (nuclear sclerosis) 02/05/2014  . Atelectasis 08/04/1983    Past Medical History:  Diagnosis Date  . Anxiety   . Arthritis    hand   . BPH (benign prostatic hyperplasia)   . Collapsed lung    left lung- 1984   . CTS (carpal tunnel syndrome)   . Depression   . Dysrhythmia    pvc's per pt on no medication   . GERD (gastroesophageal reflux disease)   . History of kidney stones   . Migraines   . Parkinson's disease (HCC)   . REM  behavioral disorder   . Restless legs syndrome (RLS)   . Syncopal episodes    08/2014 - followed by Dr Sherryll Burger at [place], abnormal EEG- - note 08/20/14 in Care Everywhere )    . Thyroid disease   . Tremor   . Vasovagal episode     Social History   Social History  . Marital status: Married    Spouse name: N/A  . Number of children: N/A  . Years of education: N/A   Occupational History  . Not on file.   Social History Main Topics  . Smoking status: Never Smoker  . Smokeless tobacco: Never Used  . Alcohol use Yes     Comment: rare- 2 beers per month   . Drug use: No  . Sexual activity: Not on file   Other Topics Concern  . Not on file   Social History Narrative  . No narrative on file    Outpatient Encounter Prescriptions as of 07/03/2017  Medication Sig Note  . Ascorbic Acid (VITAMIN C) 1000 MG tablet Take by mouth. 08/29/2016: Received from: [facility] Received Sig: Take 1,000 mg by mouth once daily.  . cholecalciferol (VITAMIN D) 1000 units tablet Take 5,000 Units by mouth daily. Take 5,000 units daily   . Cyanocobalamin (RA VITAMIN B-12 TR) 1000 MCG TBCR Take by mouth. 08/29/2016: Received from: [facility] Received Sig: Take 1,000 mcg by mouth once daily.  . fluticasone (FLONASE) 50 MCG/ACT nasal spray Instill 2 sprays in each nostril  once daily.   Marland Kitchen gabapentin (NEURONTIN) 300 MG capsule Take 600 mg by mouth daily. Take 300 mg in the afternoon and 600 mg at night 11/25/2015: Received from: External Pharmacy  . omeprazole (PRILOSEC) 20 MG capsule Take 1 capsule by mouth every morning.   . ondansetron (ZOFRAN) 4 MG tablet    . oxyCODONE-acetaminophen (PERCOCET/ROXICET) 5-325 MG tablet    . oxymetazoline (AFRIN) 0.05 % nasal spray Place 2 sprays into both nostrils 2 (two) times daily as needed for congestion.   . promethazine (PHENERGAN) 25 MG tablet Take 1 tablet (25 mg total) by mouth every 6 (six) hours as needed for nausea or  vomiting.   . ranitidine (ZANTAC) 150 MG tablet Take 1 tablet (150 mg total) by mouth 2 (two) times daily.   . sertraline (ZOLOFT) 50 MG tablet Take 1 tablet (50 mg total) by mouth daily.   Marland Kitchen triamcinolone cream (KENALOG) 0.1 % Apply 1 application topically 2 (two) times daily.   Marland Kitchen zolpidem (AMBIEN) 10 MG tablet TAKE 1 TABLET BY MOUTH AT BEDTIME AS NEEDED FOR SLEEP    Facility-Administered Encounter Medications as of 07/03/2017  Medication  . promethazine (PHENERGAN) injection 50 mg    Allergies  Allergen Reactions  . Codeine Nausea And Vomiting and Other (See Comments)    Muscle tightness     Review of Systems  Constitutional: Positive for malaise/fatigue.  Eyes: Negative.   Respiratory: Negative.   Cardiovascular: Negative.   Gastrointestinal: Positive for constipation and nausea. Negative for diarrhea and vomiting.       Belching  Musculoskeletal: Positive for joint pain.  Skin: Negative.   Neurological: Positive for weakness.  Endo/Heme/Allergies: Negative.   Psychiatric/Behavioral: Negative.     Objective:  BP 104/78   Pulse 100   Temp 98.2 F (36.8 C)   Resp 16   Wt 229 lb 6.4 oz (104.1 kg)   BMI 29.45 kg/m   Physical Exam  Constitutional: He is oriented to person, place, and time and well-developed, well-nourished, and in no distress. Vital signs are normal. He has a sickly appearance.  HENT:  Head: Normocephalic and atraumatic.  Right Ear: External ear normal.  Left Ear: External ear normal.  Nose: Nose normal.  Eyes: Pupils are equal, round, and reactive to light. Conjunctivae are normal. No scleral icterus.  Neck: No thyromegaly present.  Cardiovascular: Normal rate, regular rhythm, normal heart sounds and intact distal pulses.  Exam reveals no gallop.   No murmur heard. Pulmonary/Chest: Effort normal and breath sounds normal. No respiratory distress. He has no wheezes.  Abdominal: Soft. Bowel sounds are normal. There is tenderness.  Musculoskeletal: He  exhibits no edema.  Left arm in sling.  Neurological: He is alert and oriented to person, place, and time. Gait normal. GCS score is 15.  Skin: Skin is warm and dry.  Psychiatric: Memory, affect and judgment normal.    Assessment and Plan :  1. Nausea Not better. Discussed in detailsall recent results, all symptoms with patient and his wife. Will speak with Dr Jacinto Halim.  2. Belching Negative GI w/u.  3. Positive test for Epstein-Barr virus (EBV) Waiting on CMV results.I think this is an old infection but it could be reactivated. - CBC with Differential/Platelet  4. Other fatigue Repeat lab work today. Symptoms fit with mono-clinically. Will try Prednisone. May need further work up after speaking to Dr Jacinto Halim.Clinically this fits with viral syndrome/mononucleosis. Patient states he does not feel depressed and did not feel depressed before starting to  feel bad, patient just "tired of feeling bad." Consider referring to cardiology as cardiomyopathy could be the etiology of feeling so badly. - CBC with Differential/Platelet - Comprehensive metabolic panel - TSH Try steroid taper. 5. Elevated liver enzymes Will re check, levels are improving as of last lab work results. - Comprehensive metabolic panel 6. Recent shoulder surgery  HPI, Exam and A&P transcribed by Domingo Cocking, RMA under direction and in the presence of Julieanne Manson, MD. I have done the exam and reviewed the chart and it is accurate to the best of my knowledge. Dentist has been used and  any errors in dictation or transcription are unintentional. Julieanne Manson M.D. [facility]

## 2017-07-04 LAB — COMPREHENSIVE METABOLIC PANEL
AG Ratio: 1.6 (calc) (ref 1.0–2.5)
ALT: 20 U/L (ref 9–46)
AST: 17 U/L (ref 10–35)
Albumin: 4.2 g/dL (ref 3.6–5.1)
Alkaline phosphatase (APISO): 104 U/L (ref 40–115)
BUN: 19 mg/dL (ref 7–25)
CHLORIDE: 104 mmol/L (ref 98–110)
CO2: 26 mmol/L (ref 20–32)
Calcium: 9.4 mg/dL (ref 8.6–10.3)
Creat: 1.03 mg/dL (ref 0.70–1.33)
GLOBULIN: 2.7 g/dL (ref 1.9–3.7)
GLUCOSE: 88 mg/dL (ref 65–139)
POTASSIUM: 4.5 mmol/L (ref 3.5–5.3)
SODIUM: 139 mmol/L (ref 135–146)
TOTAL PROTEIN: 6.9 g/dL (ref 6.1–8.1)
Total Bilirubin: 0.5 mg/dL (ref 0.2–1.2)

## 2017-07-04 LAB — CBC WITH DIFFERENTIAL/PLATELET
BASOS ABS: 70 {cells}/uL (ref 0–200)
Basophils Relative: 1 %
EOS ABS: 616 {cells}/uL — AB (ref 15–500)
EOS PCT: 8.8 %
HEMATOCRIT: 44 % (ref 38.5–50.0)
HEMOGLOBIN: 14.7 g/dL (ref 13.2–17.1)
Lymphs Abs: 1974 cells/uL (ref 850–3900)
MCH: 30.6 pg (ref 27.0–33.0)
MCHC: 33.4 g/dL (ref 32.0–36.0)
MCV: 91.5 fL (ref 80.0–100.0)
MONOS PCT: 9.5 %
MPV: 9.9 fL (ref 7.5–12.5)
NEUTROS ABS: 3675 {cells}/uL (ref 1500–7800)
NEUTROS PCT: 52.5 %
Platelets: 366 10*3/uL (ref 140–400)
RBC: 4.81 10*6/uL (ref 4.20–5.80)
RDW: 12.4 % (ref 11.0–15.0)
Total Lymphocyte: 28.2 %
WBC mixed population: 665 cells/uL (ref 200–950)
WBC: 7 10*3/uL (ref 3.8–10.8)

## 2017-07-04 LAB — EPSTEIN-BARR VIRUS VCA ANTIBODY PANEL
EBV NA IgG: 276 U/mL — ABNORMAL HIGH
EBV VCA IgG: 344 U/mL — ABNORMAL HIGH
EBV VCA IgM: <36 U/mL

## 2017-07-04 LAB — TSH: TSH: 0.46 m[IU]/L (ref 0.40–4.50)

## 2017-07-04 LAB — CMV ABS, IGG+IGM (CYTOMEGALOVIRUS)
CMV IgM: <30 AU/mL
Cytomegalovirus Ab-IgG: 5.9 U/mL — ABNORMAL HIGH

## 2017-07-06 ENCOUNTER — Other Ambulatory Visit: Payer: Self-pay | Admitting: Family Medicine

## 2017-07-06 DIAGNOSIS — R079 Chest pain, unspecified: Secondary | ICD-10-CM

## 2017-07-07 DIAGNOSIS — R55 Syncope and collapse: Secondary | ICD-10-CM | POA: Diagnosis not present

## 2017-07-07 DIAGNOSIS — I10 Essential (primary) hypertension: Secondary | ICD-10-CM | POA: Diagnosis not present

## 2017-07-07 DIAGNOSIS — I493 Ventricular premature depolarization: Secondary | ICD-10-CM | POA: Diagnosis not present

## 2017-07-07 DIAGNOSIS — R079 Chest pain, unspecified: Secondary | ICD-10-CM | POA: Diagnosis not present

## 2017-07-13 ENCOUNTER — Other Ambulatory Visit: Payer: Self-pay

## 2017-07-13 MED ORDER — OMEPRAZOLE 40 MG PO CPDR: 40.0000 mg | DELAYED_RELEASE_CAPSULE | Freq: Every day | ORAL | 12 refills | Status: DC

## 2017-07-13 MED ORDER — PREDNISONE 5 MG (48) PO TBPK: ORAL_TABLET | ORAL | 0 refills | Status: DC

## 2017-07-13 NOTE — Progress Notes (Signed)
Per Dr Sullivan Lone send in RX for Prednisone 5 mg 12 day taper and increase Omeprazole to taking 40 mg instead of 20 mg, continue Zantac. Patient was advised of all of this. RXs sent in-Anastasiya Ander Purpura, RMA

## 2017-07-18 DIAGNOSIS — R0602 Shortness of breath: Secondary | ICD-10-CM | POA: Diagnosis not present

## 2017-07-18 DIAGNOSIS — R600 Localized edema: Secondary | ICD-10-CM | POA: Diagnosis not present

## 2017-07-21 DIAGNOSIS — R55 Syncope and collapse: Secondary | ICD-10-CM | POA: Diagnosis not present

## 2017-07-21 DIAGNOSIS — I10 Essential (primary) hypertension: Secondary | ICD-10-CM | POA: Diagnosis not present

## 2017-07-21 DIAGNOSIS — R600 Localized edema: Secondary | ICD-10-CM | POA: Diagnosis not present

## 2017-07-21 DIAGNOSIS — R0602 Shortness of breath: Secondary | ICD-10-CM | POA: Diagnosis not present

## 2017-07-25 DIAGNOSIS — M25512 Pain in left shoulder: Secondary | ICD-10-CM | POA: Diagnosis not present

## 2017-07-25 DIAGNOSIS — M25612 Stiffness of left shoulder, not elsewhere classified: Secondary | ICD-10-CM | POA: Diagnosis not present

## 2017-07-27 DIAGNOSIS — M25612 Stiffness of left shoulder, not elsewhere classified: Secondary | ICD-10-CM | POA: Diagnosis not present

## 2017-07-27 DIAGNOSIS — M25512 Pain in left shoulder: Secondary | ICD-10-CM | POA: Diagnosis not present

## 2017-07-27 DIAGNOSIS — M6281 Muscle weakness (generalized): Secondary | ICD-10-CM | POA: Diagnosis not present

## 2017-08-01 ENCOUNTER — Encounter: Payer: Self-pay | Admitting: Family Medicine

## 2017-08-01 ENCOUNTER — Ambulatory Visit (INDEPENDENT_AMBULATORY_CARE_PROVIDER_SITE_OTHER): Payer: BLUE CROSS/BLUE SHIELD | Admitting: Family Medicine

## 2017-08-01 VITALS — BP 90/60 | HR 64 | Temp 98.3°F | Resp 16 | Wt 228.0 lb

## 2017-08-01 DIAGNOSIS — R110 Nausea: Secondary | ICD-10-CM

## 2017-08-01 DIAGNOSIS — R0683 Snoring: Secondary | ICD-10-CM | POA: Diagnosis not present

## 2017-08-01 DIAGNOSIS — R682 Dry mouth, unspecified: Secondary | ICD-10-CM

## 2017-08-01 DIAGNOSIS — K219 Gastro-esophageal reflux disease without esophagitis: Secondary | ICD-10-CM | POA: Diagnosis not present

## 2017-08-01 DIAGNOSIS — R5383 Other fatigue: Secondary | ICD-10-CM | POA: Diagnosis not present

## 2017-08-01 MED ORDER — SUCRALFATE 1 G PO TABS: 1.0000 g | ORAL_TABLET | Freq: Four times a day (QID) | ORAL | 1 refills | Status: DC

## 2017-08-01 NOTE — Progress Notes (Signed)
Patient: Kurt Maldonado Male    DOB: 14-Oct-1965   51 y.o.   MRN: [id number] Visit Date: 08/01/2017  Today's Provider: Megan Mans, MD   Chief Complaint  Patient presents with  . Nausea   Subjective:    HPI   [name] presents for follow up. He was last seen 07/03/2017. He is still c/o nausea and fatigue. He denies vomiting. He is still belching. He is taking Phenergan and Zantac, without much relief. He was referred to GI and Cardio for work up, both were normal. Labs were checked at [facility]. He is c/o new dry mouth.  Epworth Sleepiness Scale:   Sitting and reading: Moderate chance of dozing  Watching TV: Moderate chance of dozing  Sitting, inactive in a public place (e.g. a theatre or a meeting): Slight chance of dozing  As a passenger in a car for an hour without a break: Moderate chance of dozing  Lying down to rest in the afternoon when circumstances permit: High chance of dozing  Sitting and talking to someone: Would never doze  Sitting quietly after a lunch without alcohol: Moderate chance of dozing  In a car, while stopped for a few minutes in traffic: Would never doze   Total score: 12  Depression screen PHQ 2/9 08/01/2017  Decreased Interest 2  Down, Depressed, Hopeless 2  PHQ - 2 Score 4  Altered sleeping 0  Tired, decreased energy 3  Change in appetite 2  Feeling bad or failure about yourself  1  Trouble concentrating 0  Moving slowly or fidgety/restless 0  Suicidal thoughts 0  PHQ-9 Score 10  Difficult doing work/chores Very difficult    Allergies  Allergen Reactions  . Codeine Nausea And Vomiting and Other (See Comments)    Muscle tightness      Current Outpatient Prescriptions:  .  Ascorbic Acid (VITAMIN C) 1000 MG tablet, Take by mouth., Disp: , Rfl:  .  cholecalciferol (VITAMIN D) 1000 units tablet, Take 5,000 Units by mouth daily. Take 5,000 units daily, Disp: , Rfl:  .  Cyanocobalamin (RA VITAMIN B-12 TR) 1000 MCG  TBCR, Take by mouth., Disp: , Rfl:  .  gabapentin (NEURONTIN) 300 MG capsule, Take 600 mg by mouth daily. Take 300 mg in the afternoon and 600 mg at night, Disp: , Rfl:  .  omeprazole (PRILOSEC) 40 MG capsule, Take 1 capsule (40 mg total) by mouth daily., Disp: 30 capsule, Rfl: 12 .  promethazine (PHENERGAN) 25 MG tablet, Take 1 tablet (25 mg total) by mouth every 6 (six) hours as needed for nausea or vomiting., Disp: 60 tablet, Rfl: 1 .  ranitidine (ZANTAC) 150 MG tablet, Take 1 tablet (150 mg total) by mouth 2 (two) times daily., Disp: 60 tablet, Rfl: 5 .  sertraline (ZOLOFT) 50 MG tablet, Take 1 tablet (50 mg total) by mouth daily., Disp: 90 tablet, Rfl: 3 .  zolpidem (AMBIEN) 10 MG tablet, TAKE 1 TABLET BY MOUTH AT BEDTIME AS NEEDED FOR SLEEP, Disp: 30 tablet, Rfl: 5 .  fluticasone (FLONASE) 50 MCG/ACT nasal spray, Instill 2 sprays in each nostril once daily. (Patient not taking: Reported on 08/01/2017), Disp: 16 g, Rfl: 11 .  oxyCODONE-acetaminophen (PERCOCET/ROXICET) 5-325 MG tablet, , Disp: , Rfl:  .  oxymetazoline (AFRIN) 0.05 % nasal spray, Place 2 sprays into both nostrils 2 (two) times daily as needed for congestion., Disp: , Rfl:  .  triamcinolone cream (KENALOG) 0.1 %, Apply 1 application topically 2 (  two) times daily. (Patient not taking: Reported on 08/01/2017), Disp: 30 g, Rfl: 0  Current Facility-Administered Medications:  .  promethazine (PHENERGAN) injection 50 mg, 50 mg, Intramuscular, Q6H PRN, Maple HudsonGilbert, Korry L Jr., MD, 50 mg at 06/15/17 0813  Review of Systems  Constitutional: Positive for appetite change, chills, fatigue and unexpected weight change. Negative for activity change, diaphoresis and fever.  Respiratory: Negative for shortness of breath.   Cardiovascular: Positive for palpitations (unchanged since seeing cardiology). Negative for chest pain.  Gastrointestinal: Positive for nausea. Negative for vomiting.  Endocrine: Positive for polydipsia ( due to dry mouth ).  Negative for cold intolerance, heat intolerance, polyphagia and polyuria.  Neurological: Positive for light-headedness. Negative for syncope and headaches.  Psychiatric/Behavioral: Negative.     Social History  Substance Use Topics  . Smoking status: Never Smoker  . Smokeless tobacco: Never Used  . Alcohol use Yes     Comment: rare- 2 beers per month    Objective:   BP 90/60 (BP Location: Right Arm, Patient Position: Sitting, Cuff Size: Large)   Pulse 64   Temp 98.3 F (36.8 C) (Oral)   Resp 16   Wt 228 lb (103.4 kg)   BMI 29.27 kg/m  Vitals:   08/01/17 1649  BP: 90/60  Pulse: 64  Resp: 16  Temp: 98.3 F (36.8 C)  TempSrc: Oral  Weight: 228 lb (103.4 kg)     Physical Exam  Constitutional: He is oriented to person, place, and time. He appears well-developed and well-nourished.  HENT:  Head: Normocephalic and atraumatic.  Right Ear: External ear normal.  Left Ear: External ear normal.  Nose: Nose normal.  Mouth/Throat: Mucous membranes are dry.  Eyes: Conjunctivae are normal. No scleral icterus.  Neck: Normal range of motion. Neck supple. No thyromegaly present.  Cardiovascular: Normal rate, regular rhythm and normal heart sounds.   Pulmonary/Chest: Effort normal and breath sounds normal. No respiratory distress.  Abdominal: Soft. He exhibits no distension. There is no tenderness.  Lymphadenopathy:    He has no cervical adenopathy.  Neurological: He is alert and oriented to person, place, and time.  Skin: Skin is warm and dry.  Psychiatric: He has a normal mood and affect. His behavior is normal. Judgment and thought content normal.        Assessment & Plan:     1. Gastro-esophageal reflux disease without esophagitis  - sucralfate (CARAFATE) 1 g tablet; Take 1 tablet (1 g total) by mouth 4 (four) times daily.  Dispense: 120 tablet; Refill: 1  2. Snoring Home sleep study. - Nocturnal polysomnography (NPSG); Future  3. Fatigue, unspecified type  - CBC  with Differential/Platelet - Comprehensive metabolic panel - TSH - ANA - Sedimentation rate - CMV abs, IgG+IgM (cytomegalovirus) - Epstein-Barr virus VCA antibody panel - Nocturnal polysomnography (NPSG); Future  4. Dry mouth   5. Nausea Consider surgical referral for possible gallbladder disease. - sucralfate (CARAFATE) 1 g tablet; Take 1 tablet (1 g total) by mouth 4 (four) times daily.  Dispense: 120 tablet; Refill: 1 - Ambulatory referral to General Surgery 6.Mono Syndrome  I have done the exam and reviewed the chart and it is accurate to the best of my knowledge. DentistDragon  technology has been used and  any errors in dictation or transcription are unintentional. Julieanne Mansonichard Gilbert M.D. [facility]       Megan Mansichard Gilbert Jr, MD  [facility]

## 2017-08-02 ENCOUNTER — Ambulatory Visit (INDEPENDENT_AMBULATORY_CARE_PROVIDER_SITE_OTHER): Payer: BLUE CROSS/BLUE SHIELD | Admitting: Family Medicine

## 2017-08-02 DIAGNOSIS — R5383 Other fatigue: Secondary | ICD-10-CM | POA: Diagnosis not present

## 2017-08-02 DIAGNOSIS — B2790 Infectious mononucleosis, unspecified without complication: Secondary | ICD-10-CM

## 2017-08-02 MED ORDER — METHYLPREDNISOLONE ACETATE 80 MG/ML IJ SUSP
80.0000 mg | Freq: Once | INTRAMUSCULAR | Status: AC
  Administered 2017-08-02: 80 mg via INTRAMUSCULAR

## 2017-08-03 DIAGNOSIS — R194 Change in bowel habit: Secondary | ICD-10-CM | POA: Diagnosis not present

## 2017-08-03 DIAGNOSIS — R140 Abdominal distension (gaseous): Secondary | ICD-10-CM | POA: Diagnosis not present

## 2017-08-03 DIAGNOSIS — R110 Nausea: Secondary | ICD-10-CM | POA: Diagnosis not present

## 2017-08-03 DIAGNOSIS — K210 Gastro-esophageal reflux disease with esophagitis: Secondary | ICD-10-CM | POA: Diagnosis not present

## 2017-08-03 DIAGNOSIS — L308 Other specified dermatitis: Secondary | ICD-10-CM | POA: Diagnosis not present

## 2017-08-04 ENCOUNTER — Other Ambulatory Visit: Payer: Self-pay | Admitting: Internal Medicine

## 2017-08-04 DIAGNOSIS — R140 Abdominal distension (gaseous): Secondary | ICD-10-CM

## 2017-08-04 DIAGNOSIS — R110 Nausea: Secondary | ICD-10-CM

## 2017-08-04 LAB — CBC WITH DIFFERENTIAL/PLATELET
BASOS ABS: 42 {cells}/uL (ref 0–200)
BASOS PCT: 0.7 %
EOS ABS: 348 {cells}/uL (ref 15–500)
Eosinophils Relative: 5.8 %
HCT: 42.4 % (ref 38.5–50.0)
Hemoglobin: 14.5 g/dL (ref 13.2–17.1)
Lymphs Abs: 1530 cells/uL (ref 850–3900)
MCH: 31.3 pg (ref 27.0–33.0)
MCHC: 34.2 g/dL (ref 32.0–36.0)
MCV: 91.6 fL (ref 80.0–100.0)
MONOS PCT: 7.5 %
MPV: 9.9 fL (ref 7.5–12.5)
NEUTROS PCT: 60.5 %
Neutro Abs: 3630 cells/uL (ref 1500–7800)
PLATELETS: 271 10*3/uL (ref 140–400)
RBC: 4.63 10*6/uL (ref 4.20–5.80)
RDW: 12.6 % (ref 11.0–15.0)
Total Lymphocyte: 25.5 %
WBC: 6 10*3/uL (ref 3.8–10.8)
WBCMIX: 450 {cells}/uL (ref 200–950)

## 2017-08-04 LAB — COMPLETE METABOLIC PANEL WITH GFR
AG Ratio: 1.5 (calc) (ref 1.0–2.5)
ALBUMIN MSPROF: 4.1 g/dL (ref 3.6–5.1)
ALKALINE PHOSPHATASE (APISO): 99 U/L (ref 40–115)
ALT: 37 U/L (ref 9–46)
AST: 25 U/L (ref 10–35)
BILIRUBIN TOTAL: 0.5 mg/dL (ref 0.2–1.2)
BUN: 17 mg/dL (ref 7–25)
CHLORIDE: 106 mmol/L (ref 98–110)
CO2: 29 mmol/L (ref 20–32)
Calcium: 9.3 mg/dL (ref 8.6–10.3)
Creat: 1.02 mg/dL (ref 0.70–1.33)
GFR, Est African American: 97 mL/min/{1.73_m2} (ref >=60)
GFR, Est Non African American: 84 mL/min/{1.73_m2} (ref >=60)
GLUCOSE: 78 mg/dL (ref 65–99)
Globulin: 2.7 g/dL (calc) (ref 1.9–3.7)
Potassium: 4.3 mmol/L (ref 3.5–5.3)
Sodium: 144 mmol/L (ref 135–146)
Total Protein: 6.8 g/dL (ref 6.1–8.1)

## 2017-08-04 LAB — EPSTEIN-BARR VIRUS VCA ANTIBODY PANEL
EBV NA IGG: 276 U/mL — AB
EBV VCA IGG: 362 U/mL — AB

## 2017-08-04 LAB — CMV ABS, IGG+IGM (CYTOMEGALOVIRUS): Cytomegalovirus Ab-IgG: 5.4 U/mL — ABNORMAL HIGH

## 2017-08-04 LAB — ANA: Anti Nuclear Antibody(ANA): NEGATIVE

## 2017-08-04 LAB — TSH: TSH: 0.77 m[IU]/L (ref 0.40–4.50)

## 2017-08-04 LAB — SEDIMENTATION RATE: SED RATE: 9 mm/h (ref 0–20)

## 2017-08-07 ENCOUNTER — Ambulatory Visit (INDEPENDENT_AMBULATORY_CARE_PROVIDER_SITE_OTHER): Payer: BLUE CROSS/BLUE SHIELD | Admitting: General Surgery

## 2017-08-07 ENCOUNTER — Encounter: Payer: Self-pay | Admitting: General Surgery

## 2017-08-07 VITALS — BP 130/70 | HR 78 | Resp 14 | Ht 74.0 in | Wt 227.0 lb

## 2017-08-07 DIAGNOSIS — R110 Nausea: Secondary | ICD-10-CM | POA: Diagnosis not present

## 2017-08-07 DIAGNOSIS — R143 Flatulence: Secondary | ICD-10-CM | POA: Diagnosis not present

## 2017-08-07 NOTE — Progress Notes (Signed)
Patient ID: Kurt Prestoichard T How, male   DOB: 11-02-1965, 51 y.o.   MRN: [id number]  Chief Complaint  Patient presents with  . Other    nausea    HPI Kurt Maldonado is a 51 y.o. male here for evaluation of nausea. Nausea is constant, also reports excessive belching. He does not think it is related to specific foods or time of day. He was recently diagnosed with Mono on September 7th. He is still very fatigued and the constant nausea has not improved. Has been using phenergan and reglan for nausea. He does get diarrhea that comes and goes. He has seen Dr. Norma Fredricksonoledo whom ordered a HIDA scan. He had an abdominal ultrasound done on 06/20/17. He has a HIDA scan scheduled for 08/15/17.  HPI  Past Medical History:  Diagnosis Date  . Anxiety   . Arthritis    hand   . BPH (benign prostatic hyperplasia)   . Collapsed lung    left lung- 1984   . CTS (carpal tunnel syndrome)   . Depression   . Dysrhythmia    pvc's per pt on no medication   . GERD (gastroesophageal reflux disease)   . History of kidney stones   . Migraines   . Parkinson's disease (HCC)   . REM behavioral disorder   . Restless legs syndrome (RLS)   . Syncopal episodes    08/2014 - [facility], abnormal [facility]- - note 08/20/14 in Care Everywhere )    . Thyroid disease   . Tremor   . Vasovagal episode     Past Surgical History:  Procedure Laterality Date  . APPENDECTOMY    . COLONOSCOPY  05/27/2014   adenomatous polyps and FH colon polyp-repeat 5 years per Dr Shelle Ironein  . ESOPHAGOGASTRODUODENOSCOPY  05/27/2014   Esophagitis-no repeat per Dr Shelle Ironein   . SHOULDER SURGERY     bilateral     Family History  Problem Relation Age of Onset  . Dementia Mother   . Cataracts Mother   . Osteoporosis Mother   . Arthritis Mother   . Alzheimer's disease Mother   . High blood pressure Mother   . Migraines Mother   . Hypertension Father   . Kidney cancer Father   . Diabetes Mellitus II Father   . Prostate cancer Father    . Colon polyps Father   . Graves' disease Other   . Parkinson's disease Paternal Grandmother   . Tremor Paternal Grandmother   . Colon cancer Neg Hx     Social History Social History   Tobacco Use  . Smoking status: Never Smoker  . Smokeless tobacco: Never Used  Substance Use Topics  . Alcohol use: Yes    Comment: rare- 2 beers per month   . Drug use: No    Allergies  Allergen Reactions  . Codeine Nausea And Vomiting and Other (See Comments)    Muscle tightness     Current Outpatient Medications  Medication Sig Dispense Refill  . Ascorbic Acid (VITAMIN C) 1000 MG tablet Take by mouth.    . cholecalciferol (VITAMIN D) 1000 units tablet Take 5,000 Units by mouth daily. Take 5,000 units daily    . Cyanocobalamin (RA VITAMIN B-12 TR) 1000 MCG TBCR Take by mouth.    . fluticasone (FLONASE) 50 MCG/ACT nasal spray Instill 2 sprays in each nostril once daily. 16 g 11  . gabapentin (NEURONTIN) 300 MG capsule Take 600 mg by mouth daily. Take 300 mg in the  afternoon and 600 mg at night    . metoCLOPramide (REGLAN) 5 MG tablet Take 5 mg 4 (four) times daily as needed by mouth.  1  . omeprazole (PRILOSEC) 40 MG capsule Take 1 capsule (40 mg total) by mouth daily. 30 capsule 12  . oxymetazoline (AFRIN) 0.05 % nasal spray Place 2 sprays into both nostrils 2 (two) times daily as needed for congestion.    . ranitidine (ZANTAC) 150 MG tablet Take 1 tablet (150 mg total) by mouth 2 (two) times daily. 60 tablet 5  . sertraline (ZOLOFT) 50 MG tablet Take 1 tablet (50 mg total) by mouth daily. 90 tablet 3  . sucralfate (CARAFATE) 1 g tablet Take 1 tablet (1 g total) by mouth 4 (four) times daily. 120 tablet 1  . triamcinolone cream (KENALOG) 0.1 % Apply 1 application topically 2 (two) times daily. 30 g 0  . zolpidem (AMBIEN) 10 MG tablet TAKE 1 TABLET BY MOUTH AT BEDTIME AS NEEDED FOR SLEEP 30 tablet 5  . promethazine (PHENERGAN) 25 MG tablet Take 1 tablet (25 mg total) by mouth every 6 (six)  hours as needed for nausea or vomiting. (Patient not taking: Reported on 08/07/2017) 60 tablet 1   Current Facility-Administered Medications  Medication Dose Route Frequency Provider Last Rate Last Dose  . promethazine (PHENERGAN) injection 50 mg  50 mg Intramuscular Q6H PRN [name], MD   50 mg at 06/15/17 0813    Review of Systems Review of Systems  Constitutional: Negative.   Respiratory: Negative.   Gastrointestinal: Positive for abdominal pain (cramping), nausea and vomiting. Negative for abdominal distention, anal bleeding, blood in stool, constipation, diarrhea and rectal pain.    Blood pressure 130/70, pulse 78, resp. rate 14, height 6\' 2"  (1.88 m), weight 227 lb (103 kg).  Physical Exam Physical Exam  Constitutional: He is oriented to person, place, and time. He appears well-developed and well-nourished.  Eyes: Conjunctivae are normal. No scleral icterus.  Neck: Neck supple.  Cardiovascular: Normal rate, regular rhythm and normal heart sounds.  Pulmonary/Chest: Effort normal and breath sounds normal.  Abdominal: Soft. Normal appearance and bowel sounds are normal. There is no hepatosplenomegaly. There is tenderness (mild in RUQ, moderate in LUQ and epigastric area. Murphys sign absent). No hernia. Hernia confirmed negative in the ventral area, confirmed negative in the right inguinal area and confirmed negative in the left inguinal area.  Lymphadenopathy:    He has no cervical adenopathy.    He has no axillary adenopathy.  Neurological: He is alert and oriented to person, place, and time.  Skin: Skin is warm and dry.  Psychiatric: He has a normal mood and affect.    Data Reviewed Notes, ultrasound RUQ abdominal RUQ abdominal US- No gallstones or wall thickening visualized. Murphy sign absent. CBD diameter 4mm.  HIDA Scan 12/25/15- Cystic and CBD are patent. Gallbladder ejection fraction within normal limits. Upper endoscopy 06/28/17-  Dr. Norma Fredrickson; Esophagus was  normal. Localized mild inflammation characterized by erythema was found in the stomach.  Surgical Pathology 06/28/17: -Antrum of stomach revealed changes consistent with mild chronic gastritis. Negative for H. Pylori, dysplasia, and malignancy.  - Esophagus showed rare eosinophils that could be compatible with mild reflux, correlation with endoscopic findings required. Negative for dysplasia and malignancy.    Assessment    Pt with nausea, excessive belching, and non-specific upper quadrant abdominal pain. Surgical pathology from EGD 06/28/17 showed chronic gastritis of antrum of stomach. Negative for H. Pylori. Stomach and esophagus  negative for dysplasia and malignancy.  No gallstones on [place]. HIDA scan ordered to evaluate for gallbladder origin, if negative consider further imaging to evaluate for motility disorders.    Plan    Follow up after HIDA scan.    HPI, Physical Exam, Assessment and Plan have been scribed under the direction and in the presence of Kathreen Cosier, MD  Carron Brazen, LPN  I have completed the exam and reviewed the above documentation for accuracy and completeness.  I agree with the above.  Museum/gallery conservator has been used and any errors in dictation or transcription are unintentional.  [facility] G. Evette Cristal, M.D., F.A.C.S.  Gerlene Burdock G 08/07/2017, 5:19 PM

## 2017-08-08 ENCOUNTER — Telehealth: Payer: Self-pay | Admitting: Family Medicine

## 2017-08-08 NOTE — Telephone Encounter (Signed)
Order for PSG faxed to [facility]

## 2017-08-09 ENCOUNTER — Other Ambulatory Visit: Payer: Self-pay | Admitting: Emergency Medicine

## 2017-08-09 MED ORDER — VALACYCLOVIR HCL 1 G PO TABS
1000.0000 mg | ORAL_TABLET | Freq: Three times a day (TID) | ORAL | 0 refills | Status: DC
Start: 2017-08-09 — End: 2020-03-12

## 2017-08-09 NOTE — Progress Notes (Signed)
Per Dr. Sullivan LoneGilbert verbal order, sent in valtrex TID for a week for pt.

## 2017-08-12 ENCOUNTER — Ambulatory Visit: Payer: Self-pay | Admitting: Family Medicine

## 2017-08-15 ENCOUNTER — Ambulatory Visit
Admission: RE | Admit: 2017-08-15 | Discharge: 2017-08-15 | Disposition: A | Discharge status: Home / Self Care | Payer: BLUE CROSS/BLUE SHIELD | Source: Ambulatory Visit | Attending: Internal Medicine | Admitting: Internal Medicine

## 2017-08-15 DIAGNOSIS — R140 Abdominal distension (gaseous): Secondary | ICD-10-CM | POA: Diagnosis not present

## 2017-08-15 DIAGNOSIS — R110 Nausea: Secondary | ICD-10-CM | POA: Insufficient documentation

## 2017-08-15 DIAGNOSIS — R1011 Right upper quadrant pain: Secondary | ICD-10-CM | POA: Diagnosis not present

## 2017-08-15 DIAGNOSIS — R932 Abnormal findings on diagnostic imaging of liver and biliary tract: Secondary | ICD-10-CM | POA: Insufficient documentation

## 2017-08-15 MED ORDER — TECHNETIUM TC 99M MEBROFENIN IV KIT
5.0000 | PACK | Freq: Once | INTRAVENOUS | Status: AC | PRN
  Administered 2017-08-15: 5.46 via INTRAVENOUS

## 2017-08-17 DIAGNOSIS — M25512 Pain in left shoulder: Secondary | ICD-10-CM | POA: Diagnosis not present

## 2017-08-17 DIAGNOSIS — M25612 Stiffness of left shoulder, not elsewhere classified: Secondary | ICD-10-CM | POA: Diagnosis not present

## 2017-08-17 DIAGNOSIS — M6281 Muscle weakness (generalized): Secondary | ICD-10-CM | POA: Diagnosis not present

## 2017-08-21 DIAGNOSIS — M25512 Pain in left shoulder: Secondary | ICD-10-CM | POA: Diagnosis not present

## 2017-08-21 DIAGNOSIS — K811 Chronic cholecystitis: Secondary | ICD-10-CM | POA: Diagnosis not present

## 2017-08-21 DIAGNOSIS — M25612 Stiffness of left shoulder, not elsewhere classified: Secondary | ICD-10-CM | POA: Diagnosis not present

## 2017-08-21 DIAGNOSIS — M6281 Muscle weakness (generalized): Secondary | ICD-10-CM | POA: Diagnosis not present

## 2017-08-23 ENCOUNTER — Ambulatory Visit
Admission: RE | Admit: 2017-08-23 | Discharge: 2017-08-23 | Disposition: A | Discharge status: Home / Self Care | Payer: BLUE CROSS/BLUE SHIELD | Source: Ambulatory Visit | Attending: Surgery | Admitting: Surgery

## 2017-08-23 ENCOUNTER — Ambulatory Visit: Payer: BLUE CROSS/BLUE SHIELD | Admitting: Anesthesiology

## 2017-08-23 ENCOUNTER — Encounter: Payer: Self-pay | Admitting: Certified Registered Nurse Anesthetist

## 2017-08-23 ENCOUNTER — Ambulatory Visit: Payer: BLUE CROSS/BLUE SHIELD

## 2017-08-23 ENCOUNTER — Encounter
Admission: RE | Disposition: A | Discharge status: Home / Self Care | Payer: Self-pay | Source: Ambulatory Visit | Attending: Surgery

## 2017-08-23 DIAGNOSIS — K811 Chronic cholecystitis: Secondary | ICD-10-CM | POA: Insufficient documentation

## 2017-08-23 DIAGNOSIS — K8020 Calculus of gallbladder without cholecystitis without obstruction: Secondary | ICD-10-CM

## 2017-08-23 DIAGNOSIS — K219 Gastro-esophageal reflux disease without esophagitis: Secondary | ICD-10-CM | POA: Diagnosis not present

## 2017-08-23 DIAGNOSIS — M1990 Unspecified osteoarthritis, unspecified site: Secondary | ICD-10-CM | POA: Diagnosis not present

## 2017-08-23 DIAGNOSIS — F418 Other specified anxiety disorders: Secondary | ICD-10-CM | POA: Diagnosis not present

## 2017-08-23 HISTORY — PX: CHOLECYSTECTOMY: SHX55

## 2017-08-23 SURGERY — LAPAROSCOPIC CHOLECYSTECTOMY WITH INTRAOPERATIVE CHOLANGIOGRAM
Anesthesia: General | Site: Abdomen | Wound class: Clean Contaminated

## 2017-08-23 MED ORDER — GLYCOPYRROLATE 0.2 MG/ML IJ SOLN
INTRAMUSCULAR | Status: DC | PRN
  Administered 2017-08-23: 0.2 mg via INTRAVENOUS

## 2017-08-23 MED ORDER — FAMOTIDINE 20 MG PO TABS: 20.0000 mg | ORAL_TABLET | Freq: Once | ORAL | Status: DC

## 2017-08-23 MED ORDER — EPHEDRINE SULFATE 50 MG/ML IJ SOLN
INTRAMUSCULAR | Status: DC | PRN
  Administered 2017-08-23 (×2): 10 mg via INTRAVENOUS

## 2017-08-23 MED ORDER — KETOROLAC TROMETHAMINE 30 MG/ML IJ SOLN
INTRAMUSCULAR | Status: AC
  Filled 2017-08-23: qty 1

## 2017-08-23 MED ORDER — PROMETHAZINE HCL 25 MG/ML IJ SOLN
INTRAMUSCULAR | Status: AC
  Administered 2017-08-23: 12.5 mg via INTRAVENOUS
  Filled 2017-08-23: qty 1

## 2017-08-23 MED ORDER — SODIUM CHLORIDE 0.9 % IV SOLN
INTRAVENOUS | Status: DC | PRN
  Administered 2017-08-23: 12:00:00

## 2017-08-23 MED ORDER — SODIUM CHLORIDE FLUSH 0.9 % IV SOLN
INTRAVENOUS | Status: AC
  Filled 2017-08-23: qty 10

## 2017-08-23 MED ORDER — FENTANYL CITRATE (PF) 100 MCG/2ML IJ SOLN
INTRAMUSCULAR | Status: AC
  Filled 2017-08-23: qty 2

## 2017-08-23 MED ORDER — MIDAZOLAM HCL 2 MG/2ML IJ SOLN
INTRAMUSCULAR | Status: AC
  Filled 2017-08-23: qty 2

## 2017-08-23 MED ORDER — HEPARIN SODIUM (PORCINE) 1000 UNIT/ML IJ SOLN
INTRAMUSCULAR | Status: DC | PRN
  Administered 2017-08-23: 12:00:00 via INTRAMUSCULAR

## 2017-08-23 MED ORDER — LACTATED RINGERS IV SOLN
Freq: Once | INTRAVENOUS | Status: AC
  Administered 2017-08-23: 11:00:00 via INTRAVENOUS

## 2017-08-23 MED ORDER — PROPOFOL 10 MG/ML IV BOLUS
INTRAVENOUS | Status: DC | PRN
  Administered 2017-08-23: 200 mg via INTRAVENOUS

## 2017-08-23 MED ORDER — ONDANSETRON HCL 4 MG/2ML IJ SOLN
INTRAMUSCULAR | Status: DC | PRN
  Administered 2017-08-23: 4 mg via INTRAVENOUS

## 2017-08-23 MED ORDER — FENTANYL CITRATE (PF) 100 MCG/2ML IJ SOLN
INTRAMUSCULAR | Status: AC
  Administered 2017-08-23: 25 ug via INTRAVENOUS
  Filled 2017-08-23: qty 2

## 2017-08-23 MED ORDER — PHENYLEPHRINE HCL 10 MG/ML IJ SOLN
INTRAMUSCULAR | Status: DC | PRN
  Administered 2017-08-23: 100 ug via INTRAVENOUS
  Administered 2017-08-23: 200 ug via INTRAVENOUS

## 2017-08-23 MED ORDER — ONDANSETRON HCL 4 MG/2ML IJ SOLN
INTRAMUSCULAR | Status: AC
  Filled 2017-08-23: qty 2

## 2017-08-23 MED ORDER — HYDROCODONE-ACETAMINOPHEN 5-325 MG PO TABS: 1.0000 | ORAL_TABLET | Freq: Four times a day (QID) | ORAL | 0 refills | Status: DC | PRN

## 2017-08-23 MED ORDER — FENTANYL CITRATE (PF) 100 MCG/2ML IJ SOLN
INTRAMUSCULAR | Status: DC | PRN
  Administered 2017-08-23: 100 ug via INTRAVENOUS
  Administered 2017-08-23 (×2): 50 ug via INTRAVENOUS

## 2017-08-23 MED ORDER — PROPOFOL 10 MG/ML IV BOLUS
INTRAVENOUS | Status: AC
  Filled 2017-08-23: qty 20

## 2017-08-23 MED ORDER — DEXAMETHASONE SODIUM PHOSPHATE 10 MG/ML IJ SOLN
INTRAMUSCULAR | Status: DC | PRN
  Administered 2017-08-23: 10 mg via INTRAVENOUS

## 2017-08-23 MED ORDER — HEPARIN SODIUM (PORCINE) 5000 UNIT/ML IJ SOLN
INTRAMUSCULAR | Status: AC
  Filled 2017-08-23: qty 1

## 2017-08-23 MED ORDER — SUCCINYLCHOLINE CHLORIDE 20 MG/ML IJ SOLN
INTRAMUSCULAR | Status: DC | PRN
  Administered 2017-08-23: 120 mg via INTRAVENOUS

## 2017-08-23 MED ORDER — LACTATED RINGERS IV SOLN
INTRAVENOUS | Status: DC | PRN
  Administered 2017-08-23: 10:00:00 via INTRAVENOUS

## 2017-08-23 MED ORDER — ROCURONIUM BROMIDE 50 MG/5ML IV SOLN
INTRAVENOUS | Status: AC
  Filled 2017-08-23: qty 1

## 2017-08-23 MED ORDER — MIDAZOLAM HCL 2 MG/2ML IJ SOLN
INTRAMUSCULAR | Status: DC | PRN
  Administered 2017-08-23: 2 mg via INTRAVENOUS

## 2017-08-23 MED ORDER — SUGAMMADEX SODIUM 200 MG/2ML IV SOLN
INTRAVENOUS | Status: AC
  Filled 2017-08-23: qty 2

## 2017-08-23 MED ORDER — LIDOCAINE HCL (PF) 2 % IJ SOLN
INTRAMUSCULAR | Status: AC
  Filled 2017-08-23: qty 10

## 2017-08-23 MED ORDER — FENTANYL CITRATE (PF) 100 MCG/2ML IJ SOLN
25.0000 ug | INTRAMUSCULAR | Status: DC | PRN
Start: 2017-08-23 — End: 2017-08-23
  Administered 2017-08-23: 50 ug via INTRAVENOUS
  Administered 2017-08-23 (×2): 25 ug via INTRAVENOUS

## 2017-08-23 MED ORDER — ROCURONIUM BROMIDE 100 MG/10ML IV SOLN
INTRAVENOUS | Status: DC | PRN
  Administered 2017-08-23: 45 mg via INTRAVENOUS
  Administered 2017-08-23: 5 mg via INTRAVENOUS

## 2017-08-23 MED ORDER — SUGAMMADEX SODIUM 200 MG/2ML IV SOLN
INTRAVENOUS | Status: DC | PRN
Start: 2017-08-23 — End: 2017-08-23
  Administered 2017-08-23: 200 mg via INTRAVENOUS

## 2017-08-23 MED ORDER — PROMETHAZINE HCL 25 MG/ML IJ SOLN
6.2500 mg | INTRAMUSCULAR | Status: DC | PRN
Start: 2017-08-23 — End: 2017-08-23
  Administered 2017-08-23: 12.5 mg via INTRAVENOUS

## 2017-08-23 MED ORDER — MEPERIDINE HCL 50 MG/ML IJ SOLN: 6.2500 mg | INTRAMUSCULAR | Status: DC | PRN

## 2017-08-23 MED ORDER — LIDOCAINE HCL (CARDIAC) 20 MG/ML IV SOLN
INTRAVENOUS | Status: DC | PRN
  Administered 2017-08-23: 100 mg via INTRAVENOUS

## 2017-08-23 MED ORDER — SODIUM CHLORIDE 0.9 % IJ SOLN
INTRAMUSCULAR | Status: AC
  Filled 2017-08-23: qty 50

## 2017-08-23 MED ORDER — DEXAMETHASONE SODIUM PHOSPHATE 10 MG/ML IJ SOLN
INTRAMUSCULAR | Status: AC
  Filled 2017-08-23: qty 1

## 2017-08-23 SURGICAL SUPPLY — 39 items
APPLIER CLIP ROT 10 11.4 M/L (STAPLE) ×3
CANISTER SUCT 1200ML W/VALVE (MISCELLANEOUS) ×3 IMPLANT
CANNULA DILATOR 10 W/SLV (CANNULA) ×2 IMPLANT
CANNULA DILATOR 10MM W/SLV (CANNULA) ×1
CATH REDDICK CHOLANGI 4FR 50CM (CATHETERS) ×3 IMPLANT
CHLORAPREP W/TINT 26ML (MISCELLANEOUS) ×3 IMPLANT
CLIP APPLIE ROT 10 11.4 M/L (STAPLE) ×1 IMPLANT
CLOSURE WOUND 1/2 X4 (GAUZE/BANDAGES/DRESSINGS) ×1
DERMABOND ADVANCED (GAUZE/BANDAGES/DRESSINGS) ×2
DERMABOND ADVANCED .7 DNX12 (GAUZE/BANDAGES/DRESSINGS) ×1 IMPLANT
DRAPE SHEET LG 3/4 BI-LAMINATE (DRAPES) ×3 IMPLANT
ELECT REM PT RETURN 9FT ADLT (ELECTROSURGICAL) ×3
ELECTRODE REM PT RTRN 9FT ADLT (ELECTROSURGICAL) ×1 IMPLANT
GAUZE SPONGE 4X4 12PLY STRL (GAUZE/BANDAGES/DRESSINGS) ×3 IMPLANT
GLOVE BIO SURGEON STRL SZ7.5 (GLOVE) ×3 IMPLANT
GOWN STRL REUS W/ TWL LRG LVL3 (GOWN DISPOSABLE) ×4 IMPLANT
GOWN STRL REUS W/TWL LRG LVL3 (GOWN DISPOSABLE) ×8
IRRIGATION STRYKERFLOW (MISCELLANEOUS) ×1 IMPLANT
IRRIGATOR STRYKERFLOW (MISCELLANEOUS) ×3
IV NS 1000ML (IV SOLUTION) ×2
IV NS 1000ML BAXH (IV SOLUTION) ×1 IMPLANT
KIT RM TURNOVER STRD PROC AR (KITS) ×3 IMPLANT
LABEL OR SOLS (LABEL) ×3 IMPLANT
NDL INSUFF ACCESS 14 VERSASTEP (NEEDLE) ×3 IMPLANT
NEEDLE FILTER BLUNT 18X 1/2SAF (NEEDLE) ×2
NEEDLE FILTER BLUNT 18X1 1/2 (NEEDLE) ×1 IMPLANT
NS IRRIG 500ML POUR BTL (IV SOLUTION) ×3 IMPLANT
PACK LAP CHOLECYSTECTOMY (MISCELLANEOUS) ×3 IMPLANT
SCISSORS METZENBAUM CVD 33 (INSTRUMENTS) ×3 IMPLANT
SEAL FOR SCOPE WARMER C3101 (MISCELLANEOUS) ×3 IMPLANT
SLEEVE ENDOPATH XCEL 5M (ENDOMECHANICALS) ×3 IMPLANT
STRIP CLOSURE SKIN 1/2X4 (GAUZE/BANDAGES/DRESSINGS) ×2 IMPLANT
SUT CHROMIC 5 0 RB 1 27 (SUTURE) ×3 IMPLANT
SUT VIC AB 0 CT2 27 (SUTURE) IMPLANT
SYR 3ML LL SCALE MARK (SYRINGE) ×3 IMPLANT
TROCAR XCEL NON-BLD 11X100MML (ENDOMECHANICALS) ×3 IMPLANT
TROCAR XCEL NON-BLD 5MMX100MML (ENDOMECHANICALS) ×3 IMPLANT
TUBING INSUFFLATION (TUBING) ×3 IMPLANT
WATER STERILE IRR 1000ML POUR (IV SOLUTION) ×3 IMPLANT

## 2017-08-23 NOTE — Discharge Instructions (Addendum)
Take Tylenol or Norco if needed for pain.  Do not drive or do anything dangerous when taking Norco.  Remove dressings on Thursday.  May shower Friday.  Avoid straining and heavy lifting during the first week after surgery.  AMBULATORY SURGERY  DISCHARGE INSTRUCTIONS   1) The drugs that you were given will stay in your system until tomorrow so for the next 24 hours you should not:  A) Drive an automobile B) Make any legal decisions C) Drink any alcoholic beverage   2) You may resume regular meals tomorrow.  Today it is better to start with liquids and gradually work up to solid foods.  You may eat anything you prefer, but it is better to start with liquids, then soup and crackers, and gradually work up to solid foods.   3) Please notify your doctor immediately if you have any unusual bleeding, trouble breathing, redness and pain at the surgery site, drainage, fever, or pain not relieved by medication.    4) Additional Instructions:        Please contact your physician with any problems or Same Day Surgery at [phone], Monday through Friday 6 am to 4 pm, or [place] at [facility] Main number at [phone].

## 2017-08-23 NOTE — Anesthesia Preprocedure Evaluation (Signed)
Anesthesia Evaluation  Patient identified by MRN, date of birth, ID band Patient awake    Reviewed: Allergy & Precautions, NPO status , Patient's Chart, lab work & pertinent test results  History of Anesthesia Complications Negative for: history of anesthetic complications  Airway Mallampati: II  TM Distance: >3 FB Neck ROM: Full    Dental  (+) Caps   Pulmonary Sleep apnea: scheduled to be tested based on suggestive hx. , neg COPD,    breath sounds clear to auscultation- rhonchi (-) wheezing      Cardiovascular Exercise Tolerance: Good hypertension, (-) CAD, (-) Past MI and (-) Cardiac Stents Dysrhythmias: hx of PVCs.  Rhythm:Regular Rate:Normal - Systolic murmurs and - Diastolic murmurs    Neuro/Psych  Headaches, PSYCHIATRIC DISORDERS Anxiety Depression    GI/Hepatic Neg liver ROS, GERD  ,  Endo/Other  negative endocrine ROSneg diabetes  Renal/GU Renal disease: hx of nephrolithiasis.     Musculoskeletal  (+) Arthritis ,   Abdominal (+) - obese,   Peds  Hematology negative hematology ROS (+)   Anesthesia Other Findings Past Medical History: No date: Anxiety No date: Arthritis     Comment:  hand  No date: BPH (benign prostatic hyperplasia) No date: Collapsed lung     Comment:  left lung- 1984  No date: CTS (carpal tunnel syndrome) No date: Depression No date: Dysrhythmia     Comment:  pvc's per pt on no medication  No date: GERD (gastroesophageal reflux disease) No date: History of kidney stones No date: Migraines No date: Parkinson's disease (HCC) No date: REM behavioral disorder No date: Restless legs syndrome (RLS) No date: Syncopal episodes     Comment:  08/2014 - [facility], abnormal [facility]-              - note 08/20/14 in Care Everywhere )   No date: Thyroid disease No date: Tremor No date: Vasovagal episode   Reproductive/Obstetrics                              Anesthesia Physical Anesthesia Plan  ASA: II  Anesthesia Plan: General   Post-op Pain Management:    Induction: Intravenous  PONV Risk Score and Plan: 1 and Dexamethasone and Ondansetron  Airway Management Planned: Oral ETT  Additional Equipment:   Intra-op Plan:   Post-operative Plan: Extubation in OR  Informed Consent: I have reviewed the patients History and Physical, chart, labs and discussed the procedure including the risks, benefits and alternatives for the proposed anesthesia with the patient or authorized representative who has indicated his/her understanding and acceptance.   Dental advisory given  Plan Discussed with: CRNA and Anesthesiologist  Anesthesia Plan Comments:         Anesthesia Quick Evaluation

## 2017-08-23 NOTE — Op Note (Signed)
OPERATIVE REPORT  PREOPERATIVE DIAGNOSIS:  Chronic cholecystitis   POSTOPERATIVE DIAGNOSIS: Chronic cholecystitis   PROCEDURE: Laparoscopic cholecystectomy with cholangiogram  ANESTHESIA: General  SURGEON: Renda RollsWilton Tailynn Armetta M.D.  INDICATIONS: He has a chronic history of nausea and some upper quadrant pain.  He had abnormally low gallbladder ejection fraction of 20%.  Surgery was recommended for definitive treatment.    With the patient on the operating table in the supine position under general endotracheal anesthesia the abdomen was prepared with ChloraPrep solution and draped in a sterile manner. A short incision was made in the inferior aspect of the umbilicus and carried down to the deep fascia which was grasped with a laryngeal hook. A Veress needle was inserted aspirated and irrigated with a saline solution. The peritoneal cavity was insufflated with carbon dioxide. The Veress needle was removed. The 10 mm cannula was inserted. The 10 mm 0 laparoscope was inserted to view the peritoneal cavity.  Another incision was made in the epigastrium slightly to the right of the midline to introduce an 11 mm cannula. 2 incisions were made in the lateral aspect of the right upper quadrant to introduce 2   5 mm cannulas.  Inspection revealed the smooth surface of the liver.  Brief survey revealed typical appearance of small and large bowel. The gallbladder was retracted towards the right shoulder.  The gallbladder neck was retracted inferiorly and laterally.  The porta hepatis was identified. The gallbladder was mobilized with incision of the visceral peritoneum. The cystic duct was dissected free from surrounding structures. The cystic artery was dissected free from surrounding structures. A critical view of safety was demonstrated  An Endo Clip was placed across the cystic duct adjacent to the gallbladder neck. An incision was made in the cystic duct to introduce a Reddick catheter. The cholangiogram was done  with injection of half-strength Conray 60 dye. This demonstrated the bile ducts and flow of dye into the duodenum. No retained stones were identified. The cholangiogram appeared normal. The Reddick catheter was removed. The cystic duct was doubly ligated with endoclips and divided. The cystic artery was controlled with double endoclips and divided.  One other small branch of the cystic artery was controlled with an Endo Clip and divided.  The gallbladder was dissected free from the liver with use of hook and cautery and blunt dissection. Bleeding was minimal and hemostasis was intact. The gallbladder was delivered up through the epigastric incision opened and suctioned.  The gallbladder was removed and did note some cholesterolosis on the mucosal surface.  The gallbladder was submitted in formalin for routine pathology.  The cannulas were removed allowing carbon dioxide to escape from the peritoneal cavity.  The fascial defect at the umbilicus was closed with a 0 Vicryl suture. The skin incisions were closed with interrupted 5-0 chromic subcutaneous suture benzoin and Steri-Strips. Gauze dressings were applied with paper tape.  The patient appeared to be in satisfactory condition and was prepared for transfer to the recovery room  Renda RollsWilton Samiel Peel M.D.

## 2017-08-23 NOTE — Anesthesia Post-op Follow-up Note (Signed)
Anesthesia QCDR form completed.        

## 2017-08-23 NOTE — Anesthesia Postprocedure Evaluation (Signed)
Anesthesia Post Note  Patient: Scarlett PrestoRichard T Prichett  Procedure(s) Performed: LAPAROSCOPIC CHOLECYSTECTOMY WITH INTRAOPERATIVE CHOLANGIOGRAM (N/A Abdomen)  Patient location during evaluation: PACU Anesthesia Type: General Level of consciousness: awake and alert and oriented Pain management: pain level controlled Vital Signs Assessment: post-procedure vital signs reviewed and stable Respiratory status: spontaneous breathing, nonlabored ventilation and respiratory function stable Cardiovascular status: blood pressure returned to baseline and stable Postop Assessment: no signs of nausea or vomiting Anesthetic complications: no     Last Vitals:  Vitals:   08/23/17 1422 08/23/17 1427  BP:    Pulse: 80 82  Resp: 13 (!) 22  Temp:    SpO2: 99% 97%    Last Pain:  Vitals:   08/23/17 1427  TempSrc:   PainSc: 5                  Marcee Jacobs

## 2017-08-23 NOTE — Anesthesia Procedure Notes (Signed)
Procedure Name: Intubation Date/Time: 08/23/2017 12:30 PM Performed by: Eben Burow, CRNA Pre-anesthesia Checklist: Patient identified, Emergency Drugs available, Suction available, Patient being monitored and Timeout performed Patient Re-evaluated:Patient Re-evaluated prior to induction Oxygen Delivery Method: Circle system utilized Preoxygenation: Pre-oxygenation with 100% oxygen Induction Type: IV induction Ventilation: Mask ventilation without difficulty Laryngoscope Size: Mac and 4 Grade View: Grade I Tube type: Oral Tube size: 7.5 mm Number of attempts: 1 Airway Equipment and Method: Stylet and LTA kit utilized Placement Confirmation: ETT inserted through vocal cords under direct vision,  positive ETCO2 and breath sounds checked- equal and bilateral Secured at: 23 cm Tube secured with: Tape Dental Injury: Teeth and Oropharynx as per pre-operative assessment

## 2017-08-23 NOTE — H&P (Signed)
  He reports no change in overall condition since the office exam.  He did have some epigastric discomfort yesterday.  He does continue to have problems with nausea.  Lab work reviewed  I discussed the plan for laparoscopic cholecystectomy.

## 2017-08-23 NOTE — Progress Notes (Signed)
Dr. Katrinka BlazingSmith into see and check

## 2017-08-23 NOTE — Transfer of Care (Signed)
Immediate Anesthesia Transfer of Care Note  Patient: Scarlett PrestoRichard T Gatlin  Procedure(s) Performed: LAPAROSCOPIC CHOLECYSTECTOMY WITH INTRAOPERATIVE CHOLANGIOGRAM (N/A Abdomen)  Patient Location: PACU  Anesthesia Type:General  Level of Consciousness: awake, alert  and oriented  Airway & Oxygen Therapy: Patient Spontanous Breathing and Patient connected to nasal cannula oxygen  Post-op Assessment: Report given to RN and Post -op Vital signs reviewed and stable  Post vital signs: Reviewed and stable  Last Vitals:  Vitals:   08/23/17 1019 08/23/17 1347  BP: 121/79 140/76  Pulse: 70 90  Resp: 15 10  Temp: 37 C (!) 36.3 C  SpO2: 100% 100%    Last Pain:  Vitals:   08/23/17 1019  TempSrc: Oral         Complications: No apparent anesthesia complications

## 2017-08-26 LAB — SURGICAL PATHOLOGY

## 2017-08-28 ENCOUNTER — Other Ambulatory Visit: Payer: Self-pay | Admitting: Emergency Medicine

## 2017-08-28 DIAGNOSIS — J40 Bronchitis, not specified as acute or chronic: Secondary | ICD-10-CM

## 2017-08-28 MED ORDER — AMOXICILLIN-POT CLAVULANATE 875-125 MG PO TABS: 1.0000 | ORAL_TABLET | Freq: Two times a day (BID) | ORAL | 0 refills | Status: DC

## 2017-08-28 MED ORDER — HYDROCOD POLST-CPM POLST ER 10-8 MG/5ML PO SUER: 5.0000 mL | Freq: Two times a day (BID) | ORAL | 0 refills | Status: DC | PRN

## 2017-08-31 ENCOUNTER — Ambulatory Visit: Payer: BLUE CROSS/BLUE SHIELD | Attending: Otolaryngology

## 2017-08-31 DIAGNOSIS — R5383 Other fatigue: Secondary | ICD-10-CM | POA: Diagnosis not present

## 2017-08-31 DIAGNOSIS — R0683 Snoring: Secondary | ICD-10-CM | POA: Diagnosis not present

## 2017-08-31 DIAGNOSIS — G4733 Obstructive sleep apnea (adult) (pediatric): Secondary | ICD-10-CM | POA: Insufficient documentation

## 2017-08-31 DIAGNOSIS — F5101 Primary insomnia: Secondary | ICD-10-CM | POA: Insufficient documentation

## 2017-09-04 ENCOUNTER — Other Ambulatory Visit: Payer: Self-pay

## 2017-09-04 ENCOUNTER — Ambulatory Visit
Admission: RE | Admit: 2017-09-04 | Discharge: 2017-09-04 | Disposition: A | Discharge status: Home / Self Care | Payer: BLUE CROSS/BLUE SHIELD | Source: Ambulatory Visit | Attending: Family Medicine | Admitting: Family Medicine

## 2017-09-04 DIAGNOSIS — J42 Unspecified chronic bronchitis: Secondary | ICD-10-CM

## 2017-09-04 DIAGNOSIS — R059 Cough, unspecified: Secondary | ICD-10-CM

## 2017-09-04 DIAGNOSIS — R05 Cough: Secondary | ICD-10-CM | POA: Insufficient documentation

## 2017-09-04 MED ORDER — AZITHROMYCIN 250 MG PO TABS: ORAL_TABLET | ORAL | 0 refills | Status: DC

## 2017-09-04 MED ORDER — PREDNISONE 10 MG (21) PO TBPK: ORAL_TABLET | ORAL | 0 refills | Status: DC

## 2017-09-04 NOTE — Progress Notes (Signed)
Orders only per Dr. GReece Agar

## 2017-09-06 ENCOUNTER — Other Ambulatory Visit: Payer: Self-pay

## 2017-09-06 DIAGNOSIS — N401 Enlarged prostate with lower urinary tract symptoms: Secondary | ICD-10-CM | POA: Diagnosis not present

## 2017-09-06 DIAGNOSIS — R3914 Feeling of incomplete bladder emptying: Secondary | ICD-10-CM | POA: Diagnosis not present

## 2017-09-06 DIAGNOSIS — R300 Dysuria: Secondary | ICD-10-CM | POA: Diagnosis not present

## 2017-09-06 DIAGNOSIS — G4730 Sleep apnea, unspecified: Secondary | ICD-10-CM

## 2017-09-07 DIAGNOSIS — D229 Melanocytic nevi, unspecified: Secondary | ICD-10-CM | POA: Diagnosis not present

## 2017-09-07 DIAGNOSIS — L821 Other seborrheic keratosis: Secondary | ICD-10-CM | POA: Diagnosis not present

## 2017-09-07 DIAGNOSIS — Z85828 Personal history of other malignant neoplasm of skin: Secondary | ICD-10-CM | POA: Diagnosis not present

## 2017-09-07 DIAGNOSIS — Z1283 Encounter for screening for malignant neoplasm of skin: Secondary | ICD-10-CM | POA: Diagnosis not present

## 2017-09-07 DIAGNOSIS — L570 Actinic keratosis: Secondary | ICD-10-CM | POA: Diagnosis not present

## 2017-09-12 ENCOUNTER — Ambulatory Visit: Payer: BLUE CROSS/BLUE SHIELD | Attending: Otolaryngology

## 2017-09-12 DIAGNOSIS — F5101 Primary insomnia: Secondary | ICD-10-CM | POA: Insufficient documentation

## 2017-09-12 DIAGNOSIS — G4733 Obstructive sleep apnea (adult) (pediatric): Secondary | ICD-10-CM | POA: Insufficient documentation

## 2017-09-13 DIAGNOSIS — E049 Nontoxic goiter, unspecified: Secondary | ICD-10-CM | POA: Diagnosis not present

## 2017-09-18 ENCOUNTER — Other Ambulatory Visit: Payer: Self-pay | Admitting: Family Medicine

## 2017-09-18 DIAGNOSIS — K219 Gastro-esophageal reflux disease without esophagitis: Secondary | ICD-10-CM

## 2017-09-18 DIAGNOSIS — R110 Nausea: Secondary | ICD-10-CM

## 2017-09-19 NOTE — Progress Notes (Signed)
Depo medrol only

## 2017-09-20 DIAGNOSIS — E049 Nontoxic goiter, unspecified: Secondary | ICD-10-CM | POA: Diagnosis not present

## 2017-09-20 DIAGNOSIS — R1310 Dysphagia, unspecified: Secondary | ICD-10-CM | POA: Diagnosis not present

## 2017-09-20 DIAGNOSIS — Z4789 Encounter for other orthopedic aftercare: Secondary | ICD-10-CM | POA: Diagnosis not present

## 2017-09-20 DIAGNOSIS — H0520 Unspecified exophthalmos: Secondary | ICD-10-CM | POA: Diagnosis not present

## 2017-09-20 DIAGNOSIS — S46012D Strain of muscle(s) and tendon(s) of the rotator cuff of left shoulder, subsequent encounter: Secondary | ICD-10-CM | POA: Diagnosis not present

## 2017-09-21 ENCOUNTER — Telehealth: Payer: Self-pay | Admitting: Family Medicine

## 2017-09-21 NOTE — Telephone Encounter (Signed)
Spoke with patient and advised him that we faxed over the CPAP order authorization to [facility] on 09/19/17 and from this point is up to them to get it set up. I provided patient with their number so he can call and hopefully get this done and set up before the end of the year. Patient understood and will check with them-Anastasiya V Hopkins, RMA

## 2017-09-21 NOTE — Telephone Encounter (Signed)
Pt is calling inquire about CPAP supplies.  Pt states he would like to have the supplies ordered by the first of the year.  Please advise.  NW#[phone]/VHCB#[phone]/MW

## 2017-09-27 DIAGNOSIS — G4733 Obstructive sleep apnea (adult) (pediatric): Secondary | ICD-10-CM | POA: Diagnosis not present

## 2017-09-27 DIAGNOSIS — R0683 Snoring: Secondary | ICD-10-CM | POA: Diagnosis not present

## 2017-09-29 ENCOUNTER — Other Ambulatory Visit: Payer: Self-pay | Admitting: Family Medicine

## 2017-09-29 DIAGNOSIS — G4700 Insomnia, unspecified: Secondary | ICD-10-CM

## 2017-10-06 ENCOUNTER — Other Ambulatory Visit: Payer: Self-pay | Admitting: Family Medicine

## 2017-10-06 MED ORDER — OMEPRAZOLE 40 MG PO CPDR: 40.0000 mg | DELAYED_RELEASE_CAPSULE | Freq: Every day | ORAL | 3 refills | Status: DC

## 2017-10-06 NOTE — Telephone Encounter (Signed)
Med sent in.

## 2017-10-06 NOTE — Telephone Encounter (Signed)
[facility] faxed a refill request for a 90-days supply for the following medication. Thanks CC ° °omeprazole (PRILOSEC) 40 MG capsule  ° °

## 2017-10-13 ENCOUNTER — Other Ambulatory Visit: Payer: Self-pay | Admitting: Family Medicine

## 2017-10-13 DIAGNOSIS — R110 Nausea: Secondary | ICD-10-CM

## 2017-10-13 DIAGNOSIS — K219 Gastro-esophageal reflux disease without esophagitis: Secondary | ICD-10-CM

## 2017-10-13 NOTE — Telephone Encounter (Signed)
[facility] faxed refill request for the following medications:  sucralfate (CARAFATE) 1 g tablet  90 day supply  Last Rx: 09/18/17 LOV: 08/01/17 Please advise. Thanks TNP

## 2017-10-16 MED ORDER — SUCRALFATE 1 G PO TABS: 1.0000 g | ORAL_TABLET | Freq: Four times a day (QID) | ORAL | 3 refills | Status: DC

## 2017-10-17 DIAGNOSIS — G43119 Migraine with aura, intractable, without status migrainosus: Secondary | ICD-10-CM | POA: Diagnosis not present

## 2017-10-17 DIAGNOSIS — R419 Unspecified symptoms and signs involving cognitive functions and awareness: Secondary | ICD-10-CM | POA: Diagnosis not present

## 2017-10-17 DIAGNOSIS — G2581 Restless legs syndrome: Secondary | ICD-10-CM | POA: Diagnosis not present

## 2017-10-17 DIAGNOSIS — R259 Unspecified abnormal involuntary movements: Secondary | ICD-10-CM | POA: Diagnosis not present

## 2017-10-20 DIAGNOSIS — Z23 Encounter for immunization: Secondary | ICD-10-CM | POA: Diagnosis not present

## 2017-10-28 DIAGNOSIS — R0683 Snoring: Secondary | ICD-10-CM | POA: Diagnosis not present

## 2017-10-28 DIAGNOSIS — G4733 Obstructive sleep apnea (adult) (pediatric): Secondary | ICD-10-CM | POA: Diagnosis not present

## 2017-11-28 DIAGNOSIS — R0683 Snoring: Secondary | ICD-10-CM | POA: Diagnosis not present

## 2017-11-28 DIAGNOSIS — G4733 Obstructive sleep apnea (adult) (pediatric): Secondary | ICD-10-CM | POA: Diagnosis not present

## 2017-12-01 ENCOUNTER — Telehealth: Payer: Self-pay | Admitting: Emergency Medicine

## 2017-12-01 NOTE — Telephone Encounter (Signed)
Tried to call pt. Mailbox was full. [facility] faxed over stating that per pt insurance he needed an appt for her CPAP compliance. He needs to schedule an appt for this.   (The OV note must document that the pt is using the CPAP and benefiting from the machine.)

## 2017-12-06 NOTE — Telephone Encounter (Signed)
LMTCB

## 2017-12-07 NOTE — Telephone Encounter (Signed)
appt scheduled for 12/13/17 @ 10:20

## 2017-12-13 ENCOUNTER — Other Ambulatory Visit: Payer: Self-pay

## 2017-12-13 ENCOUNTER — Ambulatory Visit (INDEPENDENT_AMBULATORY_CARE_PROVIDER_SITE_OTHER): Payer: BLUE CROSS/BLUE SHIELD | Admitting: Family Medicine

## 2017-12-13 VITALS — BP 110/70 | HR 74 | Temp 97.9°F | Resp 16

## 2017-12-13 DIAGNOSIS — R5383 Other fatigue: Secondary | ICD-10-CM | POA: Diagnosis not present

## 2017-12-13 DIAGNOSIS — R109 Unspecified abdominal pain: Secondary | ICD-10-CM | POA: Diagnosis not present

## 2017-12-13 DIAGNOSIS — R197 Diarrhea, unspecified: Secondary | ICD-10-CM | POA: Diagnosis not present

## 2017-12-13 DIAGNOSIS — G4733 Obstructive sleep apnea (adult) (pediatric): Secondary | ICD-10-CM | POA: Diagnosis not present

## 2017-12-13 MED ORDER — CHOLESTYRAMINE 4 GM/DOSE PO POWD
2.0000 g | Freq: Three times a day (TID) | ORAL | 12 refills | Status: DC
Start: 2017-12-13 — End: 2018-12-03

## 2017-12-13 NOTE — Progress Notes (Signed)
Kurt PrestoRichard T Lape  MRN: [id number] DOB: 03/15/1966  Subjective:  HPI   The patient is a 52 year old male who presents for follow up of compliance on CPAP.  Patient has been using his CPAP since January.  He states that during his that time he had a lot of congestion and this made using the CPAP difficult.  Once the congestion cleared he was able to use the machine better.  However he has had a lot of difficulty staying asleep on it.  He states that he wakes up several times during the night.  He feels it may be because the mask moves or the hose gets in his way and the noise will then wake him up.  He is currently using a facial piece that only fits over the nose.  He thinks he may need to try using one that goes over the mouth. He says he has required a lot more sleep the past 2 weeks. Patient Active Problem List   Diagnosis Date Noted  . Insomnia 04/04/2016  . Parkinson's disease (HCC) 08/20/2014  . Benign fibroma of prostate 07/31/2014  . Clinical depression 07/31/2014  . Acid reflux 07/31/2014  . Calculus of kidney 07/31/2014  . Headache, migraine 07/31/2014  . Neurocardiogenic syncope 07/31/2014  . Gastro-esophageal reflux disease without esophagitis 05/14/2014  . Bleeding per rectum 05/14/2014  . Beat, premature ventricular 04/08/2014  . Atrial fibrillation and flutter (HCC) 04/08/2014  . BP (high blood pressure) 03/25/2014  . Awareness of heartbeats 03/25/2014  . NS (nuclear sclerosis) 02/05/2014  . Atelectasis 08/04/1983    Past Medical History:  Diagnosis Date  . Anxiety   . Arthritis    hand   . BPH (benign prostatic hyperplasia)   . Collapsed lung    left lung- 1984   . CTS (carpal tunnel syndrome)   . Depression   . Dysrhythmia    pvc's per pt on no medication   . GERD (gastroesophageal reflux disease)   . History of kidney stones   . Migraines   . Parkinson's disease (HCC)   . REM behavioral disorder   . Restless legs syndrome (RLS)   . Syncopal episodes     08/2014 - [facility], abnormal EEG- - note 08/20/14 in Care Everywhere )    . Thyroid disease   . Tremor   . Vasovagal episode     Social History   Socioeconomic History  . Marital status: Married    Spouse name: Not on file  . Number of children: Not on file  . Years of education: Not on file  . Highest education level: Not on file  Social Needs  . Financial resource strain: Not on file  . Food insecurity - worry: Not on file  . Food insecurity - inability: Not on file  . Transportation needs - medical: Not on file  . Transportation needs - non-medical: Not on file  Occupational History  . Not on file  Tobacco Use  . Smoking status: Never Smoker  . Smokeless tobacco: Never Used  Substance and Sexual Activity  . Alcohol use: Yes    Comment: rare- 2 beers per month   . Drug use: No  . Sexual activity: Not on file  Other Topics Concern  . Not on file  Social History Narrative  . Not on file    Outpatient Encounter Medications as of 12/13/2017  Medication Sig  . Ascorbic Acid (VITAMIN C) 1000 MG tablet Take by  mouth.  . cholecalciferol (VITAMIN D) 1000 units tablet Take 5,000 Units by mouth daily. Take 5,000 units daily  . Cyanocobalamin (RA VITAMIN B-12 TR) 1000 MCG TBCR Take by mouth.  . fluticasone (FLONASE) 50 MCG/ACT nasal spray Instill 2 sprays in each nostril once daily.  Marland Kitchen gabapentin (NEURONTIN) 300 MG capsule Take 600 mg by mouth at bedtime.   Marland Kitchen omeprazole (PRILOSEC) 40 MG capsule Take 1 capsule (40 mg total) by mouth daily.  Marland Kitchen oxymetazoline (AFRIN) 0.05 % nasal spray Place 2 sprays into both nostrils 2 (two) times daily as needed for congestion.  . promethazine (PHENERGAN) 25 MG tablet Take 1 tablet (25 mg total) by mouth every 6 (six) hours as needed for nausea or vomiting.  . sertraline (ZOLOFT) 50 MG tablet Take 1 tablet (50 mg total) by mouth daily.  Marland Kitchen triamcinolone cream (KENALOG) 0.1 % Apply 1 application topically 2 (two) times  daily.  . valACYclovir (VALTREX) 1000 MG tablet Take 1 tablet (1,000 mg total) 3 (three) times daily by mouth.  . zolpidem (AMBIEN) 10 MG tablet TAKE 1 TABLET BY MOUTH AT BEDTIME AS NEEDED FOR SLEEP  . [DISCONTINUED] amoxicillin-clavulanate (AUGMENTIN) 875-125 MG tablet Take 1 tablet by mouth 2 (two) times daily.  . [DISCONTINUED] azithromycin (ZITHROMAX) 250 MG tablet Take 2 tablets by mouth today, then 1 tablet by mouth daily thereafter.  . [DISCONTINUED] chlorpheniramine-HYDROcodone (TUSSIONEX PENNKINETIC ER) 10-8 MG/5ML SUER Take 5 mLs by mouth every 12 (twelve) hours as needed for cough.  . [DISCONTINUED] HYDROcodone-acetaminophen (NORCO) 5-325 MG tablet Take 1-2 tablets by mouth every 6 (six) hours as needed for moderate pain.  . [DISCONTINUED] metoCLOPramide (REGLAN) 5 MG tablet Take 5 mg 4 (four) times daily as needed by mouth.  . [DISCONTINUED] predniSONE (STERAPRED UNI-PAK 21 TAB) 10 MG (21) TBPK tablet Take dose pack as directed  . [DISCONTINUED] ranitidine (ZANTAC) 150 MG tablet Take 1 tablet (150 mg total) by mouth 2 (two) times daily.  . [DISCONTINUED] sucralfate (CARAFATE) 1 g tablet Take 1 tablet (1 g total) by mouth 4 (four) times daily.   No facility-administered encounter medications on file as of 12/13/2017.     Allergies  Allergen Reactions  . Codeine Nausea And Vomiting and Other (See Comments)    Muscle tightness     Review of Systems  Eyes: Negative.   Respiratory: Negative for cough, shortness of breath and wheezing.   Cardiovascular: Positive for chest pain (slight tightness one evening, he got up and read and then was ok.). Negative for palpitations, orthopnea and leg swelling.  Gastrointestinal: Negative.   Neurological: Negative for dizziness and headaches.  Endo/Heme/Allergies: Negative.   Psychiatric/Behavioral: Negative.     Objective:  BP 110/70 (BP Location: Right Arm, Patient Position: Sitting, Cuff Size: Normal)   Pulse 74   Temp 97.9 F (36.6 C)  (Oral)   Resp 16   Physical Exam  Constitutional: He is oriented to person, place, and time and well-developed, well-nourished, and in no distress.  HENT:  Head: Normocephalic and atraumatic.  Right Ear: External ear normal.  Left Ear: External ear normal.  Nose: Nose normal.  Cardiovascular: Normal rate, regular rhythm and normal heart sounds.  Pulmonary/Chest: Effort normal and breath sounds normal.  Abdominal: Soft.  Neurological: He is alert and oriented to person, place, and time.  Skin: Skin is warm and dry.  Psychiatric: Memory, affect and judgment normal.    Assessment and Plan :  1. OSA (obstructive sleep apnea)  - CBC with Differential/Platelet  2. Other fatigue  - CBC with Differential/Platelet - TSH - Comprehensive metabolic panel - Sedimentation rate  3. Diarrhea, unspecified type Loose stools with urgency--pt s/p cholecystectomy. - cholestyramine (QUESTRAN) 4 GM/DOSE powder; Take 0.5 packets (2 g total) by mouth 3 (three) times daily with meals.  Dispense: 378 g; Refill: 12 - CBC with Differential/Platelet - Comprehensive metabolic panel  4. Abdominal pain, unspecified abdominal location  - CBC with Differential/Platelet - Comprehensive metabolic panel 5.Excessive Somnolence  I have done the exam and reviewed the chart and it is accurate to the best of my knowledge. Dentist has been used and  any errors in dictation or transcription are unintentional. Julieanne Manson M.D. [facility]

## 2017-12-14 LAB — COMPREHENSIVE METABOLIC PANEL
A/G RATIO: 1.7 (ref 1.2–2.2)
ALBUMIN: 4.5 g/dL (ref 3.5–5.5)
ALK PHOS: 112 IU/L (ref 39–117)
ALT: 29 IU/L (ref 0–44)
AST: 25 IU/L (ref 0–40)
BILIRUBIN TOTAL: 0.8 mg/dL (ref 0.0–1.2)
BUN / CREAT RATIO: 19 (ref 9–20)
BUN: 18 mg/dL (ref 6–24)
CHLORIDE: 103 mmol/L (ref 96–106)
CO2: 23 mmol/L (ref 20–29)
Calcium: 9.3 mg/dL (ref 8.7–10.2)
Creatinine, Ser: 0.96 mg/dL (ref 0.76–1.27)
GFR calc Af Amer: 105 mL/min/{1.73_m2} (ref >59)
GFR calc non Af Amer: 91 mL/min/{1.73_m2} (ref >59)
GLOBULIN, TOTAL: 2.6 g/dL (ref 1.5–4.5)
Glucose: 91 mg/dL (ref 65–99)
POTASSIUM: 4.7 mmol/L (ref 3.5–5.2)
SODIUM: 142 mmol/L (ref 134–144)
Total Protein: 7.1 g/dL (ref 6.0–8.5)

## 2017-12-14 LAB — CBC WITH DIFFERENTIAL/PLATELET
BASOS: 1 %
Basophils Absolute: 0 10*3/uL (ref 0.0–0.2)
EOS (ABSOLUTE): 0.3 10*3/uL (ref 0.0–0.4)
EOS: 6 %
HEMATOCRIT: 42.8 % (ref 37.5–51.0)
Hemoglobin: 15 g/dL (ref 13.0–17.7)
Immature Grans (Abs): 0 10*3/uL (ref 0.0–0.1)
Immature Granulocytes: 0 %
LYMPHS ABS: 1.7 10*3/uL (ref 0.7–3.1)
Lymphs: 29 %
MCH: 32 pg (ref 26.6–33.0)
MCHC: 35 g/dL (ref 31.5–35.7)
MCV: 91 fL (ref 79–97)
MONOS ABS: 0.6 10*3/uL (ref 0.1–0.9)
Monocytes: 11 %
NEUTROS PCT: 53 %
Neutrophils Absolute: 3.1 10*3/uL (ref 1.4–7.0)
PLATELETS: 271 10*3/uL (ref 150–379)
RBC: 4.69 x10E6/uL (ref 4.14–5.80)
RDW: 13.7 % (ref 12.3–15.4)
WBC: 5.8 10*3/uL (ref 3.4–10.8)

## 2017-12-14 LAB — SEDIMENTATION RATE: SED RATE: 2 mm/h (ref 0–30)

## 2017-12-14 LAB — TSH: TSH: 0.771 u[IU]/mL (ref 0.450–4.500)

## 2017-12-18 IMAGING — NM NM HEPATO W/GB/PHARM/[PERSON_NAME]
2 series · 12 of 12 positions shown · non-contrast
Comparison: 06/20/2017 right upper quadrant ultrasound.

CLINICAL DATA: 51-year-old male with right upper quadrant pain and
abdominal distention with nausea. Subsequent encounter.

EXAM:
NUCLEAR MEDICINE HEPATOBILIARY IMAGING WITH GALLBLADDER EF
TECHNIQUE: Sequential images of the abdomen were obtained [DATE] minutes
following intravenous administration of radiopharmaceutical. After
oral ingestion of Ensure, gallbladder ejection fraction was
determined. At 60 min, normal ejection fraction is greater than 33%.
RADIOPHARMACEUTICALS:  5.46 mCi 3c-DDm  Choletec IV

[Series 1000: hepatobiliary scan · 9.59mm/px · 6 of 60 frames shown]
[frame 6/60]
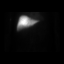
[frame 16/60]
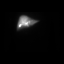
[frame 26/60]
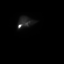
[frame 36/60]
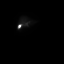
[frame 46/60]
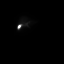
[frame 56/60]
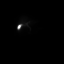

[Series 1000: gallbladder ef · 4.80mm/px · 6 of 120 frames shown]
[frame 11/120]
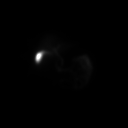
[frame 31/120]
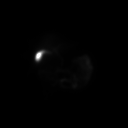
[frame 51/120]
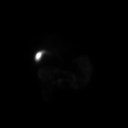
[frame 71/120]
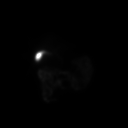
[frame 91/120]
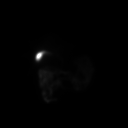
[frame 111/120]
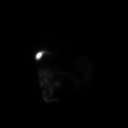

[12 of 12 positions shown; findings below may reference images not displayed]

FINDINGS: Prompt uptake and biliary excretion of activity by the liver is
seen. Gallbladder activity is visualized, consistent with patency of
cystic duct. Biliary activity passes into small bowel, consistent
with patent common bile duct.

Calculated gallbladder ejection fraction is 20%. (Normal gallbladder
ejection fraction with Ensure is greater than 33%.)

Patient experienced no pain after drinking Ensure.
IMPRESSION: Decreased gallbladder ejection fraction of 20% (Normal gallbladder
ejection fraction with Ensure is greater than 33%.).

## 2017-12-19 ENCOUNTER — Other Ambulatory Visit: Payer: Self-pay | Admitting: Family Medicine

## 2017-12-19 ENCOUNTER — Telehealth: Payer: Self-pay

## 2017-12-19 DIAGNOSIS — R110 Nausea: Secondary | ICD-10-CM

## 2017-12-19 DIAGNOSIS — R142 Eructation: Secondary | ICD-10-CM

## 2017-12-19 NOTE — Telephone Encounter (Signed)
LMTCB  Thanks,  -Joseline 

## 2017-12-19 NOTE — Telephone Encounter (Signed)
-----   Message from Maple Hudsonichard L Gilbert Jr., MD sent at 12/18/2017  5:03 PM EDT ----- Labs OK

## 2017-12-20 NOTE — Telephone Encounter (Signed)
Pt advised.

## 2017-12-26 DIAGNOSIS — R0683 Snoring: Secondary | ICD-10-CM | POA: Diagnosis not present

## 2017-12-26 DIAGNOSIS — G4733 Obstructive sleep apnea (adult) (pediatric): Secondary | ICD-10-CM | POA: Diagnosis not present

## 2018-01-03 ENCOUNTER — Ambulatory Visit: Payer: Self-pay | Admitting: Family Medicine

## 2018-01-11 DIAGNOSIS — J029 Acute pharyngitis, unspecified: Secondary | ICD-10-CM | POA: Diagnosis not present

## 2018-01-11 DIAGNOSIS — R197 Diarrhea, unspecified: Secondary | ICD-10-CM | POA: Diagnosis not present

## 2018-01-11 DIAGNOSIS — J020 Streptococcal pharyngitis: Secondary | ICD-10-CM | POA: Diagnosis not present

## 2018-01-18 DIAGNOSIS — R0683 Snoring: Secondary | ICD-10-CM | POA: Diagnosis not present

## 2018-01-18 DIAGNOSIS — G4733 Obstructive sleep apnea (adult) (pediatric): Secondary | ICD-10-CM | POA: Diagnosis not present

## 2018-01-22 DIAGNOSIS — I10 Essential (primary) hypertension: Secondary | ICD-10-CM | POA: Diagnosis not present

## 2018-01-22 DIAGNOSIS — R0602 Shortness of breath: Secondary | ICD-10-CM | POA: Diagnosis not present

## 2018-01-22 DIAGNOSIS — I493 Ventricular premature depolarization: Secondary | ICD-10-CM | POA: Diagnosis not present

## 2018-01-22 DIAGNOSIS — R55 Syncope and collapse: Secondary | ICD-10-CM | POA: Diagnosis not present

## 2018-02-28 DIAGNOSIS — M25511 Pain in right shoulder: Secondary | ICD-10-CM | POA: Diagnosis not present

## 2018-02-28 DIAGNOSIS — M542 Cervicalgia: Secondary | ICD-10-CM | POA: Diagnosis not present

## 2018-02-28 DIAGNOSIS — M5418 Radiculopathy, sacral and sacrococcygeal region: Secondary | ICD-10-CM | POA: Diagnosis not present

## 2018-02-28 DIAGNOSIS — M5413 Radiculopathy, cervicothoracic region: Secondary | ICD-10-CM | POA: Diagnosis not present

## 2018-03-19 DIAGNOSIS — R259 Unspecified abnormal involuntary movements: Secondary | ICD-10-CM | POA: Diagnosis not present

## 2018-03-19 DIAGNOSIS — G43119 Migraine with aura, intractable, without status migrainosus: Secondary | ICD-10-CM | POA: Diagnosis not present

## 2018-03-19 DIAGNOSIS — G2581 Restless legs syndrome: Secondary | ICD-10-CM | POA: Diagnosis not present

## 2018-03-19 DIAGNOSIS — R419 Unspecified symptoms and signs involving cognitive functions and awareness: Secondary | ICD-10-CM | POA: Diagnosis not present

## 2018-04-01 ENCOUNTER — Other Ambulatory Visit: Payer: Self-pay | Admitting: Family Medicine

## 2018-04-01 DIAGNOSIS — G4700 Insomnia, unspecified: Secondary | ICD-10-CM

## 2018-04-20 ENCOUNTER — Other Ambulatory Visit: Payer: Self-pay | Admitting: Family Medicine

## 2018-04-20 MED ORDER — CHOLESTYRAMINE LIGHT 4 G PO PACK: 4.0000 g | PACK | Freq: Two times a day (BID) | ORAL | 11 refills | Status: DC

## 2018-04-20 NOTE — Progress Notes (Signed)
Rx for diarrhea s/p cholecystectomy.

## 2018-04-30 DIAGNOSIS — R208 Other disturbances of skin sensation: Secondary | ICD-10-CM | POA: Diagnosis not present

## 2018-05-03 ENCOUNTER — Encounter: Payer: Self-pay | Admitting: Family Medicine

## 2018-05-03 ENCOUNTER — Ambulatory Visit (INDEPENDENT_AMBULATORY_CARE_PROVIDER_SITE_OTHER): Payer: BLUE CROSS/BLUE SHIELD | Admitting: Family Medicine

## 2018-05-03 VITALS — BP 126/62 | HR 58 | Temp 98.0°F | Resp 16 | Wt 231.0 lb

## 2018-05-03 DIAGNOSIS — R002 Palpitations: Secondary | ICD-10-CM

## 2018-05-03 DIAGNOSIS — G629 Polyneuropathy, unspecified: Secondary | ICD-10-CM

## 2018-05-03 LAB — POCT ABI - SCREENING FOR PILOT NO CHARGE

## 2018-05-03 NOTE — Progress Notes (Signed)
Patient: Kurt PrestoRichard T Aiello Male    DOB: 1965-11-02   52 y.o.   MRN: [id number] Visit Date: 05/03/2018  Today's Provider: Megan Mansichard Darrious Youman Jr, MD   Chief Complaint  Patient presents with  . discoloration of toes   Subjective:    HPI Patient comes in today c/o blue toes in both feet. Patient reports that he has had symptoms since Monday (about 3 days). He noticed it when he got out of the shower. He does have a numbness and tingling sensation, but he denies any pain.     Allergies  Allergen Reactions  . Codeine Nausea And Vomiting and Other (See Comments)    Muscle tightness      Current Outpatient Medications:  .  Ascorbic Acid (VITAMIN C) 1000 MG tablet, Take by mouth., Disp: , Rfl:  .  cholestyramine (QUESTRAN) 4 GM/DOSE powder, Take 0.5 packets (2 g total) by mouth 3 (three) times daily with meals., Disp: 378 g, Rfl: 12 .  Cyanocobalamin (RA VITAMIN B-12 TR) 1000 MCG TBCR, Take by mouth., Disp: , Rfl:  .  fluticasone (FLONASE) 50 MCG/ACT nasal spray, Instill 2 sprays in each nostril once daily., Disp: 16 g, Rfl: 11 .  gabapentin (NEURONTIN) 300 MG capsule, Take 600 mg by mouth at bedtime. , Disp: , Rfl:  .  omeprazole (PRILOSEC) 40 MG capsule, Take 1 capsule (40 mg total) by mouth daily., Disp: 90 capsule, Rfl: 3 .  promethazine (PHENERGAN) 25 MG tablet, Take 1 tablet (25 mg total) by mouth every 6 (six) hours as needed for nausea or vomiting., Disp: 60 tablet, Rfl: 1 .  sertraline (ZOLOFT) 50 MG tablet, Take 1 tablet (50 mg total) by mouth daily., Disp: 90 tablet, Rfl: 3 .  triamcinolone cream (KENALOG) 0.1 %, Apply 1 application topically 2 (two) times daily., Disp: 30 g, Rfl: 0 .  valACYclovir (VALTREX) 1000 MG tablet, Take 1 tablet (1,000 mg total) 3 (three) times daily by mouth., Disp: 21 tablet, Rfl: 0 .  zolpidem (AMBIEN) 10 MG tablet, TAKE 1 TABLET BY MOUTH AT BEDTIME IF NEEDED FOR SLEEP, Disp: 15 tablet, Rfl: 5 .  cholecalciferol (VITAMIN D) 1000 units tablet, Take  5,000 Units by mouth daily. Take 5,000 units daily, Disp: , Rfl:  .  cholestyramine light (PREVALITE) 4 g packet, Take 1 packet (4 g total) by mouth 2 (two) times daily., Disp: 60 packet, Rfl: 11 .  oxymetazoline (AFRIN) 0.05 % nasal spray, Place 2 sprays into both nostrils 2 (two) times daily as needed for congestion., Disp: , Rfl:  .  ranitidine (ZANTAC) 150 MG tablet, TAKE 1 TABLET BY MOUTH TWICE A DAY (Patient not taking: Reported on 05/03/2018), Disp: 60 tablet, Rfl: 5  Review of Systems  Constitutional: Negative for activity change, appetite change, chills, diaphoresis, fatigue, fever and unexpected weight change.  Eyes: Negative.   Respiratory: Negative for cough and shortness of breath.   Cardiovascular: Negative for leg swelling.  Gastrointestinal: Negative.   Endocrine: Negative.   Musculoskeletal: Negative for arthralgias, joint swelling and myalgias.  Allergic/Immunologic: Negative.   Neurological: Positive for numbness. Negative for dizziness, tremors, weakness and headaches.  Psychiatric/Behavioral: Negative.     Social History   Tobacco Use  . Smoking status: Never Smoker  . Smokeless tobacco: Never Used  Substance Use Topics  . Alcohol use: Yes    Comment: rare- 2 beers per month    Objective:   BP 126/62 (BP Location: Right Arm, Patient Position: Sitting, Cuff  Size: Large)   Pulse (!) 58   Temp 98 F (36.7 C)   Resp 16   Wt 231 lb (104.8 kg)   SpO2 98%   BMI 29.66 kg/m  Vitals:   05/03/18 1031  BP: 126/62  Pulse: (!) 58  Resp: 16  Temp: 98 F (36.7 C)  SpO2: 98%  Weight: 231 lb (104.8 kg)     Physical Exam  Constitutional: He is oriented to person, place, and time. He appears well-developed and well-nourished.  HENT:  Head: Normocephalic and atraumatic.  Right Ear: External ear normal.  Left Ear: External ear normal.  Nose: Nose normal.  Eyes: Conjunctivae are normal. No scleral icterus.  Neck: No thyromegaly present.  Cardiovascular: Normal  rate, regular rhythm and normal heart sounds.  Bruising of proximal 2-4th toes of left foot.  Pulmonary/Chest: Effort normal and breath sounds normal.  Abdominal: Soft.  Musculoskeletal: He exhibits no edema.  Neurological: He is alert and oriented to person, place, and time.  Skin: Skin is warm and dry.  Psychiatric: He has a normal mood and affect. His behavior is normal. Judgment and thought content normal.        Assessment & Plan:     1. Palpitation  - EKG 12-Lead 2.Bruising of left 2-4th toes ABI normal/pulses ok. Consider vascular referral. - Hemoglobin A1c  3 Neuropathy  - Vitamin B12 - TSH - POCT ABI Screening Pilot No Charge       I have done the exam and reviewed the above chart and it is accurate to the best of my knowledge. Dentist has been used in this note in any air is in the dictation or transcription are unintentional.  Megan Mans, MD  [facility]

## 2018-05-04 LAB — HEMOGLOBIN A1C
Est. average glucose Bld gHb Est-mCnc: 108 mg/dL
Hgb A1c MFr Bld: 5.4 % (ref 4.8–5.6)

## 2018-05-04 LAB — VITAMIN B12: Vitamin B-12: 1285 pg/mL — ABNORMAL HIGH (ref 232–1245)

## 2018-05-04 LAB — TSH: TSH: 1.39 u[IU]/mL (ref 0.450–4.500)

## 2018-05-14 ENCOUNTER — Other Ambulatory Visit: Payer: Self-pay | Admitting: Family Medicine

## 2018-05-21 DIAGNOSIS — J0100 Acute maxillary sinusitis, unspecified: Secondary | ICD-10-CM | POA: Diagnosis not present

## 2018-05-30 DIAGNOSIS — R3914 Feeling of incomplete bladder emptying: Secondary | ICD-10-CM | POA: Diagnosis not present

## 2018-05-30 DIAGNOSIS — N401 Enlarged prostate with lower urinary tract symptoms: Secondary | ICD-10-CM | POA: Diagnosis not present

## 2018-05-30 DIAGNOSIS — R972 Elevated prostate specific antigen [PSA]: Secondary | ICD-10-CM | POA: Diagnosis not present

## 2018-06-01 ENCOUNTER — Other Ambulatory Visit: Payer: Self-pay | Admitting: Family Medicine

## 2018-06-01 NOTE — Telephone Encounter (Signed)
Pharmacy requesting refills. Thanks!  

## 2018-06-05 DIAGNOSIS — J0100 Acute maxillary sinusitis, unspecified: Secondary | ICD-10-CM | POA: Diagnosis not present

## 2018-06-21 ENCOUNTER — Other Ambulatory Visit: Payer: Self-pay | Admitting: Family Medicine

## 2018-06-28 ENCOUNTER — Telehealth: Payer: Self-pay | Admitting: Family Medicine

## 2018-06-28 ENCOUNTER — Encounter: Payer: Self-pay | Admitting: Family Medicine

## 2018-06-28 ENCOUNTER — Ambulatory Visit (INDEPENDENT_AMBULATORY_CARE_PROVIDER_SITE_OTHER): Payer: BLUE CROSS/BLUE SHIELD | Admitting: Family Medicine

## 2018-06-28 VITALS — BP 108/68 | HR 70 | Temp 98.7°F | Resp 16 | Wt 225.8 lb

## 2018-06-28 DIAGNOSIS — Z021 Encounter for pre-employment examination: Secondary | ICD-10-CM

## 2018-06-28 DIAGNOSIS — R5383 Other fatigue: Secondary | ICD-10-CM | POA: Diagnosis not present

## 2018-06-28 DIAGNOSIS — K219 Gastro-esophageal reflux disease without esophagitis: Secondary | ICD-10-CM | POA: Diagnosis not present

## 2018-06-28 DIAGNOSIS — E782 Mixed hyperlipidemia: Secondary | ICD-10-CM | POA: Diagnosis not present

## 2018-06-28 DIAGNOSIS — G20 Parkinson's disease: Secondary | ICD-10-CM | POA: Diagnosis not present

## 2018-06-28 DIAGNOSIS — L309 Dermatitis, unspecified: Secondary | ICD-10-CM | POA: Diagnosis not present

## 2018-06-28 DIAGNOSIS — G20A1 Parkinson's disease without dyskinesia, without mention of fluctuations: Secondary | ICD-10-CM

## 2018-06-28 DIAGNOSIS — R0981 Nasal congestion: Secondary | ICD-10-CM | POA: Diagnosis not present

## 2018-06-28 NOTE — Progress Notes (Signed)
Patient: Kurt Maldonado Male    DOB: 27-Jul-1966   52 y.o.   MRN: [id number] Visit Date: 06/28/2018  Today's Provider: Megan Mans, MD   Chief Complaint  Patient presents with  . Employment Physical   Subjective:    HPI   Patient comes in office today for health screening for employment. Patient reports that he feels fairly well today but has had symptoms of fatigue and believes that it may be due to a sinus infection. Patient reports that he follows a healthy diet but is not actively exercising, on average patient sleeps 7hrs a night.  Allergies  Allergen Reactions  . Codeine Nausea And Vomiting and Other (See Comments)    Muscle tightness      Current Outpatient Medications:  .  Ascorbic Acid (VITAMIN C) 1000 MG tablet, Take by mouth., Disp: , Rfl:  .  cholecalciferol (VITAMIN D) 1000 units tablet, Take 5,000 Units by mouth daily. Take 5,000 units daily, Disp: , Rfl:  .  cholestyramine (QUESTRAN) 4 GM/DOSE powder, Take 0.5 packets (2 g total) by mouth 3 (three) times daily with meals., Disp: 378 g, Rfl: 12 .  cholestyramine light (PREVALITE) 4 g packet, Take 1 packet (4 g total) by mouth 2 (two) times daily., Disp: 60 packet, Rfl: 11 .  Cyanocobalamin (RA VITAMIN B-12 TR) 1000 MCG TBCR, Take by mouth., Disp: , Rfl:  .  diazepam (VALIUM) 5 MG tablet, TAKE 1/2-1 TAB BY MOUTH EVERY 6HRS AS NEEDED FOR SPASMS/SLEEP, Disp: 30 tablet, Rfl: 5 .  fluticasone (FLONASE) 50 MCG/ACT nasal spray, Instill 2 sprays in each nostril once daily., Disp: 16 g, Rfl: 11 .  omeprazole (PRILOSEC) 40 MG capsule, Take 1 capsule (40 mg total) by mouth daily., Disp: 90 capsule, Rfl: 3 .  rOPINIRole (REQUIP) 0.5 MG tablet, Take 0.5 mg by mouth at bedtime., Disp: , Rfl:  .  zolpidem (AMBIEN) 10 MG tablet, TAKE 1 TABLET BY MOUTH AT BEDTIME IF NEEDED FOR SLEEP, Disp: 15 tablet, Rfl: 5 .  oxymetazoline (AFRIN) 0.05 % nasal spray, Place 2 sprays into both nostrils 2 (two) times daily as needed for  congestion., Disp: , Rfl:  .  promethazine (PHENERGAN) 25 MG tablet, Take 1 tablet (25 mg total) by mouth every 6 (six) hours as needed for nausea or vomiting. (Patient not taking: Reported on 06/28/2018), Disp: 60 tablet, Rfl: 1 .  triamcinolone cream (KENALOG) 0.1 %, Apply 1 application topically 2 (two) times daily. (Patient not taking: Reported on 06/28/2018), Disp: 30 g, Rfl: 0 .  valACYclovir (VALTREX) 1000 MG tablet, Take 1 tablet (1,000 mg total) 3 (three) times daily by mouth. (Patient not taking: Reported on 06/28/2018), Disp: 21 tablet, Rfl: 0  Review of Systems  Constitutional: Positive for fatigue. Negative for activity change, appetite change, chills, diaphoresis, fever and unexpected weight change.  HENT: Positive for postnasal drip and sinus pressure. Negative for congestion, dental problem, drooling, ear discharge, ear pain, facial swelling, hearing loss, mouth sores, nosebleeds, rhinorrhea, sinus pain, sneezing, sore throat, tinnitus, trouble swallowing and voice change.   Eyes: Negative.   Respiratory: Negative.   Cardiovascular: Negative.   Gastrointestinal: Positive for diarrhea. Negative for abdominal distention, abdominal pain, anal bleeding, blood in stool, constipation, nausea and rectal pain.  Endocrine: Negative.   Genitourinary: Negative.  Negative for difficulty urinating.  Musculoskeletal: Negative.   Skin: Positive for rash.  Allergic/Immunologic: Negative.   Neurological: Negative.   Hematological: Negative.   Psychiatric/Behavioral: Negative.  Social History   Tobacco Use  . Smoking status: Never Smoker  . Smokeless tobacco: Never Used  Substance Use Topics  . Alcohol use: Yes    Comment: rare- 2 beers per month    Objective:   BP 108/68   Pulse 70   Temp 98.7 F (37.1 C) (Oral)   Resp 16   Wt 225 lb 12.8 oz (102.4 kg)   SpO2 97%   BMI 28.99 kg/m  Vitals:   06/28/18 1447  BP: 108/68  Pulse: 70  Resp: 16  Temp: 98.7 F (37.1 C)  TempSrc:  Oral  SpO2: 97%  Weight: 225 lb 12.8 oz (102.4 kg)     Physical Exam  Constitutional: He is oriented to person, place, and time. He appears well-developed and well-nourished.  HENT:  Head: Normocephalic and atraumatic.  Right Ear: External ear normal.  Left Ear: External ear normal.  Nose: Nose normal.  Eyes: Conjunctivae are normal. No scleral icterus.  Neck: No thyromegaly present.  Cardiovascular: Normal rate, regular rhythm and normal heart sounds.  Pulmonary/Chest: Effort normal and breath sounds normal.  Abdominal: Soft.  Musculoskeletal: He exhibits no edema.  Neurological: He is alert and oriented to person, place, and time.  Skin: Skin is warm and dry. Rash noted.  Trunk with patches of apparent numular eczema.        Assessment & Plan:     .1. Mixed hyperlipidemia Labs today--CPE few months.  2. Encounter for pre-employment health screening examination  - Comprehensive metabolic panel - Lipid panel  3. Gastro-esophageal reflux disease without esophagitis   4. Parkinson's disease (HCC)  5.RLS      I have done the exam and reviewed the above chart and it is accurate to the best of my knowledge. Dentist has been used in this note in any air is in the dictation or transcription are unintentional.  Megan Mans, MD  [facility]

## 2018-06-28 NOTE — Telephone Encounter (Signed)
Pt calling to check on status of his health form he left for Dr. Sullivan Lone to fill out. Please call pt back to let him know.  Thanks, [employer]

## 2018-06-29 ENCOUNTER — Ambulatory Visit: Payer: BLUE CROSS/BLUE SHIELD | Admitting: Family Medicine

## 2018-06-29 DIAGNOSIS — Z021 Encounter for pre-employment examination: Secondary | ICD-10-CM | POA: Diagnosis not present

## 2018-06-30 LAB — LIPID PANEL
CHOLESTEROL TOTAL: 182 mg/dL (ref 100–199)
Chol/HDL Ratio: 4.8 ratio (ref 0.0–5.0)
HDL: 38 mg/dL — ABNORMAL LOW (ref >39)
LDL Calculated: 127 mg/dL — ABNORMAL HIGH (ref 0–99)
Triglycerides: 85 mg/dL (ref 0–149)
VLDL CHOLESTEROL CAL: 17 mg/dL (ref 5–40)

## 2018-06-30 LAB — COMPREHENSIVE METABOLIC PANEL
ALBUMIN: 4.1 g/dL (ref 3.5–5.5)
ALK PHOS: 84 IU/L (ref 39–117)
ALT: 21 IU/L (ref 0–44)
AST: 22 IU/L (ref 0–40)
Albumin/Globulin Ratio: 1.6 (ref 1.2–2.2)
BUN / CREAT RATIO: 13 (ref 9–20)
BUN: 13 mg/dL (ref 6–24)
Bilirubin Total: 0.6 mg/dL (ref 0.0–1.2)
CO2: 25 mmol/L (ref 20–29)
CREATININE: 1.04 mg/dL (ref 0.76–1.27)
Calcium: 9.5 mg/dL (ref 8.7–10.2)
Chloride: 103 mmol/L (ref 96–106)
GFR calc Af Amer: 94 mL/min/{1.73_m2} (ref >59)
GFR calc non Af Amer: 82 mL/min/{1.73_m2} (ref >59)
GLUCOSE: 97 mg/dL (ref 65–99)
Globulin, Total: 2.6 g/dL (ref 1.5–4.5)
Potassium: 5 mmol/L (ref 3.5–5.2)
Sodium: 144 mmol/L (ref 134–144)
Total Protein: 6.7 g/dL (ref 6.0–8.5)

## 2018-07-05 ENCOUNTER — Telehealth: Payer: Self-pay | Admitting: Family Medicine

## 2018-07-05 DIAGNOSIS — R21 Rash and other nonspecific skin eruption: Secondary | ICD-10-CM

## 2018-07-05 MED ORDER — TRIAMCINOLONE ACETONIDE 0.1 % EX CREA: 1.0000 "application " | TOPICAL_CREAM | Freq: Two times a day (BID) | CUTANEOUS | 0 refills | Status: DC

## 2018-07-05 NOTE — Telephone Encounter (Signed)
Pt stated he saw Dr. Sullivan Lone for OV on 06/28/18 for rash and that Dr. Sullivan Lone was going to send in an Rx for a steroid cream for pt to try. Pt stated he has been checking with the pharmacy and they advised they haven't received Rx. Pt is requesting the Rx be sent to [facility] today if possible. Please advise. Thanks TNP

## 2018-07-05 NOTE — Telephone Encounter (Signed)
Advised  ED 

## 2018-07-09 DIAGNOSIS — M9904 Segmental and somatic dysfunction of sacral region: Secondary | ICD-10-CM | POA: Diagnosis not present

## 2018-07-09 DIAGNOSIS — M9903 Segmental and somatic dysfunction of lumbar region: Secondary | ICD-10-CM | POA: Diagnosis not present

## 2018-07-09 DIAGNOSIS — M5418 Radiculopathy, sacral and sacrococcygeal region: Secondary | ICD-10-CM | POA: Diagnosis not present

## 2018-07-09 DIAGNOSIS — M545 Low back pain: Secondary | ICD-10-CM | POA: Diagnosis not present

## 2018-07-10 DIAGNOSIS — G2581 Restless legs syndrome: Secondary | ICD-10-CM | POA: Diagnosis not present

## 2018-07-10 DIAGNOSIS — M9904 Segmental and somatic dysfunction of sacral region: Secondary | ICD-10-CM | POA: Diagnosis not present

## 2018-07-10 DIAGNOSIS — G43119 Migraine with aura, intractable, without status migrainosus: Secondary | ICD-10-CM | POA: Diagnosis not present

## 2018-07-10 DIAGNOSIS — R259 Unspecified abnormal involuntary movements: Secondary | ICD-10-CM | POA: Diagnosis not present

## 2018-07-10 DIAGNOSIS — M545 Low back pain: Secondary | ICD-10-CM | POA: Diagnosis not present

## 2018-07-10 DIAGNOSIS — M9903 Segmental and somatic dysfunction of lumbar region: Secondary | ICD-10-CM | POA: Diagnosis not present

## 2018-07-10 DIAGNOSIS — R419 Unspecified symptoms and signs involving cognitive functions and awareness: Secondary | ICD-10-CM | POA: Diagnosis not present

## 2018-07-10 DIAGNOSIS — M5418 Radiculopathy, sacral and sacrococcygeal region: Secondary | ICD-10-CM | POA: Diagnosis not present

## 2018-07-11 DIAGNOSIS — M5418 Radiculopathy, sacral and sacrococcygeal region: Secondary | ICD-10-CM | POA: Diagnosis not present

## 2018-07-11 DIAGNOSIS — M9904 Segmental and somatic dysfunction of sacral region: Secondary | ICD-10-CM | POA: Diagnosis not present

## 2018-07-11 DIAGNOSIS — M9903 Segmental and somatic dysfunction of lumbar region: Secondary | ICD-10-CM | POA: Diagnosis not present

## 2018-07-11 DIAGNOSIS — M545 Low back pain: Secondary | ICD-10-CM | POA: Diagnosis not present

## 2018-07-12 DIAGNOSIS — M5418 Radiculopathy, sacral and sacrococcygeal region: Secondary | ICD-10-CM | POA: Diagnosis not present

## 2018-07-12 DIAGNOSIS — M545 Low back pain: Secondary | ICD-10-CM | POA: Diagnosis not present

## 2018-07-12 DIAGNOSIS — M9903 Segmental and somatic dysfunction of lumbar region: Secondary | ICD-10-CM | POA: Diagnosis not present

## 2018-07-12 DIAGNOSIS — M9904 Segmental and somatic dysfunction of sacral region: Secondary | ICD-10-CM | POA: Diagnosis not present

## 2018-07-13 DIAGNOSIS — M9904 Segmental and somatic dysfunction of sacral region: Secondary | ICD-10-CM | POA: Diagnosis not present

## 2018-07-13 DIAGNOSIS — M5418 Radiculopathy, sacral and sacrococcygeal region: Secondary | ICD-10-CM | POA: Diagnosis not present

## 2018-07-13 DIAGNOSIS — M545 Low back pain: Secondary | ICD-10-CM | POA: Diagnosis not present

## 2018-07-13 DIAGNOSIS — M9903 Segmental and somatic dysfunction of lumbar region: Secondary | ICD-10-CM | POA: Diagnosis not present

## 2018-07-16 DIAGNOSIS — M5418 Radiculopathy, sacral and sacrococcygeal region: Secondary | ICD-10-CM | POA: Diagnosis not present

## 2018-07-16 DIAGNOSIS — M9903 Segmental and somatic dysfunction of lumbar region: Secondary | ICD-10-CM | POA: Diagnosis not present

## 2018-07-16 DIAGNOSIS — Z23 Encounter for immunization: Secondary | ICD-10-CM | POA: Diagnosis not present

## 2018-07-16 DIAGNOSIS — M9904 Segmental and somatic dysfunction of sacral region: Secondary | ICD-10-CM | POA: Diagnosis not present

## 2018-07-16 DIAGNOSIS — M545 Low back pain: Secondary | ICD-10-CM | POA: Diagnosis not present

## 2018-07-17 DIAGNOSIS — M5418 Radiculopathy, sacral and sacrococcygeal region: Secondary | ICD-10-CM | POA: Diagnosis not present

## 2018-07-17 DIAGNOSIS — M9904 Segmental and somatic dysfunction of sacral region: Secondary | ICD-10-CM | POA: Diagnosis not present

## 2018-07-17 DIAGNOSIS — M545 Low back pain: Secondary | ICD-10-CM | POA: Diagnosis not present

## 2018-07-17 DIAGNOSIS — M9903 Segmental and somatic dysfunction of lumbar region: Secondary | ICD-10-CM | POA: Diagnosis not present

## 2018-07-18 DIAGNOSIS — M545 Low back pain: Secondary | ICD-10-CM | POA: Diagnosis not present

## 2018-07-18 DIAGNOSIS — M9904 Segmental and somatic dysfunction of sacral region: Secondary | ICD-10-CM | POA: Diagnosis not present

## 2018-07-18 DIAGNOSIS — M9903 Segmental and somatic dysfunction of lumbar region: Secondary | ICD-10-CM | POA: Diagnosis not present

## 2018-07-18 DIAGNOSIS — M5418 Radiculopathy, sacral and sacrococcygeal region: Secondary | ICD-10-CM | POA: Diagnosis not present

## 2018-07-23 DIAGNOSIS — M545 Low back pain: Secondary | ICD-10-CM | POA: Diagnosis not present

## 2018-07-23 DIAGNOSIS — Z85828 Personal history of other malignant neoplasm of skin: Secondary | ICD-10-CM | POA: Diagnosis not present

## 2018-07-23 DIAGNOSIS — R21 Rash and other nonspecific skin eruption: Secondary | ICD-10-CM | POA: Diagnosis not present

## 2018-07-23 DIAGNOSIS — M9903 Segmental and somatic dysfunction of lumbar region: Secondary | ICD-10-CM | POA: Diagnosis not present

## 2018-07-23 DIAGNOSIS — L309 Dermatitis, unspecified: Secondary | ICD-10-CM | POA: Diagnosis not present

## 2018-07-23 DIAGNOSIS — M9904 Segmental and somatic dysfunction of sacral region: Secondary | ICD-10-CM | POA: Diagnosis not present

## 2018-07-23 DIAGNOSIS — M5418 Radiculopathy, sacral and sacrococcygeal region: Secondary | ICD-10-CM | POA: Diagnosis not present

## 2018-07-26 DIAGNOSIS — R002 Palpitations: Secondary | ICD-10-CM | POA: Diagnosis not present

## 2018-07-26 DIAGNOSIS — I10 Essential (primary) hypertension: Secondary | ICD-10-CM | POA: Diagnosis not present

## 2018-07-26 DIAGNOSIS — R55 Syncope and collapse: Secondary | ICD-10-CM | POA: Diagnosis not present

## 2018-07-26 DIAGNOSIS — I493 Ventricular premature depolarization: Secondary | ICD-10-CM | POA: Diagnosis not present

## 2018-07-30 ENCOUNTER — Other Ambulatory Visit: Payer: Self-pay | Admitting: Family Medicine

## 2018-07-30 DIAGNOSIS — G4700 Insomnia, unspecified: Secondary | ICD-10-CM

## 2018-07-30 DIAGNOSIS — B09 Unspecified viral infection characterized by skin and mucous membrane lesions: Secondary | ICD-10-CM | POA: Diagnosis not present

## 2018-07-31 ENCOUNTER — Encounter: Payer: Self-pay | Admitting: Family Medicine

## 2018-07-31 ENCOUNTER — Ambulatory Visit (INDEPENDENT_AMBULATORY_CARE_PROVIDER_SITE_OTHER): Payer: BLUE CROSS/BLUE SHIELD | Admitting: Family Medicine

## 2018-07-31 ENCOUNTER — Other Ambulatory Visit: Payer: Self-pay

## 2018-07-31 VITALS — BP 116/74 | HR 66 | Temp 97.8°F | Ht 74.0 in | Wt 224.4 lb

## 2018-07-31 DIAGNOSIS — R42 Dizziness and giddiness: Secondary | ICD-10-CM

## 2018-07-31 MED ORDER — MECLIZINE HCL 25 MG PO TABS: 25.0000 mg | ORAL_TABLET | Freq: Three times a day (TID) | ORAL | 0 refills | Status: DC | PRN

## 2018-07-31 NOTE — Progress Notes (Signed)
Patient: Kurt Maldonado Male    DOB: 1966-08-10   52 y.o.   MRN: [id number] Visit Date: 07/31/2018  Today's Provider: Dortha Kern, PA   Chief Complaint  Patient presents with  . Dizziness    started this morning 07/31/18   Subjective:    HPI  Pt reports that he woke up this morning with dizziness.  Went to pharmacy this morning 07/31/18 and checked his BP there and it was 152/82.  Pt was seen recently and sent to dermatology for a rash and they done a biopsy and they believe that it is something viral going on. Slight nausea at time without vomiting today. No headaches. No ataxia or URI symptoms. No hearing loss.     Past Medical History:  Diagnosis Date  . Anxiety   . Arthritis    hand   . BPH (benign prostatic hyperplasia)   . Collapsed lung    left lung- 1984   . CTS (carpal tunnel syndrome)   . Depression   . Dysrhythmia    pvc's per pt on no medication   . GERD (gastroesophageal reflux disease)   . History of kidney stones   . Migraines   . Parkinson's disease (HCC)   . REM behavioral disorder   . Restless legs syndrome (RLS)   . Syncopal episodes    08/2014 - followed by Dr Sherryll Burger at [place], abnormal EEG- - note 08/20/14 in Care Everywhere )    . Thyroid disease   . Tremor   . Vasovagal episode    Past Surgical History:  Procedure Laterality Date  . APPENDECTOMY    . CHOLECYSTECTOMY N/A 08/23/2017   Procedure: LAPAROSCOPIC CHOLECYSTECTOMY WITH INTRAOPERATIVE CHOLANGIOGRAM;  Surgeon: Nadeen Landau, MD;  Location: [facility] ORS;  Service: General;  Laterality: N/A;  . COLONOSCOPY  05/27/2014   adenomatous polyps and FH colon polyp-repeat 5 years per Dr Shelle Iron  . ESOPHAGOGASTRODUODENOSCOPY  05/27/2014   Esophagitis-no repeat per Dr Shelle Iron   . ESOPHAGOGASTRODUODENOSCOPY N/A 06/28/2017   Procedure: ESOPHAGOGASTRODUODENOSCOPY (EGD);  Surgeon: Toledo, Boykin Nearing, MD;  Location: [facility] ENDOSCOPY;  Service: Gastroenterology;  Laterality: N/A;  . PROSTATE  BIOPSY N/A 09/08/2014   Procedure: BIOPSY TRANSRECTAL ULTRASONIC PROSTATE (TUBP);  Surgeon: Heloise Purpura, MD;  Location: [facility] ORS;  Service: Urology;  Laterality: N/A;  . SHOULDER SURGERY     bilateral    Family History  Problem Relation Age of Onset  . Dementia Mother   . Cataracts Mother   . Osteoporosis Mother   . Arthritis Mother   . Alzheimer's disease Mother   . High blood pressure Mother   . Migraines Mother   . Hypertension Father   . Kidney cancer Father   . Diabetes Mellitus II Father   . Prostate cancer Father   . Colon polyps Father   . Graves' disease Other   . Parkinson's disease Paternal Grandmother   . Tremor Paternal Grandmother   . Colon cancer Neg Hx    Allergies  Allergen Reactions  . Codeine Nausea And Vomiting and Other (See Comments)    Muscle tightness     Current Outpatient Medications:  .  Ascorbic Acid (VITAMIN C) 1000 MG tablet, Take by mouth., Disp: , Rfl:  .  cholecalciferol (VITAMIN D) 1000 units tablet, Take 5,000 Units by mouth daily. Take 5,000 units daily, Disp: , Rfl:  .  cholestyramine light (PREVALITE) 4 g packet, Take 1 packet (4 g total) by mouth 2 (two) times daily.,  Disp: 60 packet, Rfl: 11 .  Cyanocobalamin (RA VITAMIN B-12 TR) 1000 MCG TBCR, Take by mouth., Disp: , Rfl:  .  diazepam (VALIUM) 5 MG tablet, TAKE 1/2-1 TAB BY MOUTH EVERY 6HRS AS NEEDED FOR SPASMS/SLEEP, Disp: 30 tablet, Rfl: 5 .  omeprazole (PRILOSEC) 40 MG capsule, Take 1 capsule (40 mg total) by mouth daily., Disp: 90 capsule, Rfl: 3 .  oxymetazoline (AFRIN) 0.05 % nasal spray, Place 2 sprays into both nostrils 2 (two) times daily as needed for congestion., Disp: , Rfl:  .  promethazine (PHENERGAN) 25 MG tablet, Take 1 tablet (25 mg total) by mouth every 6 (six) hours as needed for nausea or vomiting., Disp: 60 tablet, Rfl: 1 .  rOPINIRole (REQUIP) 0.5 MG tablet, Take 0.5 mg by mouth at bedtime., Disp: , Rfl:  .  triamcinolone cream (KENALOG) 0.1 %, Apply 1 application  topically 2 (two) times daily., Disp: 30 g, Rfl: 0 .  zolpidem (AMBIEN) 10 MG tablet, TAKE 1 TABLET BY MOUTH AT BEDTIME IF NEEDED FOR SLEEP, Disp: 15 tablet, Rfl: 5 .  cholestyramine (QUESTRAN) 4 GM/DOSE powder, Take 0.5 packets (2 g total) by mouth 3 (three) times daily with meals. (Patient not taking: Reported on 07/31/2018), Disp: 378 g, Rfl: 12 .  fluticasone (FLONASE) 50 MCG/ACT nasal spray, Instill 2 sprays in each nostril once daily. (Patient not taking: Reported on 07/31/2018), Disp: 16 g, Rfl: 11 .  valACYclovir (VALTREX) 1000 MG tablet, Take 1 tablet (1,000 mg total) 3 (three) times daily by mouth. (Patient not taking: Reported on 06/28/2018), Disp: 21 tablet, Rfl: 0  Review of Systems  Constitutional: Negative.   HENT: Negative.   Eyes: Negative.   Respiratory: Negative.   Cardiovascular: Negative.   Gastrointestinal: Negative.   Endocrine: Negative.   Genitourinary: Negative.   Musculoskeletal: Negative.   Skin: Negative.   Allergic/Immunologic: Negative.   Neurological: Positive for dizziness. Negative for tremors, seizures, syncope, facial asymmetry, speech difficulty, weakness, light-headedness, numbness and headaches.  Hematological: Negative.   Psychiatric/Behavioral: Negative.    Social History   Tobacco Use  . Smoking status: Never Smoker  . Smokeless tobacco: Never Used  Substance Use Topics  . Alcohol use: Yes    Comment: rare- 2 beers per month    Objective:   BP 116/74 (BP Location: Right Arm, Patient Position: Sitting, Cuff Size: Normal)   Pulse 66   Temp 97.8 F (36.6 C) (Oral)   Ht 6\' 2"  (1.88 m)   Wt 224 lb 6.4 oz (101.8 kg)   SpO2 97%   BMI 28.81 kg/m  Vitals:   07/31/18 1122  BP: 116/74  Pulse: 66  Temp: 97.8 F (36.6 C)  TempSrc: Oral  SpO2: 97%  Weight: 224 lb 6.4 oz (101.8 kg)  Height: 6\' 2"  (1.88 m)   Physical Exam  Constitutional: He is oriented to person, place, and time. He appears well-developed and well-nourished. No distress.    HENT:  Head: Normocephalic and atraumatic.  Right Ear: Hearing normal.  Left Ear: Hearing normal.  Nose: Nose normal.  Eyes: Conjunctivae and lids are normal. Right eye exhibits no discharge. Left eye exhibits no discharge. No scleral icterus.  Cardiovascular: Normal rate and regular rhythm.  Pulmonary/Chest: Effort normal and breath sounds normal. No respiratory distress.  Musculoskeletal: Normal range of motion.  Neurological: He is alert and oriented to person, place, and time. He displays normal reflexes. No cranial nerve deficit. He exhibits normal muscle tone. Coordination normal.  Dizzy/lightheaded sensation without nystagmus with  Dix-Hallpike Test. No dysphagia, weakness, speech difficulties or ataxia. No hearing loss.  Skin: Skin is intact. No lesion and no rash noted.  Psychiatric: He has a normal mood and affect. His speech is normal and behavior is normal. Thought content normal.      Assessment & Plan:     1. Dizziness Onset this morning when he woke up to get out of bed. Difficulty describing lightheaded/dizzy/out-of-body sensation. No nystagmus but some nausea when testing Dix-Hallpike Test. Will treat with Meclizine. If new or worsening symptoms, may need recheck or neurology referral. History of MRI of brain 06-16-16 without acute or subacute ischemic changes. Followed by Dr. Sherryll Burger (neurologist) for restless leg syndrome and Parkinson's-type symptoms. Some white matter changes on MRI but no diagnosis of MS or tumor. Recent biopsy by dermatologist for rash that is pending (suspected viral illness). Call or return prn. - meclizine (ANTIVERT) 25 MG tablet; Take 1 tablet (25 mg total) by mouth 3 (three) times daily as needed for dizziness.  Dispense: 30 tablet; Refill: 0       Dortha Kern, PA  [facility]

## 2018-08-02 ENCOUNTER — Telehealth: Payer: Self-pay | Admitting: Family Medicine

## 2018-08-02 NOTE — Telephone Encounter (Signed)
Minimal change in lightheaded/dizzy sensation. States he is not nauseated now but has sensation hit when he tries to get up or move his head. Has not fallen. Hears a sound of a "ball bouncing" in his ears with these episodes. No hearing loss, headache or vision changes. Will continue Antivert prn and contact us in the morning. If symptoms not improved, will be willing to be referred to physical therapy for vertigo assessment and therapy. May need to schedule for MRI and/or neurology referral versus ENT referral

## 2018-08-02 NOTE — Telephone Encounter (Signed)
Pt states he say Maurine Minister on Monday and he is still having dizzy spells and when he turns his head.  States he called ENT and they stated they didn't have any appt today but would send him to physical therapy for Vertigo.  Pt is concerned and states he doesn't feel like physical therapy will help vertigo and wants to speak with someone about this.

## 2018-08-02 NOTE — Telephone Encounter (Signed)
Dr Sullivan Lone, please advise

## 2018-08-02 NOTE — Telephone Encounter (Signed)
Pt calling on status of question.  Please advise.  Thanks, [employer]

## 2018-08-02 NOTE — Telephone Encounter (Signed)
Please advise 

## 2018-08-03 ENCOUNTER — Ambulatory Visit (INDEPENDENT_AMBULATORY_CARE_PROVIDER_SITE_OTHER): Payer: BLUE CROSS/BLUE SHIELD | Admitting: Family Medicine

## 2018-08-03 ENCOUNTER — Telehealth: Payer: Self-pay | Admitting: Family Medicine

## 2018-08-03 ENCOUNTER — Encounter: Payer: Self-pay | Admitting: Family Medicine

## 2018-08-03 VITALS — BP 114/84 | HR 71 | Temp 97.5°F | Ht 74.0 in | Wt 220.4 lb

## 2018-08-03 DIAGNOSIS — M791 Myalgia, unspecified site: Secondary | ICD-10-CM

## 2018-08-03 DIAGNOSIS — M79606 Pain in leg, unspecified: Secondary | ICD-10-CM

## 2018-08-03 DIAGNOSIS — L309 Dermatitis, unspecified: Secondary | ICD-10-CM | POA: Diagnosis not present

## 2018-08-03 DIAGNOSIS — R5383 Other fatigue: Secondary | ICD-10-CM

## 2018-08-03 DIAGNOSIS — R42 Dizziness and giddiness: Secondary | ICD-10-CM

## 2018-08-03 MED ORDER — PREDNISONE 10 MG (48) PO TBPK: ORAL_TABLET | ORAL | 0 refills | Status: DC

## 2018-08-03 NOTE — Telephone Encounter (Signed)
Pt came in to see Dr Sullivan Lone today 08/03/18.  He is still having a lot of dizziness.  dbs

## 2018-08-03 NOTE — Telephone Encounter (Signed)
Pt calling back to let Maurine Minister know he isn't feeling better.  Pt had some other symptoms pop up.  Pt wanting to speak with Maurine Minister.  Thanks, [employer]

## 2018-08-03 NOTE — Progress Notes (Signed)
Patient: Kurt Maldonado Male    DOB: 1966/03/29   52 y.o.   MRN: [id number] Visit Date: 08/03/2018  Today's Provider: Wilhemena Durie, MD   Chief Complaint  Patient presents with  . Dizziness    started tuesday 07/31/18   Subjective:    HPI  Pt rep[orts that he is still having dizziness since Tuesday 07/31/18  and also having cramps in both thighs since yesterday 08/02/18.  Pt reports he is having to stay in the bed most of the time.  Pt reports he also has a rash that has been noticed for 2 months and dermatologist has seen him for that. He has a pathology report of his skin biopsy which mentions possible old arthropod bite? Overall he does not feel well and feels dizzy and fatigued.     Allergies  Allergen Reactions  . Codeine Nausea And Vomiting and Other (See Comments)    Muscle tightness      Current Outpatient Medications:  .  Ascorbic Acid (VITAMIN C) 1000 MG tablet, Take by mouth., Disp: , Rfl:  .  cholecalciferol (VITAMIN D) 1000 units tablet, Take 5,000 Units by mouth daily. Take 5,000 units daily, Disp: , Rfl:  .  cholestyramine (QUESTRAN) 4 GM/DOSE powder, Take 0.5 packets (2 g total) by mouth 3 (three) times daily with meals. (Patient not taking: Reported on 07/31/2018), Disp: 378 g, Rfl: 12 .  cholestyramine light (PREVALITE) 4 g packet, Take 1 packet (4 g total) by mouth 2 (two) times daily., Disp: 60 packet, Rfl: 11 .  Cyanocobalamin (RA VITAMIN B-12 TR) 1000 MCG TBCR, Take by mouth., Disp: , Rfl:  .  diazepam (VALIUM) 5 MG tablet, TAKE 1/2-1 TAB BY MOUTH EVERY 6HRS AS NEEDED FOR SPASMS/SLEEP, Disp: 30 tablet, Rfl: 5 .  fluticasone (FLONASE) 50 MCG/ACT nasal spray, Instill 2 sprays in each nostril once daily. (Patient not taking: Reported on 07/31/2018), Disp: 16 g, Rfl: 11 .  meclizine (ANTIVERT) 25 MG tablet, Take 1 tablet (25 mg total) by mouth 3 (three) times daily as needed for dizziness., Disp: 30 tablet, Rfl: 0 .  omeprazole (PRILOSEC) 40 MG  capsule, Take 1 capsule (40 mg total) by mouth daily., Disp: 90 capsule, Rfl: 3 .  oxymetazoline (AFRIN) 0.05 % nasal spray, Place 2 sprays into both nostrils 2 (two) times daily as needed for congestion., Disp: , Rfl:  .  promethazine (PHENERGAN) 25 MG tablet, Take 1 tablet (25 mg total) by mouth every 6 (six) hours as needed for nausea or vomiting., Disp: 60 tablet, Rfl: 1 .  rOPINIRole (REQUIP) 0.5 MG tablet, Take 0.5 mg by mouth at bedtime., Disp: , Rfl:  .  triamcinolone cream (KENALOG) 0.1 %, Apply 1 application topically 2 (two) times daily., Disp: 30 g, Rfl: 0 .  valACYclovir (VALTREX) 1000 MG tablet, Take 1 tablet (1,000 mg total) 3 (three) times daily by mouth. (Patient not taking: Reported on 06/28/2018), Disp: 21 tablet, Rfl: 0 .  zolpidem (AMBIEN) 10 MG tablet, TAKE 1 TABLET BY MOUTH AT BEDTIME AS NEEDED FOR SLEEP, Disp: 15 tablet, Rfl: 5  Review of Systems  Constitutional: Negative.   HENT: Negative.   Eyes: Negative.   Respiratory: Negative.   Cardiovascular: Negative.   Endocrine: Negative.   Allergic/Immunologic: Negative.   Neurological: Positive for dizziness and light-headedness.  Psychiatric/Behavioral: Negative.     Social History   Tobacco Use  . Smoking status: Never Smoker  . Smokeless tobacco: Never Used  Substance  Use Topics  . Alcohol use: Yes    Comment: rare- 2 beers per month    Objective:   BP 114/84 (BP Location: Left Arm, Patient Position: Sitting, Cuff Size: Normal)   Pulse 71   Temp (!) 97.5 F (36.4 C) (Oral)   Ht '6\' 2"'  (1.88 m)   Wt 220 lb 6.4 oz (100 kg)   SpO2 99%   BMI 28.30 kg/m  Vitals:   08/03/18 1332  BP: 114/84  Pulse: 71  Temp: (!) 97.5 F (36.4 C)  TempSrc: Oral  SpO2: 99%  Weight: 220 lb 6.4 oz (100 kg)  Height: '6\' 2"'  (1.88 m)     Physical Exam  Constitutional: He is oriented to person, place, and time. He appears well-developed and well-nourished.  HENT:  Head: Normocephalic and atraumatic.  Right Ear: External  ear normal.  Left Ear: External ear normal.  Nose: Nose normal.  Mouth/Throat: Oropharynx is clear and moist.  Eyes: Conjunctivae are normal. No scleral icterus.  Neck: No thyromegaly present.  Cardiovascular: Normal rate, regular rhythm and normal heart sounds.  Pulmonary/Chest: Effort normal and breath sounds normal.  Abdominal: Soft.  Musculoskeletal: He exhibits no edema.  Neurological: He is alert and oriented to person, place, and time.  Skin: Skin is warm and dry.  Psychiatric: He has a normal mood and affect. His behavior is normal. Judgment and thought content normal.        Assessment & Plan:     1. Vertigo Still an issue.  2. Dermatitis Try taper and see how pt feels . May need ID consult in future. - predniSONE (STERAPRED UNI-PAK 48 TAB) 10 MG (48) TBPK tablet; Taper as directed.  Dispense: 48 tablet; Refill: 0  3. Myalgia  - CK - Antinuclear Antib (ANA) - Sed Rate (ESR)  4. Pain of lower extremity, unspecified laterality  - Comprehensive metabolic panel - Antinuclear Antib (ANA) - Lyme disease dna by pcr(borrelia burg) - predniSONE (STERAPRED UNI-PAK 48 TAB) 10 MG (48) TBPK tablet; Taper as directed.  Dispense: 48 tablet; Refill: 0  5. Other fatigue Post viral seems likely. - CBC with Differential/Platelet - Antinuclear Antib (ANA)        Wilhemena Durie, MD  [facility]

## 2018-08-04 ENCOUNTER — Ambulatory Visit: Payer: BLUE CROSS/BLUE SHIELD | Admitting: Family Medicine

## 2018-08-07 LAB — LYME DISEASE DNA BY PCR(BORRELIA BURG): Lyme (B. burgdorferi) PCR: NEGATIVE

## 2018-08-07 LAB — COMPREHENSIVE METABOLIC PANEL
ALK PHOS: 96 IU/L (ref 39–117)
ALT: 32 IU/L (ref 0–44)
AST: 27 IU/L (ref 0–40)
Albumin/Globulin Ratio: 1.7 (ref 1.2–2.2)
Albumin: 4.3 g/dL (ref 3.5–5.5)
BILIRUBIN TOTAL: 0.4 mg/dL (ref 0.0–1.2)
BUN/Creatinine Ratio: 10 (ref 9–20)
BUN: 10 mg/dL (ref 6–24)
CHLORIDE: 103 mmol/L (ref 96–106)
CO2: 24 mmol/L (ref 20–29)
Calcium: 9.6 mg/dL (ref 8.7–10.2)
Creatinine, Ser: 0.96 mg/dL (ref 0.76–1.27)
GFR calc non Af Amer: 90 mL/min/{1.73_m2} (ref >59)
GFR, EST AFRICAN AMERICAN: 104 mL/min/{1.73_m2} (ref >59)
GLUCOSE: 82 mg/dL (ref 65–99)
Globulin, Total: 2.6 g/dL (ref 1.5–4.5)
Potassium: 4.8 mmol/L (ref 3.5–5.2)
Sodium: 140 mmol/L (ref 134–144)
TOTAL PROTEIN: 6.9 g/dL (ref 6.0–8.5)

## 2018-08-07 LAB — CBC WITH DIFFERENTIAL/PLATELET
Basophils Absolute: 0.1 10*3/uL (ref 0.0–0.2)
Basos: 1 %
EOS (ABSOLUTE): 0.4 10*3/uL (ref 0.0–0.4)
Eos: 7 %
Hematocrit: 46 % (ref 37.5–51.0)
Hemoglobin: 15.6 g/dL (ref 13.0–17.7)
IMMATURE GRANS (ABS): 0 10*3/uL (ref 0.0–0.1)
Immature Granulocytes: 0 %
LYMPHS: 32 %
Lymphocytes Absolute: 2.1 10*3/uL (ref 0.7–3.1)
MCH: 32 pg (ref 26.6–33.0)
MCHC: 33.9 g/dL (ref 31.5–35.7)
MCV: 94 fL (ref 79–97)
Monocytes Absolute: 0.6 10*3/uL (ref 0.1–0.9)
Monocytes: 9 %
NEUTROS ABS: 3.3 10*3/uL (ref 1.4–7.0)
NEUTROS PCT: 51 %
Platelets: 307 10*3/uL (ref 150–450)
RBC: 4.88 x10E6/uL (ref 4.14–5.80)
RDW: 12.8 % (ref 12.3–15.4)
WBC: 6.5 10*3/uL (ref 3.4–10.8)

## 2018-08-07 LAB — CK: CK TOTAL: 43 U/L (ref 24–204)

## 2018-08-07 LAB — ANA: ANA: NEGATIVE

## 2018-08-07 LAB — SEDIMENTATION RATE: Sed Rate: 3 mm/hr (ref 0–30)

## 2018-08-09 ENCOUNTER — Encounter: Payer: Self-pay | Admitting: Family Medicine

## 2018-08-09 ENCOUNTER — Ambulatory Visit (INDEPENDENT_AMBULATORY_CARE_PROVIDER_SITE_OTHER): Payer: BLUE CROSS/BLUE SHIELD | Admitting: Family Medicine

## 2018-08-09 VITALS — BP 130/74 | HR 68 | Temp 98.0°F | Resp 16 | Wt 220.0 lb

## 2018-08-09 DIAGNOSIS — G933 Postviral fatigue syndrome: Secondary | ICD-10-CM

## 2018-08-09 DIAGNOSIS — R42 Dizziness and giddiness: Secondary | ICD-10-CM | POA: Diagnosis not present

## 2018-08-09 DIAGNOSIS — G9331 Postviral fatigue syndrome: Secondary | ICD-10-CM

## 2018-08-09 DIAGNOSIS — L309 Dermatitis, unspecified: Secondary | ICD-10-CM | POA: Diagnosis not present

## 2018-08-09 NOTE — Progress Notes (Signed)
Patient: Kurt Maldonado Male    DOB: 06-02-1966   52 y.o.   MRN: [id number] Visit Date: 08/09/2018  Today's Provider: Megan Mans, MD   Chief Complaint  Patient presents with  . Follow-up   Subjective:    HPI Patient comes in today for a follow up. He was seen in the office 1 week ago c/o vertigo. He was also prescribed prednisone for a rash, and reports that he is tolerating the medication well. He is having more trouble sleeping at night, but feels this may be due to the prednisone. He has not had to take meclizine for dizziness in the last 2 days.  Overall pt has not felt well since Mono like illness last year     Allergies  Allergen Reactions  . Codeine Nausea And Vomiting and Other (See Comments)    Muscle tightness      Current Outpatient Medications:  .  cholestyramine light (PREVALITE) 4 g packet, Take 1 packet (4 g total) by mouth 2 (two) times daily., Disp: 60 packet, Rfl: 11 .  Cyanocobalamin (RA VITAMIN B-12 TR) 1000 MCG TBCR, Take by mouth., Disp: , Rfl:  .  diazepam (VALIUM) 5 MG tablet, TAKE 1/2-1 TAB BY MOUTH EVERY 6HRS AS NEEDED FOR SPASMS/SLEEP, Disp: 30 tablet, Rfl: 5 .  meclizine (ANTIVERT) 25 MG tablet, Take 1 tablet (25 mg total) by mouth 3 (three) times daily as needed for dizziness., Disp: 30 tablet, Rfl: 0 .  omeprazole (PRILOSEC) 40 MG capsule, Take 1 capsule (40 mg total) by mouth daily., Disp: 90 capsule, Rfl: 3 .  predniSONE (STERAPRED UNI-PAK 48 TAB) 10 MG (48) TBPK tablet, Taper as directed., Disp: 48 tablet, Rfl: 0 .  promethazine (PHENERGAN) 25 MG tablet, Take 1 tablet (25 mg total) by mouth every 6 (six) hours as needed for nausea or vomiting., Disp: 60 tablet, Rfl: 1 .  rOPINIRole (REQUIP) 0.5 MG tablet, Take 0.5 mg by mouth at bedtime., Disp: , Rfl:  .  triamcinolone cream (KENALOG) 0.1 %, Apply 1 application topically 2 (two) times daily., Disp: 30 g, Rfl: 0 .  zolpidem (AMBIEN) 10 MG tablet, TAKE 1 TABLET BY MOUTH AT BEDTIME AS  NEEDED FOR SLEEP, Disp: 15 tablet, Rfl: 5 .  Ascorbic Acid (VITAMIN C) 1000 MG tablet, Take by mouth., Disp: , Rfl:  .  cholecalciferol (VITAMIN D) 1000 units tablet, Take 5,000 Units by mouth daily. Take 5,000 units daily, Disp: , Rfl:  .  cholestyramine (QUESTRAN) 4 GM/DOSE powder, Take 0.5 packets (2 g total) by mouth 3 (three) times daily with meals. (Patient not taking: Reported on 07/31/2018), Disp: 378 g, Rfl: 12 .  fluticasone (FLONASE) 50 MCG/ACT nasal spray, Instill 2 sprays in each nostril once daily. (Patient not taking: Reported on 07/31/2018), Disp: 16 g, Rfl: 11 .  oxymetazoline (AFRIN) 0.05 % nasal spray, Place 2 sprays into both nostrils 2 (two) times daily as needed for congestion., Disp: , Rfl:  .  valACYclovir (VALTREX) 1000 MG tablet, Take 1 tablet (1,000 mg total) 3 (three) times daily by mouth. (Patient not taking: Reported on 06/28/2018), Disp: 21 tablet, Rfl: 0  Review of Systems  Constitutional: Positive for fatigue.  HENT: Negative.   Eyes: Negative.   Respiratory: Negative.   Cardiovascular: Negative for chest pain, palpitations and leg swelling.  Musculoskeletal: Negative.   Skin: Positive for rash.  Allergic/Immunologic: Negative.   Neurological: Negative for dizziness, light-headedness and headaches.  Psychiatric/Behavioral: Positive for agitation.  Social History   Tobacco Use  . Smoking status: Never Smoker  . Smokeless tobacco: Never Used  Substance Use Topics  . Alcohol use: Yes    Comment: rare- 2 beers per month    Objective:   BP 130/74 (BP Location: Right Arm, Patient Position: Sitting, Cuff Size: Large)   Pulse 68   Temp 98 F (36.7 C)   Resp 16   Wt 220 lb (99.8 kg)   SpO2 98%   BMI 28.25 kg/m  Vitals:   08/09/18 1326  BP: 130/74  Pulse: 68  Resp: 16  Temp: 98 F (36.7 C)  SpO2: 98%  Weight: 220 lb (99.8 kg)     Physical Exam  Constitutional: He is oriented to person, place, and time. He appears well-developed.  HENT:    Head: Normocephalic and atraumatic.  Right Ear: External ear normal.  Left Ear: External ear normal.  Nose: Nose normal.  Eyes: Conjunctivae are normal. No scleral icterus.  Neck: No thyromegaly present.  Cardiovascular: Normal rate, regular rhythm and normal heart sounds.  Pulmonary/Chest: Effort normal and breath sounds normal.  Abdominal: Soft.  Musculoskeletal: He exhibits no edema.  Neurological: He is alert and oriented to person, place, and time.  Skin: Skin is warm and dry. Rash noted.  Mild rash on back.        Assessment & Plan:     1. Vertigo Unusual symptoms--Encephalitis? - Ambulatory referral to Neurology - Ambulatory referral to Infectious Disease  2. Dermatitis  - Ambulatory referral to Infectious Disease  3. Postviral fatigue syndromee   Could this an insect vector illness. Refer to ID. More than 25 minutes spent in coordination of care.  I have done the exam and reviewed the chart and it is accurate to the best of my knowledge. Dentist has been used and  any errors in dictation or transcription are unintentional. Julieanne Manson M.D. [facility]       Megan Mans, MD  [facility]

## 2018-08-16 ENCOUNTER — Encounter: Payer: Self-pay | Admitting: Infectious Diseases

## 2018-08-16 ENCOUNTER — Other Ambulatory Visit
Admission: RE | Admit: 2018-08-16 | Discharge: 2018-08-16 | Disposition: A | Discharge status: Home / Self Care | Payer: BLUE CROSS/BLUE SHIELD | Source: Ambulatory Visit | Attending: Infectious Diseases | Admitting: Infectious Diseases

## 2018-08-16 ENCOUNTER — Ambulatory Visit: Payer: BLUE CROSS/BLUE SHIELD | Attending: Infectious Diseases | Admitting: Infectious Diseases

## 2018-08-16 VITALS — BP 105/71 | HR 72 | Temp 97.9°F | Wt 222.4 lb

## 2018-08-16 DIAGNOSIS — R259 Unspecified abnormal involuntary movements: Secondary | ICD-10-CM | POA: Diagnosis not present

## 2018-08-16 DIAGNOSIS — Z885 Allergy status to narcotic agent status: Secondary | ICD-10-CM

## 2018-08-16 DIAGNOSIS — G4733 Obstructive sleep apnea (adult) (pediatric): Secondary | ICD-10-CM | POA: Diagnosis not present

## 2018-08-16 DIAGNOSIS — R42 Dizziness and giddiness: Secondary | ICD-10-CM | POA: Diagnosis not present

## 2018-08-16 DIAGNOSIS — R5383 Other fatigue: Secondary | ICD-10-CM | POA: Diagnosis not present

## 2018-08-16 DIAGNOSIS — R21 Rash and other nonspecific skin eruption: Secondary | ICD-10-CM

## 2018-08-16 DIAGNOSIS — G43119 Migraine with aura, intractable, without status migrainosus: Secondary | ICD-10-CM | POA: Diagnosis not present

## 2018-08-16 DIAGNOSIS — G20 Parkinson's disease: Secondary | ICD-10-CM

## 2018-08-16 DIAGNOSIS — G479 Sleep disorder, unspecified: Secondary | ICD-10-CM

## 2018-08-16 DIAGNOSIS — G2581 Restless legs syndrome: Secondary | ICD-10-CM | POA: Diagnosis not present

## 2018-08-16 NOTE — Patient Instructions (Addendum)
You have been referred to me for a rash you had for 2 months and that has now completely resolved with  Prednisone- the biopsy was non specific-  The picture of the rash ( Pityriasis roscea, Mycoses Fungoides, urticarial vasculits)    you have also had symptoms of fatigue since 1 year with lack of energy. No fever or joint swelling. The EBV test done ilast year shows enhanced IgG antibodies but no IgM and this only indicates you have had mono in the past but not a new infection. The antibodies could be high because of other underlying condition Today will check RPR,HV and lyme  Will let you know once I have the results

## 2018-08-16 NOTE — Progress Notes (Signed)
NAME: Kurt Maldonado Lazard  DOB: 1966-03-12  MRN: [id number]  Date/Time: 08/16/2018 11:07 AM Subjective:  REASON FOR CONSULT: rash ? Kurt Maldonado In[place]o is a 52 y.o. male with a history of restless leg syndrome, parkinsonian like illness, h/o vasovagal episodes is here to  for a rash- As per patient he has been unwell for a year. He had a shoulder surgery following which ne has been tired, lack of energy and sleeping more. This happened last year. His PCP checked him for EBV and the IgG was very high ( IgM was not elevated) . He says he was told that he had mono. He plays golf and this year in August he had gone to [place] to play [place] and when he returned home his wife noted  An erythematous  lesion on his left foot like a bruise which the patient was not aware of. There was no insect bite. Soon after that he developed lesions on his back- it started with a round evenly erythematous macule and a few more lesions followed that.     These lesions were not itchy or painful- He saw dermatologist who suspected eczema VS CTCL and  did a punch biopsy and the report was perivascular infiltration of lymphocytes and histiocytes with a wide DD of viral, drug allergy or insect bite. He was given a course of steroids which he completed last week and the lesions have resolved completely.       Pt is overall feeling better than last year. No fever or chills, no weight loss, no joint swelling, no GI symptoms, No cough or SOB     He is followe dby Dr.Shah neurologist for CNS and has been investigated in the past  for MS and had LP which was normal  . MRI was normal         Past Medical History:  Diagnosis Date  . Anxiety   . Arthritis    hand   . BPH (benign prostatic hyperplasia)   . Collapsed lung    left lung- 1984   . CTS (carpal tunnel syndrome)   . Depression   . Dysrhythmia    pvc's per pt on no medication   . GERD (gastroesophageal reflux disease)   . History of kidney stones   . Migraines     . Parkinson's disease (HCC)   . REM behavioral disorder   . Restless legs syndrome (RLS)   . Syncopal episodes    08/2014 - [facility], abnormal EEG- - note 08/20/14 in Care Everywhere )    . Thyroid disease   . Tremor   . Vasovagal episode     Past Surgical History:  Procedure Laterality Date  . APPENDECTOMY    . CHOLECYSTECTOMY N/A 08/23/2017   Procedure: LAPAROSCOPIC CHOLECYSTECTOMY WITH INTRAOPERATIVE CHOLANGIOGRAM;  Surgeon: Nadeen LandauSmith, Jarvis Wilton, MD;  Location: [facility] ORS;  Service: General;  Laterality: N/A;  . COLONOSCOPY  05/27/2014   adenomatous polyps and FH colon polyp-repeat 5 years per Dr Shelle Ironein  . ESOPHAGOGASTRODUODENOSCOPY  05/27/2014   Esophagitis-no repeat per Dr Shelle Ironein   . ESOPHAGOGASTRODUODENOSCOPY N/A 06/28/2017   Procedure: ESOPHAGOGASTRODUODENOSCOPY (EGD);  Surgeon: Toledo, Boykin Nearingeodoro K, MD;  Location: [facility] ENDOSCOPY;  Service: Gastroenterology;  Laterality: N/A;  . PROSTATE BIOPSY N/A 09/08/2014   Procedure: BIOPSY TRANSRECTAL ULTRASONIC PROSTATE (TUBP);  Surgeon: Heloise PurpuraLester Borden, MD;  Location: [facility] ORS;  Service: Urology;  Laterality: N/A;  . SHOULDER SURGERY     bilateral     SH  Lives with wife Non smoker Drinks beer No hard drugs  Family History  Problem Relation Age of Onset  . Dementia Mother   . Cataracts Mother   . Osteoporosis Mother   . Arthritis Mother   . Alzheimer's disease Mother   . High blood pressure Mother   . Migraines Mother   . Hypertension Father   . Kidney cancer Father   . Diabetes Mellitus II Father   . Prostate cancer Father   . Colon polyps Father   . Graves' disease Other   . Parkinson's disease Paternal Grandmother   . Tremor Paternal Grandmother   . Colon cancer Neg Hx    Allergies  Allergen Reactions  . Codeine Nausea And Vomiting and Other (See Comments)    Muscle tightness    ? Current Outpatient Medications  Medication Sig Dispense Refill  . cholecalciferol (VITAMIN D) 1000 units tablet Take  5,000 Units by mouth daily. Take 5,000 units daily    . Cyanocobalamin (RA VITAMIN B-12 TR) 1000 MCG TBCR Take by mouth.    . diazepam (VALIUM) 5 MG tablet TAKE 1/2-1 TAB BY MOUTH EVERY 6HRS AS NEEDED FOR SPASMS/SLEEP 30 tablet 5  . omeprazole (PRILOSEC) 40 MG capsule Take 1 capsule (40 mg total) by mouth daily. 90 capsule 3  . promethazine (PHENERGAN) 25 MG tablet Take 1 tablet (25 mg total) by mouth every 6 (six) hours as needed for nausea or vomiting. 60 tablet 1  . rOPINIRole (REQUIP) 0.5 MG tablet Take 0.5 mg by mouth at bedtime.    . triamcinolone cream (KENALOG) 0.1 % Apply 1 application topically 2 (two) times daily. 30 g 0  . valACYclovir (VALTREX) 1000 MG tablet Take 1 tablet (1,000 mg total) 3 (three) times daily by mouth. 21 tablet 0  . zolpidem (AMBIEN) 10 MG tablet TAKE 1 TABLET BY MOUTH AT BEDTIME AS NEEDED FOR SLEEP 15 tablet 5  . Ascorbic Acid (VITAMIN C) 1000 MG tablet Take by mouth.    . cholestyramine (QUESTRAN) 4 GM/DOSE powder Take 0.5 packets (2 g total) by mouth 3 (three) times daily with meals. (Patient not taking: Reported on 07/31/2018) 378 g 12  . cholestyramine light (PREVALITE) 4 g packet Take 1 packet (4 g total) by mouth 2 (two) times daily. (Patient not taking: Reported on 08/16/2018) 60 packet 11  . fluticasone (FLONASE) 50 MCG/ACT nasal spray Instill 2 sprays in each nostril once daily. (Patient not taking: Reported on 07/31/2018) 16 g 11  . meclizine (ANTIVERT) 25 MG tablet Take 1 tablet (25 mg total) by mouth 3 (three) times daily as needed for dizziness. (Patient not taking: Reported on 08/16/2018) 30 tablet 0  . oxymetazoline (AFRIN) 0.05 % nasal spray Place 2 sprays into both nostrils 2 (two) times daily as needed for congestion.    . predniSONE (STERAPRED UNI-PAK 48 TAB) 10 MG (48) TBPK tablet Taper as directed. (Patient not taking: Reported on 08/16/2018) 48 tablet 0   No current facility-administered medications for this visit.      Abtx:   Anti-infectives (From admission, onward)   None      REVIEW OF SYSTEMS:  Const: negative fever, negative chills, negative weight loss Eyes: negative diplopia or visual changes, negative eye pain ENT: negative coryza, negative sore throat Resp: negative cough, hemoptysis, dyspnea Cards: negative for chest pain, palpitations, lower extremity edema GU: negative for frequency, dysuria and hematuria GI: Negative for abdominal pain, diarrhea, bleeding, constipation Skin: as above Heme: negative for easy bruising and gum/nose  bleeding MS: as above Neurolo:negative for headaches, has syncope/dizziness, vertigo, memory problems , restless legs, sleep disorder Psych: negative for feelings of anxiety, depression  Endocrine: no polyuria Allergy/Immunology- negative for any medication or food allergies ?  Objective:  VITALS:  BP 105/71 (BP Location: Right Arm, Patient Position: Sitting, Cuff Size: Normal)   Pulse 72   Temp 97.9 F (36.6 C) (Oral)   Wt 222 lb 6 oz (100.9 kg)   BMI 28.55 kg/m  PHYSICAL EXAM:  General: Alert, cooperative, no distress, appears stated age.  Head: Normocephalic, without obvious abnormality, atraumatic. Eyes: Conjunctivae clear, anicteric sclerae. Pupils are equal ENT Nares normal. No drainage or sinus tenderness. Lips, mucosa, and tongue normal. No Thrush Neck: Supple, symmetrical, no adenopathy, thyroid: non tender no carotid bruit and no JVD. Back: No CVA tenderness. Lungs: Clear to auscultation bilaterally. No Wheezing or Rhonchi. No rales. Heart: Regular rate and rhythm, no murmur, rub or gallop. Abdomen: Soft, non-tender,not distended. Bowel sounds normal. No masses Extremities: atraumatic, no cyanosis. No edema. No clubbing Skin: No rashes or lesions. Or bruising Lymph: Cervical, supraclavicular normal. Neurologic: Grossly non-focal Pertinent Labs Lab Results CBC    Component Value Date/Time   WBC 6.5 08/03/2018 1443   WBC 6.0 08/02/2017  1344   RBC 4.88 08/03/2018 1443   RBC 4.63 08/02/2017 1344   HGB 15.6 08/03/2018 1443   HCT 46.0 08/03/2018 1443   PLT 307 08/03/2018 1443   MCV 94 08/03/2018 1443   MCV 94 07/29/2014 0758   MCH 32.0 08/03/2018 1443   MCH 31.3 08/02/2017 1344   MCHC 33.9 08/03/2018 1443   MCHC 34.2 08/02/2017 1344   RDW 12.8 08/03/2018 1443   RDW 13.5 07/29/2014 0758   LYMPHSABS 2.1 08/03/2018 1443   LYMPHSABS 1.5 07/29/2014 0758   MONOABS 0.5 07/29/2014 0758   EOSABS 0.4 08/03/2018 1443   EOSABS 0.3 07/29/2014 0758   BASOSABS 0.1 08/03/2018 1443   BASOSABS 0.1 07/29/2014 0758    CMP Latest Ref Rng & Units 08/03/2018 06/29/2018 12/13/2017  Glucose 65 - 99 mg/dL 82 97 91  BUN 6 - 24 mg/dL 10 13 18   Creatinine 0.76 - 1.27 mg/dL 1.91 4.78 2.95  Sodium 134 - 144 mmol/L 140 144 142  Potassium 3.5 - 5.2 mmol/L 4.8 5.0 4.7  Chloride 96 - 106 mmol/L 103 103 103  CO2 20 - 29 mmol/L 24 25 23   Calcium 8.7 - 10.2 mg/dL 9.6 9.5 9.3  Total Protein 6.0 - 8.5 g/dL 6.9 6.7 7.1  Total Bilirubin 0.0 - 1.2 mg/dL 0.4 0.6 0.8  Alkaline Phos 39 - 117 IU/L 96 84 112  AST 0 - 40 IU/L 27 22 25   ALT 0 - 44 IU/L 32 21 29      Microbiology: No results found for this or any previous visit (from the past 240 hour(s)). IMAGING RESULTS: ? Impression/Recommendation ?52 y.o. male with a history of restless leg syndrome, parkinsonian like illness, h/o vasovagal episodes is here to  for a rash- As per patient he has been unwell for a year. He had a shoulder surgery following which ne has been tired, lack of energy and sleeping more. This happened last year. His PCP checked him for EBV and the IgG was very high ( IgM was not elevated) . He says he was told that he had mono. He plays golf and this year in August he had gone to [place] to play golf and when he returned home his wife noted  An  erythematous  lesion on his left foot like a bruise which the patient was not aware of. There was no insect bite. Soon after that  he developed lesions on his back- it started with a round evenly erythematous macule and a few more lesions followed that. ? ?Rash: resolved with steroids. The picture he showed me on his phone looked like either pityriasis roscea VS mycoses fungoides Vs Urticarial vasculitis. Not zoster. Will check RPR, Lyme and HIV If it recurs he will need repeat biopsy  Fatigue, lack of energy for a year-( improving now) is this post viral syndrome VS autoimmune disease- EBV titers reflect prior infection and not an acute infection- igG could be high if he has any other illness with underlying immune activation  Told him if this was a post viral syndrome he will get better in due course.   Exercise, and god sleep will help  Parkinsonian like diesase, restless leg syndrome- followed by neurology ___________________________________________________ Discussed with patient, and his wife in detail. Will inform him of the resuls

## 2018-08-17 LAB — HIV ANTIBODY (ROUTINE TESTING W REFLEX): HIV SCREEN 4TH GENERATION: NONREACTIVE

## 2018-08-17 LAB — RPR: RPR: NONREACTIVE

## 2018-08-20 LAB — B. BURGDORFI ANTIBODIES: B burgdorferi Ab IgG+IgM: <0.91 {ISR} (ref 0.00–0.90)

## 2018-08-21 ENCOUNTER — Telehealth: Payer: Self-pay | Admitting: Licensed Clinical Social Worker

## 2018-08-21 NOTE — Telephone Encounter (Signed)
Left message for patient that Lyme test was negative.

## 2018-08-23 ENCOUNTER — Other Ambulatory Visit: Payer: Self-pay | Admitting: Neurology

## 2018-08-23 DIAGNOSIS — R42 Dizziness and giddiness: Secondary | ICD-10-CM

## 2018-08-27 DIAGNOSIS — L578 Other skin changes due to chronic exposure to nonionizing radiation: Secondary | ICD-10-CM | POA: Diagnosis not present

## 2018-08-27 DIAGNOSIS — Z85828 Personal history of other malignant neoplasm of skin: Secondary | ICD-10-CM | POA: Diagnosis not present

## 2018-08-27 DIAGNOSIS — L570 Actinic keratosis: Secondary | ICD-10-CM | POA: Diagnosis not present

## 2018-08-27 DIAGNOSIS — R21 Rash and other nonspecific skin eruption: Secondary | ICD-10-CM | POA: Diagnosis not present

## 2018-08-27 DIAGNOSIS — Z1283 Encounter for screening for malignant neoplasm of skin: Secondary | ICD-10-CM | POA: Diagnosis not present

## 2018-09-09 ENCOUNTER — Ambulatory Visit
Admission: RE | Admit: 2018-09-09 | Discharge: 2018-09-09 | Disposition: A | Discharge status: Home / Self Care | Payer: BLUE CROSS/BLUE SHIELD | Source: Ambulatory Visit | Attending: Neurology | Admitting: Neurology

## 2018-09-09 DIAGNOSIS — R42 Dizziness and giddiness: Secondary | ICD-10-CM | POA: Insufficient documentation

## 2018-09-09 MED ORDER — GADOBUTROL 1 MMOL/ML IV SOLN
10.0000 mL | Freq: Once | INTRAVENOUS | Status: AC | PRN
  Administered 2018-09-09: 10 mL via INTRAVENOUS

## 2018-09-13 DIAGNOSIS — R1310 Dysphagia, unspecified: Secondary | ICD-10-CM | POA: Diagnosis not present

## 2018-09-13 DIAGNOSIS — H0520 Unspecified exophthalmos: Secondary | ICD-10-CM | POA: Diagnosis not present

## 2018-09-13 DIAGNOSIS — I493 Ventricular premature depolarization: Secondary | ICD-10-CM | POA: Diagnosis not present

## 2018-09-13 DIAGNOSIS — E049 Nontoxic goiter, unspecified: Secondary | ICD-10-CM | POA: Diagnosis not present

## 2018-09-20 DIAGNOSIS — H0520 Unspecified exophthalmos: Secondary | ICD-10-CM | POA: Diagnosis not present

## 2018-09-20 DIAGNOSIS — E049 Nontoxic goiter, unspecified: Secondary | ICD-10-CM | POA: Diagnosis not present

## 2018-09-20 DIAGNOSIS — R1310 Dysphagia, unspecified: Secondary | ICD-10-CM | POA: Diagnosis not present

## 2018-09-24 DIAGNOSIS — E049 Nontoxic goiter, unspecified: Secondary | ICD-10-CM | POA: Diagnosis not present

## 2018-09-24 DIAGNOSIS — R1310 Dysphagia, unspecified: Secondary | ICD-10-CM | POA: Diagnosis not present

## 2018-09-24 DIAGNOSIS — H0520 Unspecified exophthalmos: Secondary | ICD-10-CM | POA: Diagnosis not present

## 2018-09-24 DIAGNOSIS — I493 Ventricular premature depolarization: Secondary | ICD-10-CM | POA: Diagnosis not present

## 2018-09-24 DIAGNOSIS — E041 Nontoxic single thyroid nodule: Secondary | ICD-10-CM | POA: Diagnosis not present

## 2018-09-28 DIAGNOSIS — J0141 Acute recurrent pansinusitis: Secondary | ICD-10-CM | POA: Diagnosis not present

## 2018-10-11 ENCOUNTER — Ambulatory Visit (INDEPENDENT_AMBULATORY_CARE_PROVIDER_SITE_OTHER): Payer: BLUE CROSS/BLUE SHIELD | Admitting: Family Medicine

## 2018-10-11 ENCOUNTER — Encounter: Payer: Self-pay | Admitting: Family Medicine

## 2018-10-11 VITALS — BP 124/84 | HR 88 | Temp 98.0°F | Wt 214.2 lb

## 2018-10-11 DIAGNOSIS — F4322 Adjustment disorder with anxiety: Secondary | ICD-10-CM | POA: Diagnosis not present

## 2018-10-11 DIAGNOSIS — R1310 Dysphagia, unspecified: Secondary | ICD-10-CM | POA: Diagnosis not present

## 2018-10-11 DIAGNOSIS — E049 Nontoxic goiter, unspecified: Secondary | ICD-10-CM | POA: Diagnosis not present

## 2018-10-11 DIAGNOSIS — H0520 Unspecified exophthalmos: Secondary | ICD-10-CM | POA: Diagnosis not present

## 2018-10-11 DIAGNOSIS — Z0000 Encounter for general adult medical examination without abnormal findings: Secondary | ICD-10-CM

## 2018-10-11 DIAGNOSIS — R109 Unspecified abdominal pain: Secondary | ICD-10-CM | POA: Diagnosis not present

## 2018-10-11 DIAGNOSIS — R002 Palpitations: Secondary | ICD-10-CM

## 2018-10-11 DIAGNOSIS — I493 Ventricular premature depolarization: Secondary | ICD-10-CM | POA: Diagnosis not present

## 2018-10-11 LAB — POCT URINALYSIS DIPSTICK
Bilirubin, UA: NEGATIVE
Glucose, UA: NEGATIVE
KETONES UA: NEGATIVE
Leukocytes, UA: NEGATIVE
Nitrite, UA: NEGATIVE
Protein, UA: NEGATIVE
Spec Grav, UA: 1.01 (ref 1.010–1.025)
Urobilinogen, UA: 0.2 E.U./dL
pH, UA: 7.5 (ref 5.0–8.0)

## 2018-10-11 NOTE — Progress Notes (Signed)
Patient: Kurt PrestoRichard T Maldonado, Male    DOB: June 21, 1966, 53 y.o.   MRN: [id number] Visit Date: 10/11/2018  Today's Provider: Megan Mansichard Hobie Kohles Jr, MD   Chief Complaint  Patient presents with  . Annual Exam   Subjective:    Annual physical exam Kurt Maldonado is a 53 y.o. male who presents today for health maintenance and complete physical. He feels well. He reports exercising none. He reports he is sleeping poorly.  -----------------------------------------------------------------   Review of Systems  Constitutional: Positive for appetite change.       He has had a lot of recent stress and has had decreased appetite and has had some palpitations with this.  HENT: Positive for congestion and ear pain.   Eyes: Negative.   Respiratory: Negative.   Cardiovascular: Positive for palpitations.  Gastrointestinal: Positive for abdominal pain.       He has had nonspecific abdominal pain associated with his recent life stress.  Endocrine: Negative.   Genitourinary: Negative.   Musculoskeletal: Negative.   Skin: Negative.   Allergic/Immunologic: Negative.   Neurological: Positive for headaches.  Hematological: Negative.   Psychiatric/Behavioral: Negative.     Social History He  reports that he has never smoked. He has never used smokeless tobacco. He reports current alcohol use. He reports that he does not use drugs. Social History   Socioeconomic History  . Marital status: Married    Spouse name: Not on file  . Number of children: Not on file  . Years of education: Not on file  . Highest education level: Not on file  Occupational History  . Not on file  Social Needs  . Financial resource strain: Not on file  . Food insecurity:    Worry: Not on file    Inability: Not on file  . Transportation needs:    Medical: Not on file    Non-medical: Not on file  Tobacco Use  . Smoking status: Never Smoker  . Smokeless tobacco: Never Used  Substance and Sexual Activity  .  Alcohol use: Yes    Comment: rare- 2 beers per month   . Drug use: No  . Sexual activity: Not on file  Lifestyle  . Physical activity:    Days per week: Not on file    Minutes per session: Not on file  . Stress: Not on file  Relationships  . Social connections:    Talks on phone: Not on file    Gets together: Not on file    Attends religious service: Not on file    Active member of club or organization: Not on file    Attends meetings of clubs or organizations: Not on file    Relationship status: Not on file  Other Topics Concern  . Not on file  Social History Narrative  . Not on file    Patient Active Problem List   Diagnosis Date Noted  . Insomnia 04/04/2016  . Parkinson's disease (HCC) 08/20/2014  . Benign fibroma of prostate 07/31/2014  . Clinical depression 07/31/2014  . Acid reflux 07/31/2014  . Calculus of kidney 07/31/2014  . Headache, migraine 07/31/2014  . Neurocardiogenic syncope 07/31/2014  . Gastro-esophageal reflux disease without esophagitis 05/14/2014  . Bleeding per rectum 05/14/2014  . Beat, premature ventricular 04/08/2014  . Atrial fibrillation and flutter (HCC) 04/08/2014  . BP (high blood pressure) 03/25/2014  . Awareness of heartbeats 03/25/2014  . NS (nuclear sclerosis) 02/05/2014  . Atelectasis 08/04/1983    Past  Surgical History:  Procedure Laterality Date  . APPENDECTOMY    . CHOLECYSTECTOMY N/A 08/23/2017   Procedure: LAPAROSCOPIC CHOLECYSTECTOMY WITH INTRAOPERATIVE CHOLANGIOGRAM;  Surgeon: Nadeen Landau, MD;  Location: [facility];  Service: General;  Laterality: N/A;  . COLONOSCOPY  05/27/2014   adenomatous polyps and FH colon polyp-repeat 5 years per Dr Shelle Iron  . ESOPHAGOGASTRODUODENOSCOPY  05/27/2014   Esophagitis-no repeat per Dr Shelle Iron   . ESOPHAGOGASTRODUODENOSCOPY N/A 06/28/2017   Procedure: ESOPHAGOGASTRODUODENOSCOPY (EGD);  Surgeon: Toledo, Boykin Nearing, MD;  Location: [facility];  Service: Gastroenterology;  Laterality: N/A;    . PROSTATE BIOPSY N/A 09/08/2014   Procedure: BIOPSY TRANSRECTAL ULTRASONIC PROSTATE (TUBP);  Surgeon: Heloise Purpura, MD;  Location: [facility];  Service: Urology;  Laterality: N/A;  . SHOULDER SURGERY     bilateral     Family History  Family Status  Relation Name Status  . Mother  Deceased at age 67  . Father  Alive  . Sister  Alive  . Brother  Alive  . Daughter  Alive  . Sister  Alive  . Daughter  Alive  . Other  (Not Specified)  . PGM  (Not Specified)  . Neg Hx  (Not Specified)   His family history includes Alzheimer's disease in his mother; Arthritis in his mother; Cataracts in his mother; Colon polyps in his father; Dementia in his mother; Diabetes Mellitus II in his father; Luiz Blare' disease in an other family member; High blood pressure in his mother; Hypertension in his father; Kidney cancer in his father; Migraines in his mother; Osteoporosis in his mother; Parkinson's disease in his paternal grandmother; Prostate cancer in his father; Tremor in his paternal grandmother.     Allergies  Allergen Reactions  . Codeine Nausea And Vomiting and Other (See Comments)    Muscle tightness     Previous Medications   ASCORBIC ACID (VITAMIN C) 1000 MG TABLET    Take by mouth.   CHOLECALCIFEROL (VITAMIN D) 1000 UNITS TABLET    Take 5,000 Units by mouth daily. Take 5,000 units daily   CHOLESTYRAMINE (QUESTRAN) 4 GM/DOSE POWDER    Take 0.5 packets (2 g total) by mouth 3 (three) times daily with meals.   CHOLESTYRAMINE LIGHT (PREVALITE) 4 G PACKET    Take 1 packet (4 g total) by mouth 2 (two) times daily.   CYANOCOBALAMIN (RA VITAMIN B-12 TR) 1000 MCG TBCR    Take by mouth.   DIAZEPAM (VALIUM) 5 MG TABLET    TAKE 1/2-1 TAB BY MOUTH EVERY 6HRS AS NEEDED FOR SPASMS/SLEEP   FLUTICASONE (FLONASE) 50 MCG/ACT NASAL SPRAY    Instill 2 sprays in each nostril once daily.   MECLIZINE (ANTIVERT) 25 MG TABLET    Take 1 tablet (25 mg total) by mouth 3 (three) times daily as needed for dizziness.    OMEPRAZOLE (PRILOSEC) 40 MG CAPSULE    Take 1 capsule (40 mg total) by mouth daily.   OXYMETAZOLINE (AFRIN) 0.05 % NASAL SPRAY    Place 2 sprays into both nostrils 2 (two) times daily as needed for congestion.   PREDNISONE (STERAPRED UNI-PAK 48 TAB) 10 MG (48) TBPK TABLET    Taper as directed.   PROMETHAZINE (PHENERGAN) 25 MG TABLET    Take 1 tablet (25 mg total) by mouth every 6 (six) hours as needed for nausea or vomiting.   ROPINIROLE (REQUIP) 0.5 MG TABLET    Take 0.5 mg by mouth at bedtime.   TRIAMCINOLONE CREAM (KENALOG) 0.1 %    Apply  1 application topically 2 (two) times daily.   VALACYCLOVIR (VALTREX) 1000 MG TABLET    Take 1 tablet (1,000 mg total) 3 (three) times daily by mouth.   ZOLPIDEM (AMBIEN) 10 MG TABLET    TAKE 1 TABLET BY MOUTH AT BEDTIME AS NEEDED FOR SLEEP    Patient Care Team: [name], MD as PCP - General (Family Medicine)      Objective:   Vitals: BP 124/84 (BP Location: Right Arm, Patient Position: Sitting, Cuff Size: Normal)   Pulse 88   Temp 98 F (36.7 C) (Oral)   Wt 214 lb 3.2 oz (97.2 kg)   SpO2 98%   BMI 27.50 kg/m    Physical Exam Constitutional:      Appearance: Normal appearance. He is well-developed and normal weight.  HENT:     Head: Normocephalic and atraumatic.     Right Ear: External ear normal.     Left Ear: External ear normal.     Nose: Nose normal.  Eyes:     General: No scleral icterus.    Conjunctiva/sclera: Conjunctivae normal.  Neck:     Thyroid: No thyromegaly.  Cardiovascular:     Rate and Rhythm: Normal rate and regular rhythm.     Heart sounds: Normal heart sounds.  Pulmonary:     Effort: Pulmonary effort is normal.     Breath sounds: Normal breath sounds.  Abdominal:     Palpations: Abdomen is soft.  Genitourinary:    Penis: Normal.   Lymphadenopathy:     Cervical: No cervical adenopathy.  Skin:    General: Skin is warm and dry.  Neurological:     General: No focal deficit present.     Mental  Status: He is alert and oriented to person, place, and time. Mental status is at baseline.  Psychiatric:        Mood and Affect: Mood normal.        Behavior: Behavior normal.        Thought Content: Thought content normal.        Judgment: Judgment normal.   ECG sinus rhythm with a no ST/T wave changes.   Depression Screen PHQ 2/9 Scores 07/31/2018 08/01/2017 04/28/2016  PHQ - 2 Score 0 4 0  PHQ- 9 Score - 10 -      Assessment & Plan:  IDara Lords, CMA, am acting as a scribe for Megan Mans., MD.     Routine Health Maintenance and Physical Exam  Exercise Activities and Dietary recommendations Goals   None     Immunization History  Administered Date(s) Administered  . Influenza,inj,Quad PF,6+ Mos 09/14/2016  . Influenza,inj,quad, With Preservative 10/20/2017  . Influenza-Unspecified 07/16/2018    Health Maintenance  Topic Date Due  . TETANUS/TDAP  12/28/1983  . COLONOSCOPY  12/28/2014  . INFLUENZA VACCINE  Completed  . HIV Screening  Completed     Discussed health benefits of physical activity, and encouraged him to engage in regular exercise appropriate for his age and condition.  1. Annual physical exam  - CBC with Differential/Platelet - Comprehensive metabolic panel - Hemoglobin A1c - Lipid panel - PSA - TSH - POCT urinalysis dipstick  2. Palpitation I think palpitations and abdominal pain are stress related.  He is having some marital stress and I think this will resolve as that takes care of itself. - EKG 12-Lead  3. Laboratory examination ordered as part of a complete physical examination   4. Abdominal pain, unspecified abdominal  location  - Sedimentation rate    -------------------------------------------------------------------- I have done the exam and reviewed the chart and it is accurate to the best of my knowledge. [employer]  technology has been used and  any errors in dictation or transcription are unintentional. Julieanne Mansonichard  Dashea Mcmullan M.D. [facility]

## 2018-10-12 ENCOUNTER — Telehealth: Payer: Self-pay

## 2018-10-12 MED ORDER — ALPRAZOLAM 1 MG PO TABS: 1.0000 mg | ORAL_TABLET | Freq: Two times a day (BID) | ORAL | 3 refills | Status: DC | PRN

## 2018-10-12 NOTE — Telephone Encounter (Signed)
Patient reports he is still waiting on Ambien and Xanax to be called into [facility]. Patient reports Dr. Sullivan Lone was suppose to send them yesterday while patient was here for CPE.

## 2018-10-17 ENCOUNTER — Encounter: Payer: Self-pay | Admitting: Family Medicine

## 2018-11-01 DIAGNOSIS — Z6827 Body mass index (BMI) 27.0-27.9, adult: Secondary | ICD-10-CM | POA: Diagnosis not present

## 2018-11-01 DIAGNOSIS — H9313 Tinnitus, bilateral: Secondary | ICD-10-CM | POA: Diagnosis not present

## 2018-11-01 DIAGNOSIS — Z01118 Encounter for examination of ears and hearing with other abnormal findings: Secondary | ICD-10-CM | POA: Diagnosis not present

## 2018-11-01 DIAGNOSIS — R42 Dizziness and giddiness: Secondary | ICD-10-CM | POA: Diagnosis not present

## 2018-11-01 DIAGNOSIS — H903 Sensorineural hearing loss, bilateral: Secondary | ICD-10-CM | POA: Diagnosis not present

## 2018-11-07 DIAGNOSIS — F4322 Adjustment disorder with anxiety: Secondary | ICD-10-CM | POA: Diagnosis not present

## 2018-11-13 DIAGNOSIS — F4322 Adjustment disorder with anxiety: Secondary | ICD-10-CM | POA: Diagnosis not present

## 2018-11-20 DIAGNOSIS — F4322 Adjustment disorder with anxiety: Secondary | ICD-10-CM | POA: Diagnosis not present

## 2018-11-26 DIAGNOSIS — F4322 Adjustment disorder with anxiety: Secondary | ICD-10-CM | POA: Diagnosis not present

## 2018-12-03 ENCOUNTER — Encounter: Payer: Self-pay | Admitting: Family Medicine

## 2018-12-03 ENCOUNTER — Other Ambulatory Visit: Payer: Self-pay

## 2018-12-03 ENCOUNTER — Ambulatory Visit (INDEPENDENT_AMBULATORY_CARE_PROVIDER_SITE_OTHER): Payer: BLUE CROSS/BLUE SHIELD | Admitting: Family Medicine

## 2018-12-03 ENCOUNTER — Telehealth: Payer: Self-pay | Admitting: Family Medicine

## 2018-12-03 VITALS — BP 137/88 | HR 86 | Temp 98.8°F | Ht 74.0 in | Wt 207.2 lb

## 2018-12-03 DIAGNOSIS — F419 Anxiety disorder, unspecified: Secondary | ICD-10-CM | POA: Diagnosis not present

## 2018-12-03 DIAGNOSIS — F329 Major depressive disorder, single episode, unspecified: Secondary | ICD-10-CM | POA: Diagnosis not present

## 2018-12-03 DIAGNOSIS — R634 Abnormal weight loss: Secondary | ICD-10-CM

## 2018-12-03 DIAGNOSIS — F4322 Adjustment disorder with anxiety: Secondary | ICD-10-CM | POA: Diagnosis not present

## 2018-12-03 DIAGNOSIS — K219 Gastro-esophageal reflux disease without esophagitis: Secondary | ICD-10-CM

## 2018-12-03 MED ORDER — LORAZEPAM 1 MG PO TABS: 1.0000 mg | ORAL_TABLET | Freq: Three times a day (TID) | ORAL | 4 refills | Status: DC | PRN

## 2018-12-03 MED ORDER — PANTOPRAZOLE SODIUM 40 MG PO TBEC: 40.0000 mg | DELAYED_RELEASE_TABLET | Freq: Two times a day (BID) | ORAL | 3 refills | Status: DC

## 2018-12-03 NOTE — Progress Notes (Signed)
Patient: Kurt Maldonado Male    DOB: 1966-02-25   53 y.o.   MRN: [id number] Visit Date: 12/03/2018  Today's Provider: Megan Mans, MD   Chief Complaint  Patient presents with  . Tremors    severe and all over.  feels it may be stress or anxiety   Subjective:     HPI  Pt reports he is having tremors all over and feel it may be from stress or anxiety.  He is overwhelmed emotionally.  He and his wife are separated and he does not wish for this to be the case.  She is also starting to date an old boyfriend of hers.  This is devastating him and he cannot stay asleep at night as he wakes up thinking about the situation.  He is not suicidal or homicidal.  He is not angry.  He has no appetite and is unable to eat.  He has trouble focusing at work.  He has lost weight but this is not unexpected as is he has not been eating.  Allergies  Allergen Reactions  . Codeine Nausea And Vomiting and Other (See Comments)    Muscle tightness      Current Outpatient Medications:  .  ALPRAZolam (XANAX) 1 MG tablet, Take 1 tablet (1 mg total) by mouth 2 (two) times daily as needed for anxiety., Disp: 55 tablet, Rfl: 3 .  diazepam (VALIUM) 5 MG tablet, TAKE 1/2-1 TAB BY MOUTH EVERY 6HRS AS NEEDED FOR SPASMS/SLEEP, Disp: 30 tablet, Rfl: 5 .  omeprazole (PRILOSEC) 40 MG capsule, Take 1 capsule (40 mg total) by mouth daily., Disp: 90 capsule, Rfl: 3 .  oxymetazoline (AFRIN) 0.05 % nasal spray, Place 2 sprays into both nostrils 2 (two) times daily as needed for congestion., Disp: , Rfl:  .  promethazine (PHENERGAN) 25 MG tablet, Take 1 tablet (25 mg total) by mouth every 6 (six) hours as needed for nausea or vomiting., Disp: 60 tablet, Rfl: 1 .  triamcinolone cream (KENALOG) 0.1 %, Apply 1 application topically 2 (two) times daily., Disp: 30 g, Rfl: 0 .  valACYclovir (VALTREX) 1000 MG tablet, Take 1 tablet (1,000 mg total) 3 (three) times daily by mouth., Disp: 21 tablet, Rfl: 0 .  zolpidem  (AMBIEN) 10 MG tablet, TAKE 1 TABLET BY MOUTH AT BEDTIME AS NEEDED FOR SLEEP, Disp: 15 tablet, Rfl: 5 .  Ascorbic Acid (VITAMIN C) 1000 MG tablet, Take by mouth., Disp: , Rfl:  .  cholecalciferol (VITAMIN D) 1000 units tablet, Take 5,000 Units by mouth daily. Take 5,000 units daily, Disp: , Rfl:  .  Cyanocobalamin (RA VITAMIN B-12 TR) 1000 MCG TBCR, Take by mouth., Disp: , Rfl:  .  rOPINIRole (REQUIP) 0.5 MG tablet, Take 0.5 mg by mouth at bedtime., Disp: , Rfl:   Review of Systems  Constitutional: Negative.   HENT: Negative.   Eyes: Negative.   Respiratory: Negative.   Cardiovascular: Negative.   Gastrointestinal: Negative.   Endocrine: Negative.   Genitourinary: Negative.   Musculoskeletal: Negative.   Skin: Negative.   Allergic/Immunologic: Negative.   Neurological: Positive for tremors.  Hematological: Negative.   Psychiatric/Behavioral: Negative.     Social History   Tobacco Use  . Smoking status: Never Smoker  . Smokeless tobacco: Never Used  Substance Use Topics  . Alcohol use: Yes    Comment: rare- 2 beers per month       Objective:   BP 137/88 (BP Location: Right Arm, Patient  Position: Sitting, Cuff Size: Normal)   Pulse 86   Temp 98.8 F (37.1 C) (Oral)   Ht 6\' 2"  (1.88 m)   Wt 207 lb 3.2 oz (94 kg)   BMI 26.60 kg/m  Vitals:   12/03/18 1150  BP: 137/88  Pulse: 86  Temp: 98.8 F (37.1 C)  TempSrc: Oral  Weight: 207 lb 3.2 oz (94 kg)  Height: 6\' 2"  (1.88 m)     Physical Exam Vitals signs reviewed.  Constitutional:      Appearance: He is normal weight.  HENT:     Head: Normocephalic and atraumatic.     Right Ear: External ear normal.     Left Ear: External ear normal.     Nose: Nose normal.  Eyes:     Conjunctiva/sclera: Conjunctivae normal.  Cardiovascular:     Rate and Rhythm: Normal rate and regular rhythm.     Heart sounds: Normal heart sounds.  Pulmonary:     Effort: Pulmonary effort is normal.     Breath sounds: Normal breath sounds.   Abdominal:     Palpations: Abdomen is soft.     Comments: Mild epigastric tenderness without guarding or rebound.  Skin:    General: Skin is warm and dry.  Neurological:     General: No focal deficit present.     Mental Status: He is alert and oriented to person, place, and time. Mental status is at baseline.  Psychiatric:        Behavior: Behavior normal.        Thought Content: Thought content normal.        Judgment: Judgment normal.         Assessment & Plan    1. Gastroesophageal reflux disease, esophagitis presence not specified At this time stop Prilosec and go to pantoprazole twice daily.  Almost 1 hour spent on this visit.  Most of it in counseling. - pantoprazole (PROTONIX) 40 MG tablet; Take 1 tablet (40 mg total) by mouth 2 (two) times daily.  Dispense: 60 tablet; Refill: 3  2. Anxiety Change Xanax to Lorazepam 1 mg every 8 hours as needed at this may help more as it is last longer. - LORazepam (ATIVAN) 1 MG tablet; Take 1 tablet (1 mg total) by mouth every 8 (eight) hours as needed for anxiety.  Dispense: 90 tablet; Refill: 4  3. Weight loss He has lost 14 pounds since Thanksgiving.  30 pounds in the last year.  I think all the weight loss is Blanca Friend is been due to the marital stress he is under.  4. Reactive depression Considered adding Remeron tonight but will let him see his counselor Karmen Bongo tomorrow.  Again, no suicidal or homicidal ideation.  I do think he needs time and to work through this situation.    I have done the exam and reviewed the above chart and it is accurate to the best of my knowledge. Dentist has been used in this note in any air is in the dictation or transcription are unintentional.  Megan Mans, MD  [facility]

## 2018-12-03 NOTE — Telephone Encounter (Signed)
Pt calling about new medication that was supposed to be called in from today's visit. It was not called into: [id number] [place], [place] - [address] [phone] (Phone) [phone] (Fax)   Needing to pick up.  Thanks, [employer]

## 2018-12-05 ENCOUNTER — Ambulatory Visit: Payer: BLUE CROSS/BLUE SHIELD | Admitting: Family Medicine

## 2018-12-12 DIAGNOSIS — F4322 Adjustment disorder with anxiety: Secondary | ICD-10-CM | POA: Diagnosis not present

## 2018-12-19 DIAGNOSIS — F4322 Adjustment disorder with anxiety: Secondary | ICD-10-CM | POA: Diagnosis not present

## 2018-12-20 ENCOUNTER — Other Ambulatory Visit: Payer: Self-pay | Admitting: Family Medicine

## 2018-12-20 MED ORDER — SERTRALINE HCL 100 MG PO TABS: 100.0000 mg | ORAL_TABLET | Freq: Every day | ORAL | 5 refills | Status: DC

## 2018-12-20 NOTE — Progress Notes (Signed)
Phone call.  Patient not suicidal or homicidal but more depressed.  Having trouble sleeping at night.  After discussion with him will increase sertraline from 100 to 200 mg daily recheck in 2 to 3 weeks.  May consider changing to Paxil or another medication.  Seeing Karmen Bongo for counseling 1-2 times a week.

## 2018-12-26 DIAGNOSIS — F4322 Adjustment disorder with anxiety: Secondary | ICD-10-CM | POA: Diagnosis not present

## 2019-01-04 DIAGNOSIS — F4322 Adjustment disorder with anxiety: Secondary | ICD-10-CM | POA: Diagnosis not present

## 2019-01-10 DIAGNOSIS — F4322 Adjustment disorder with anxiety: Secondary | ICD-10-CM | POA: Diagnosis not present

## 2019-01-13 ENCOUNTER — Other Ambulatory Visit: Payer: Self-pay | Admitting: Family Medicine

## 2019-01-23 DIAGNOSIS — F4322 Adjustment disorder with anxiety: Secondary | ICD-10-CM | POA: Diagnosis not present

## 2019-01-30 ENCOUNTER — Other Ambulatory Visit: Payer: Self-pay | Admitting: Family Medicine

## 2019-01-30 DIAGNOSIS — F4322 Adjustment disorder with anxiety: Secondary | ICD-10-CM | POA: Diagnosis not present

## 2019-01-30 DIAGNOSIS — G4700 Insomnia, unspecified: Secondary | ICD-10-CM

## 2019-01-30 DIAGNOSIS — I4891 Unspecified atrial fibrillation: Secondary | ICD-10-CM | POA: Diagnosis not present

## 2019-01-30 DIAGNOSIS — R55 Syncope and collapse: Secondary | ICD-10-CM | POA: Diagnosis not present

## 2019-01-30 DIAGNOSIS — I10 Essential (primary) hypertension: Secondary | ICD-10-CM | POA: Diagnosis not present

## 2019-01-30 DIAGNOSIS — I493 Ventricular premature depolarization: Secondary | ICD-10-CM | POA: Diagnosis not present

## 2019-02-05 DIAGNOSIS — R197 Diarrhea, unspecified: Secondary | ICD-10-CM | POA: Diagnosis not present

## 2019-02-05 DIAGNOSIS — Z9049 Acquired absence of other specified parts of digestive tract: Secondary | ICD-10-CM | POA: Diagnosis not present

## 2019-02-07 DIAGNOSIS — F4322 Adjustment disorder with anxiety: Secondary | ICD-10-CM | POA: Diagnosis not present

## 2019-02-12 ENCOUNTER — Other Ambulatory Visit: Payer: Self-pay | Admitting: Family Medicine

## 2019-02-12 MED ORDER — TEMAZEPAM 30 MG PO CAPS: 30.0000 mg | ORAL_CAPSULE | Freq: Every evening | ORAL | 1 refills | Status: DC | PRN

## 2019-02-12 NOTE — Progress Notes (Signed)
For insomnia

## 2019-02-13 DIAGNOSIS — F4322 Adjustment disorder with anxiety: Secondary | ICD-10-CM | POA: Diagnosis not present

## 2019-02-21 DIAGNOSIS — F4322 Adjustment disorder with anxiety: Secondary | ICD-10-CM | POA: Diagnosis not present

## 2019-02-27 DIAGNOSIS — F4322 Adjustment disorder with anxiety: Secondary | ICD-10-CM | POA: Diagnosis not present

## 2019-02-27 DIAGNOSIS — Z1159 Encounter for screening for other viral diseases: Secondary | ICD-10-CM | POA: Diagnosis not present

## 2019-03-04 DIAGNOSIS — K5730 Diverticulosis of large intestine without perforation or abscess without bleeding: Secondary | ICD-10-CM | POA: Diagnosis not present

## 2019-03-04 DIAGNOSIS — Z86010 Personal history of colonic polyps: Secondary | ICD-10-CM | POA: Diagnosis not present

## 2019-03-04 DIAGNOSIS — K640 First degree hemorrhoids: Secondary | ICD-10-CM | POA: Diagnosis not present

## 2019-03-04 DIAGNOSIS — D126 Benign neoplasm of colon, unspecified: Secondary | ICD-10-CM | POA: Diagnosis not present

## 2019-03-06 DIAGNOSIS — F4322 Adjustment disorder with anxiety: Secondary | ICD-10-CM | POA: Diagnosis not present

## 2019-03-14 DIAGNOSIS — F4322 Adjustment disorder with anxiety: Secondary | ICD-10-CM | POA: Diagnosis not present

## 2019-03-20 DIAGNOSIS — F4322 Adjustment disorder with anxiety: Secondary | ICD-10-CM | POA: Diagnosis not present

## 2019-03-27 DIAGNOSIS — F4322 Adjustment disorder with anxiety: Secondary | ICD-10-CM | POA: Diagnosis not present

## 2019-04-04 DIAGNOSIS — F4322 Adjustment disorder with anxiety: Secondary | ICD-10-CM | POA: Diagnosis not present

## 2019-04-12 DIAGNOSIS — F4322 Adjustment disorder with anxiety: Secondary | ICD-10-CM | POA: Diagnosis not present

## 2019-04-15 DIAGNOSIS — R1310 Dysphagia, unspecified: Secondary | ICD-10-CM | POA: Diagnosis not present

## 2019-04-15 DIAGNOSIS — G2581 Restless legs syndrome: Secondary | ICD-10-CM | POA: Diagnosis not present

## 2019-04-15 DIAGNOSIS — E049 Nontoxic goiter, unspecified: Secondary | ICD-10-CM | POA: Diagnosis not present

## 2019-04-15 DIAGNOSIS — F4322 Adjustment disorder with anxiety: Secondary | ICD-10-CM | POA: Diagnosis not present

## 2019-04-15 DIAGNOSIS — H0520 Unspecified exophthalmos: Secondary | ICD-10-CM | POA: Diagnosis not present

## 2019-04-15 DIAGNOSIS — I493 Ventricular premature depolarization: Secondary | ICD-10-CM | POA: Diagnosis not present

## 2019-04-18 DIAGNOSIS — E049 Nontoxic goiter, unspecified: Secondary | ICD-10-CM | POA: Diagnosis not present

## 2019-04-18 DIAGNOSIS — I493 Ventricular premature depolarization: Secondary | ICD-10-CM | POA: Diagnosis not present

## 2019-04-18 DIAGNOSIS — R1310 Dysphagia, unspecified: Secondary | ICD-10-CM | POA: Diagnosis not present

## 2019-04-18 DIAGNOSIS — H0520 Unspecified exophthalmos: Secondary | ICD-10-CM | POA: Diagnosis not present

## 2019-04-19 DIAGNOSIS — F4322 Adjustment disorder with anxiety: Secondary | ICD-10-CM | POA: Diagnosis not present

## 2019-04-25 DIAGNOSIS — F4322 Adjustment disorder with anxiety: Secondary | ICD-10-CM | POA: Diagnosis not present

## 2019-05-02 ENCOUNTER — Other Ambulatory Visit (INDEPENDENT_AMBULATORY_CARE_PROVIDER_SITE_OTHER): Payer: BLUE CROSS/BLUE SHIELD | Admitting: Family Medicine

## 2019-05-02 DIAGNOSIS — F4322 Adjustment disorder with anxiety: Secondary | ICD-10-CM | POA: Diagnosis not present

## 2019-05-02 DIAGNOSIS — F329 Major depressive disorder, single episode, unspecified: Secondary | ICD-10-CM

## 2019-05-02 MED ORDER — BUPROPION HCL ER (XL) 150 MG PO TB24: 150.0000 mg | ORAL_TABLET | Freq: Every day | ORAL | 12 refills | Status: DC

## 2019-05-02 NOTE — Progress Notes (Signed)
Depression

## 2019-05-14 DIAGNOSIS — F4322 Adjustment disorder with anxiety: Secondary | ICD-10-CM | POA: Diagnosis not present

## 2019-05-21 DIAGNOSIS — F4322 Adjustment disorder with anxiety: Secondary | ICD-10-CM | POA: Diagnosis not present

## 2019-05-25 ENCOUNTER — Other Ambulatory Visit: Payer: Self-pay | Admitting: Family Medicine

## 2019-05-29 DIAGNOSIS — F4322 Adjustment disorder with anxiety: Secondary | ICD-10-CM | POA: Diagnosis not present

## 2019-06-05 ENCOUNTER — Other Ambulatory Visit: Payer: Self-pay | Admitting: Family Medicine

## 2019-06-05 DIAGNOSIS — F419 Anxiety disorder, unspecified: Secondary | ICD-10-CM

## 2019-06-18 DIAGNOSIS — F4322 Adjustment disorder with anxiety: Secondary | ICD-10-CM | POA: Diagnosis not present

## 2019-06-25 ENCOUNTER — Other Ambulatory Visit: Payer: Self-pay | Admitting: Family Medicine

## 2019-06-25 DIAGNOSIS — J329 Chronic sinusitis, unspecified: Secondary | ICD-10-CM

## 2019-06-25 MED ORDER — AMOXICILLIN-POT CLAVULANATE 875-125 MG PO TABS: 1.0000 | ORAL_TABLET | Freq: Two times a day (BID) | ORAL | 1 refills | Status: DC

## 2019-06-26 DIAGNOSIS — F4322 Adjustment disorder with anxiety: Secondary | ICD-10-CM | POA: Diagnosis not present

## 2019-07-02 DIAGNOSIS — F4322 Adjustment disorder with anxiety: Secondary | ICD-10-CM | POA: Diagnosis not present

## 2019-07-09 DIAGNOSIS — F4322 Adjustment disorder with anxiety: Secondary | ICD-10-CM | POA: Diagnosis not present

## 2019-07-11 ENCOUNTER — Other Ambulatory Visit: Payer: Self-pay | Admitting: Family Medicine

## 2019-07-11 DIAGNOSIS — J329 Chronic sinusitis, unspecified: Secondary | ICD-10-CM

## 2019-07-11 MED ORDER — DOXYCYCLINE HYCLATE 100 MG PO TABS: 100.0000 mg | ORAL_TABLET | Freq: Two times a day (BID) | ORAL | 0 refills | Status: DC

## 2019-07-18 DIAGNOSIS — F4322 Adjustment disorder with anxiety: Secondary | ICD-10-CM | POA: Diagnosis not present

## 2019-07-21 ENCOUNTER — Other Ambulatory Visit: Payer: Self-pay | Admitting: Family Medicine

## 2019-07-22 NOTE — Telephone Encounter (Signed)
Pharmacy requesting refills. Thanks!  

## 2019-07-29 DIAGNOSIS — F4322 Adjustment disorder with anxiety: Secondary | ICD-10-CM | POA: Diagnosis not present

## 2019-08-01 DIAGNOSIS — I493 Ventricular premature depolarization: Secondary | ICD-10-CM | POA: Diagnosis not present

## 2019-08-01 DIAGNOSIS — I10 Essential (primary) hypertension: Secondary | ICD-10-CM | POA: Diagnosis not present

## 2019-08-05 DIAGNOSIS — J0101 Acute recurrent maxillary sinusitis: Secondary | ICD-10-CM | POA: Diagnosis not present

## 2019-08-05 DIAGNOSIS — Z20828 Contact with and (suspected) exposure to other viral communicable diseases: Secondary | ICD-10-CM | POA: Diagnosis not present

## 2019-08-08 DIAGNOSIS — F4322 Adjustment disorder with anxiety: Secondary | ICD-10-CM | POA: Diagnosis not present

## 2019-08-16 DIAGNOSIS — F4322 Adjustment disorder with anxiety: Secondary | ICD-10-CM | POA: Diagnosis not present

## 2019-08-17 ENCOUNTER — Other Ambulatory Visit: Payer: Self-pay | Admitting: Family Medicine

## 2019-08-17 DIAGNOSIS — G4700 Insomnia, unspecified: Secondary | ICD-10-CM

## 2019-08-20 ENCOUNTER — Encounter: Payer: Self-pay | Admitting: Family Medicine

## 2019-08-20 ENCOUNTER — Other Ambulatory Visit: Payer: Self-pay

## 2019-08-20 ENCOUNTER — Ambulatory Visit (INDEPENDENT_AMBULATORY_CARE_PROVIDER_SITE_OTHER): Payer: BC Managed Care – PPO | Admitting: Family Medicine

## 2019-08-20 VITALS — BP 116/79 | HR 70 | Temp 96.9°F | Resp 16 | Ht 74.0 in | Wt 204.0 lb

## 2019-08-20 DIAGNOSIS — J301 Allergic rhinitis due to pollen: Secondary | ICD-10-CM | POA: Diagnosis not present

## 2019-08-20 DIAGNOSIS — S39012A Strain of muscle, fascia and tendon of lower back, initial encounter: Secondary | ICD-10-CM | POA: Diagnosis not present

## 2019-08-20 DIAGNOSIS — J329 Chronic sinusitis, unspecified: Secondary | ICD-10-CM | POA: Diagnosis not present

## 2019-08-20 DIAGNOSIS — M5417 Radiculopathy, lumbosacral region: Secondary | ICD-10-CM | POA: Diagnosis not present

## 2019-08-20 DIAGNOSIS — M5418 Radiculopathy, sacral and sacrococcygeal region: Secondary | ICD-10-CM | POA: Diagnosis not present

## 2019-08-20 DIAGNOSIS — F329 Major depressive disorder, single episode, unspecified: Secondary | ICD-10-CM | POA: Diagnosis not present

## 2019-08-20 DIAGNOSIS — M545 Low back pain: Secondary | ICD-10-CM | POA: Diagnosis not present

## 2019-08-20 DIAGNOSIS — M9903 Segmental and somatic dysfunction of lumbar region: Secondary | ICD-10-CM | POA: Diagnosis not present

## 2019-08-20 MED ORDER — CYCLOBENZAPRINE HCL 10 MG PO TABS: 10.0000 mg | ORAL_TABLET | Freq: Three times a day (TID) | ORAL | 1 refills | Status: DC | PRN

## 2019-08-20 MED ORDER — CELECOXIB 200 MG PO CAPS: 200.0000 mg | ORAL_CAPSULE | Freq: Two times a day (BID) | ORAL | 1 refills | Status: DC | PRN

## 2019-08-20 MED ORDER — BUPROPION HCL ER (XL) 300 MG PO TB24: 300.0000 mg | ORAL_TABLET | Freq: Every day | ORAL | 11 refills | Status: DC

## 2019-08-20 NOTE — Progress Notes (Signed)
Patient: Kurt Maldonado Male    DOB: [birth year]   53 y.o.   MRN: [id number] Visit Date: 08/20/2019  Today's Provider: Wilhemena Durie, MD   Chief Complaint  Patient presents with  . Medication Management   Subjective:     HPI  Pt depressed and having crying spells even during work day.He is not suicidal and still has counseling weekly. Anxiety From 12/03/2018-Changed Xanax to Lorazepam 1 mg every 8 hours as needed at this may help more as it is last longer. ispense: 90 tablet; Refill: 4  Reactive depression From 12/03/2018-Considered adding Remeron tonight but will let him see his counselor Vivia Budge tomorrow.  Again, no suicidal or homicidal ideation.  I do think he needs time and to work through this situation.   Allergies  Allergen Reactions  . Codeine Nausea And Vomiting and Other (See Comments)    Muscle tightness      Current Outpatient Medications:  .  buPROPion (WELLBUTRIN XL) 150 MG 24 hr tablet, TAKE 1 TABLET BY MOUTH EVERY DAY, Disp: 90 tablet, Rfl: 5 .  LORazepam (ATIVAN) 1 MG tablet, TAKE 1 TABLET (1 MG TOTAL) BY MOUTH EVERY 8 (EIGHT) HOURS AS NEEDED FOR ANXIETY., Disp: 90 tablet, Rfl: 2 .  pantoprazole (PROTONIX) 40 MG tablet, Take 1 tablet (40 mg total) by mouth 2 (two) times daily., Disp: 60 tablet, Rfl: 3 .  promethazine (PHENERGAN) 25 MG tablet, Take 1 tablet (25 mg total) by mouth every 6 (six) hours as needed for nausea or vomiting., Disp: 60 tablet, Rfl: 1 .  rOPINIRole (REQUIP) 0.5 MG tablet, Take 0.5 mg by mouth at bedtime., Disp: , Rfl:  .  sertraline (ZOLOFT) 100 MG tablet, TAKE 1 TABLET (100 MG TOTAL) BY MOUTH DAILY. TAKE 2 DAILY STARTING 12/20/18., Disp: 180 tablet, Rfl: 2 .  temazepam (RESTORIL) 30 MG capsule, TAKE 1 CAPSULE (30 MG TOTAL) BY MOUTH AT BEDTIME AS NEEDED FOR SLEEP. *NOT COVERED BY INSURANCE*, Disp: 30 capsule, Rfl: 1 .  valACYclovir (VALTREX) 1000 MG tablet, Take 1 tablet (1,000 mg total) 3 (three) times daily by mouth.,  Disp: 21 tablet, Rfl: 0 .  zolpidem (AMBIEN) 10 MG tablet, TAKE 1 TABLET BY MOUTH AT BEDTIME AS NEEDED FOR SLEEP, Disp: 15 tablet, Rfl: 2 .  amoxicillin-clavulanate (AUGMENTIN) 875-125 MG tablet, Take 1 tablet by mouth 2 (two) times daily., Disp: 20 tablet, Rfl: 1 .  Ascorbic Acid (VITAMIN C) 1000 MG tablet, Take by mouth., Disp: , Rfl:  .  cholecalciferol (VITAMIN D) 1000 units tablet, Take 5,000 Units by mouth daily. Take 5,000 units daily, Disp: , Rfl:  .  Cyanocobalamin (RA VITAMIN B-12 TR) 1000 MCG TBCR, Take by mouth., Disp: , Rfl:  .  diazepam (VALIUM) 5 MG tablet, TAKE 1/2-1 TAB BY MOUTH EVERY 6HRS AS NEEDED FOR SPASMS/SLEEP (Patient not taking: Reported on 08/20/2019), Disp: 30 tablet, Rfl: 5 .  doxycycline (VIBRA-TABS) 100 MG tablet, Take 1 tablet (100 mg total) by mouth 2 (two) times daily., Disp: 20 tablet, Rfl: 0 .  oxymetazoline (AFRIN) 0.05 % nasal spray, Place 2 sprays into both nostrils 2 (two) times daily as needed for congestion., Disp: , Rfl:  .  triamcinolone cream (KENALOG) 0.1 %, Apply 1 application topically 2 (two) times daily. (Patient not taking: Reported on 08/20/2019), Disp: 30 g, Rfl: 0  Review of Systems  Constitutional: Negative for appetite change, chills and fever.  HENT: Positive for congestion, sinus pain and sore throat.  Eyes: Negative.   Respiratory: Negative for chest tightness, shortness of breath and wheezing.   Cardiovascular: Negative for chest pain and palpitations.  Gastrointestinal: Negative for abdominal pain, nausea and vomiting.  Musculoskeletal: Positive for back pain.  Allergic/Immunologic: Negative.   Hematological: Negative.   Psychiatric/Behavioral: Positive for dysphoric mood and sleep disturbance.    Social History   Tobacco Use  . Smoking status: Never Smoker  . Smokeless tobacco: Never Used  Substance Use Topics  . Alcohol use: Yes    Comment: rare- 2 beers per month       Objective:   BP 116/79 (BP Location: Left Arm,  Patient Position: Sitting, Cuff Size: Large)   Pulse 70   Temp (!) 96.9 F (36.1 C) (Other (Comment))   Resp 16   Ht 6\' 2"  (1.88 m)   Wt 204 lb (92.5 kg)   SpO2 98%   BMI 26.19 kg/m  Vitals:   08/20/19 1630  BP: 116/79  Pulse: 70  Resp: 16  Temp: (!) 96.9 F (36.1 C)  TempSrc: Other (Comment)  SpO2: 98%  Weight: 204 lb (92.5 kg)  Height: 6\' 2"  (1.88 m)  Body mass index is 26.19 kg/m.   Physical Exam Vitals signs reviewed.  Constitutional:      Appearance: Normal appearance. He is well-developed and normal weight.  HENT:     Head: Normocephalic and atraumatic.     Comments: Mildly tender over maxillary sinuses.    Right Ear: External ear normal.     Left Ear: External ear normal.     Nose: Nose normal.  Eyes:     General: No scleral icterus.    Conjunctiva/sclera: Conjunctivae normal.  Neck:     Thyroid: No thyromegaly.  Cardiovascular:     Rate and Rhythm: Normal rate and regular rhythm.     Heart sounds: Normal heart sounds.  Pulmonary:     Effort: Pulmonary effort is normal.     Breath sounds: Normal breath sounds.  Abdominal:     Palpations: Abdomen is soft.  Genitourinary:    Penis: Normal.   Musculoskeletal:     Comments: Mild tenderness over left SI joint.  Lymphadenopathy:     Cervical: No cervical adenopathy.  Skin:    General: Skin is warm and dry.  Neurological:     General: No focal deficit present.     Mental Status: He is alert and oriented to person, place, and time. Mental status is at baseline.  Psychiatric:        Behavior: Behavior normal.        Thought Content: Thought content normal.        Judgment: Judgment normal.     Comments: Crying during exam.      No results found for any visits on 08/20/19.     Assessment & Plan    1. Reactive depression Increase wellbutrin to 300mg  daily-RTC 1 month.  2. Chronic sinusitis, unspecified location I think this is allergic in origin.  3. Seasonal allergic rhinitis due to pollen    4. Low back strain, initial encounter Try flexeril. Celebrex for 2 weeks. Seeing chiropractor.    Deborah , MD  [facility]

## 2019-08-21 DIAGNOSIS — M545 Low back pain: Secondary | ICD-10-CM | POA: Diagnosis not present

## 2019-08-21 DIAGNOSIS — M5418 Radiculopathy, sacral and sacrococcygeal region: Secondary | ICD-10-CM | POA: Diagnosis not present

## 2019-08-21 DIAGNOSIS — M9903 Segmental and somatic dysfunction of lumbar region: Secondary | ICD-10-CM | POA: Diagnosis not present

## 2019-08-21 DIAGNOSIS — M5417 Radiculopathy, lumbosacral region: Secondary | ICD-10-CM | POA: Diagnosis not present

## 2019-08-22 DIAGNOSIS — M9903 Segmental and somatic dysfunction of lumbar region: Secondary | ICD-10-CM | POA: Diagnosis not present

## 2019-08-22 DIAGNOSIS — M545 Low back pain: Secondary | ICD-10-CM | POA: Diagnosis not present

## 2019-08-22 DIAGNOSIS — M5418 Radiculopathy, sacral and sacrococcygeal region: Secondary | ICD-10-CM | POA: Diagnosis not present

## 2019-08-22 DIAGNOSIS — M5417 Radiculopathy, lumbosacral region: Secondary | ICD-10-CM | POA: Diagnosis not present

## 2019-08-23 DIAGNOSIS — M545 Low back pain: Secondary | ICD-10-CM | POA: Diagnosis not present

## 2019-08-23 DIAGNOSIS — M5417 Radiculopathy, lumbosacral region: Secondary | ICD-10-CM | POA: Diagnosis not present

## 2019-08-23 DIAGNOSIS — F4322 Adjustment disorder with anxiety: Secondary | ICD-10-CM | POA: Diagnosis not present

## 2019-08-23 DIAGNOSIS — M5418 Radiculopathy, sacral and sacrococcygeal region: Secondary | ICD-10-CM | POA: Diagnosis not present

## 2019-08-23 DIAGNOSIS — M9903 Segmental and somatic dysfunction of lumbar region: Secondary | ICD-10-CM | POA: Diagnosis not present

## 2019-08-26 DIAGNOSIS — M5417 Radiculopathy, lumbosacral region: Secondary | ICD-10-CM | POA: Diagnosis not present

## 2019-08-26 DIAGNOSIS — M5418 Radiculopathy, sacral and sacrococcygeal region: Secondary | ICD-10-CM | POA: Diagnosis not present

## 2019-08-26 DIAGNOSIS — M545 Low back pain: Secondary | ICD-10-CM | POA: Diagnosis not present

## 2019-08-26 DIAGNOSIS — M9903 Segmental and somatic dysfunction of lumbar region: Secondary | ICD-10-CM | POA: Diagnosis not present

## 2019-08-27 DIAGNOSIS — M545 Low back pain: Secondary | ICD-10-CM | POA: Diagnosis not present

## 2019-08-27 DIAGNOSIS — M5418 Radiculopathy, sacral and sacrococcygeal region: Secondary | ICD-10-CM | POA: Diagnosis not present

## 2019-08-27 DIAGNOSIS — M5417 Radiculopathy, lumbosacral region: Secondary | ICD-10-CM | POA: Diagnosis not present

## 2019-08-27 DIAGNOSIS — M9903 Segmental and somatic dysfunction of lumbar region: Secondary | ICD-10-CM | POA: Diagnosis not present

## 2019-08-28 DIAGNOSIS — M5417 Radiculopathy, lumbosacral region: Secondary | ICD-10-CM | POA: Diagnosis not present

## 2019-08-28 DIAGNOSIS — M5418 Radiculopathy, sacral and sacrococcygeal region: Secondary | ICD-10-CM | POA: Diagnosis not present

## 2019-08-28 DIAGNOSIS — M9903 Segmental and somatic dysfunction of lumbar region: Secondary | ICD-10-CM | POA: Diagnosis not present

## 2019-08-28 DIAGNOSIS — M545 Low back pain: Secondary | ICD-10-CM | POA: Diagnosis not present

## 2019-09-02 ENCOUNTER — Other Ambulatory Visit: Payer: Self-pay | Admitting: Family Medicine

## 2019-09-02 DIAGNOSIS — J329 Chronic sinusitis, unspecified: Secondary | ICD-10-CM

## 2019-09-02 DIAGNOSIS — J301 Allergic rhinitis due to pollen: Secondary | ICD-10-CM

## 2019-09-02 MED ORDER — PREDNISONE 10 MG (21) PO TBPK: ORAL_TABLET | ORAL | 1 refills | Status: DC

## 2019-09-04 DIAGNOSIS — F4322 Adjustment disorder with anxiety: Secondary | ICD-10-CM | POA: Diagnosis not present

## 2019-09-11 ENCOUNTER — Other Ambulatory Visit: Payer: Self-pay | Admitting: Family Medicine

## 2019-09-11 DIAGNOSIS — R0981 Nasal congestion: Secondary | ICD-10-CM | POA: Diagnosis not present

## 2019-09-11 DIAGNOSIS — Z20828 Contact with and (suspected) exposure to other viral communicable diseases: Secondary | ICD-10-CM | POA: Diagnosis not present

## 2019-09-11 DIAGNOSIS — J301 Allergic rhinitis due to pollen: Secondary | ICD-10-CM

## 2019-09-11 DIAGNOSIS — J329 Chronic sinusitis, unspecified: Secondary | ICD-10-CM

## 2019-09-11 MED ORDER — PREDNISONE 10 MG (21) PO TBPK: ORAL_TABLET | ORAL | 1 refills | Status: DC

## 2019-09-12 ENCOUNTER — Other Ambulatory Visit: Payer: Self-pay | Admitting: Family Medicine

## 2019-09-12 DIAGNOSIS — F329 Major depressive disorder, single episode, unspecified: Secondary | ICD-10-CM

## 2019-09-12 NOTE — Telephone Encounter (Signed)
Requested medication (s) are due for refill today: yes  Requested medication (s) are on the active medication list: yes  Last refill:  08/20/2019  Future visit scheduled: no  Notes to clinic:  requesting year supply   Requested Prescriptions  Pending Prescriptions Disp Refills   buPROPion (WELLBUTRIN XL) 300 MG 24 hr tablet [Pharmacy Med Name: BUPROPION HCL XL 300 MG TABLET] 90 tablet 4    Sig: TAKE 1 TABLET BY MOUTH EVERY DAY      Psychiatry: Antidepressants - bupropion Passed - 09/12/2019 12:32 PM      Passed - Last BP in normal range    BP Readings from Last 1 Encounters:  08/20/19 116/79          Passed - Valid encounter within last 6 months    Recent Outpatient Visits           3 weeks ago Reactive depression   [facility] Jerrol Banana., MD   9 months ago Gastroesophageal reflux disease, esophagitis presence not specified   [facility] Jerrol Banana., MD   11 months ago Annual physical exam   [facility] Jerrol Banana., MD   1 year ago Vertigo   [facility] Jerrol Banana., MD   1 year ago Vertigo   [facility] Jerrol Banana., MD              Passed - Completed PHQ-2 or PHQ-9 in the last 360 days.

## 2019-09-13 DIAGNOSIS — F4322 Adjustment disorder with anxiety: Secondary | ICD-10-CM | POA: Diagnosis not present

## 2019-09-14 ENCOUNTER — Other Ambulatory Visit: Payer: Self-pay | Admitting: Family Medicine

## 2019-09-15 NOTE — Telephone Encounter (Signed)
Requested medication (s) are due for refill today: yes  Requested medication (s) are on the active medication list:yes  Last refill:  09/28/2018  Future visit scheduled: no  Notes to clinic:  refill cannot be delegated    Requested Prescriptions  Pending Prescriptions Disp Refills   diazepam (VALIUM) 5 MG tablet [Pharmacy Med Name: DIAZEPAM 5 MG TABLET] 30 tablet     Sig: TAKE 1/2-1 TAB BY MOUTH EVERY 6HRS AS NEEDED FOR SPASMS/SLEEP      Not Delegated - Psychiatry:  Anxiolytics/Hypnotics Failed - 09/14/2019 10:41 PM      Failed - This refill cannot be delegated      Failed - Urine Drug Screen completed in last 360 days.      Passed - Valid encounter within last 6 months    Recent Outpatient Visits           3 weeks ago Reactive depression   [facility] Jerrol Banana., MD   9 months ago Gastroesophageal reflux disease, esophagitis presence not specified   [facility] Jerrol Banana., MD   11 months ago Annual physical exam   [facility] Jerrol Banana., MD   1 year ago [place] Jerrol Banana., MD   1 year ago Vertigo   [facility] Jerrol Banana., MD

## 2019-09-26 ENCOUNTER — Other Ambulatory Visit: Payer: Self-pay | Admitting: Family Medicine

## 2019-09-26 DIAGNOSIS — F419 Anxiety disorder, unspecified: Secondary | ICD-10-CM

## 2019-09-26 NOTE — Telephone Encounter (Signed)
Requested medication (s) are due for refill today: yes  Requested medication (s) are on the active medication list: yes  Last refill:  08/26/2019  Future visit scheduled: no  Notes to clinic:  This refill cannot be delegated    Requested Prescriptions  Pending Prescriptions Disp Refills   LORazepam (ATIVAN) 1 MG tablet [Pharmacy Med Name: LORAZEPAM 1 MG TABLET] 90 tablet     Sig: TAKE 1 TABLET (1 MG TOTAL) BY MOUTH EVERY 8 (EIGHT) HOURS AS NEEDED FOR ANXIETY.      Not Delegated - Psychiatry:  Anxiolytics/Hypnotics Failed - 09/26/2019  9:56 AM      Failed - This refill cannot be delegated      Failed - Urine Drug Screen completed in last 360 days.      Passed - Valid encounter within last 6 months    Recent Outpatient Visits           1 month ago Reactive depression   [facility] Jerrol Banana., MD   9 months ago Gastroesophageal reflux disease, esophagitis presence not specified   [facility] Jerrol Banana., MD   11 months ago Annual physical exam   [facility] Jerrol Banana., MD   1 year ago [place] Jerrol Banana., MD   1 year ago Vertigo   [facility] Jerrol Banana., MD

## 2019-10-01 DIAGNOSIS — F4322 Adjustment disorder with anxiety: Secondary | ICD-10-CM | POA: Diagnosis not present

## 2019-10-13 ENCOUNTER — Other Ambulatory Visit: Payer: Self-pay | Admitting: Family Medicine

## 2019-10-13 DIAGNOSIS — S39012A Strain of muscle, fascia and tendon of lower back, initial encounter: Secondary | ICD-10-CM

## 2019-10-14 NOTE — Telephone Encounter (Signed)
Requested medication (s) are due for refill today:yes  Requested medication (s) are on the active medication list: yes  Last refill:  09/11/2019  Future visit scheduled: no  Notes to clinic:  This refill cannot be delegated    Requested Prescriptions  Pending Prescriptions Disp Refills   temazepam (RESTORIL) 30 MG capsule [Pharmacy Med Name: TEMAZEPAM 30 MG CAPSULE] 30 capsule 1    Sig: TAKE 1 CAPSULE (30 MG TOTAL) BY MOUTH AT BEDTIME AS NEEDED FOR SLEEP. *NOT COVERED BY INSURANCE*      Not Delegated - Psychiatry:  Anxiolytics/Hypnotics Failed - 10/14/2019  7:07 AM      Failed - This refill cannot be delegated      Failed - Urine Drug Screen completed in last 360 days.      Passed - Valid encounter within last 6 months    Recent Outpatient Visits           1 month ago Reactive depression   [facility] [name], MD   10 months ago Gastroesophageal reflux disease, esophagitis presence not specified   [facility] [name], MD   1 year ago Annual physical exam   [facility] [name], MD   1 year ago Vertigo   [facility] [name], MD   1 year ago Vertigo   [facility] [name], MD

## 2019-10-16 ENCOUNTER — Other Ambulatory Visit: Payer: Self-pay | Admitting: Family Medicine

## 2019-10-17 DIAGNOSIS — F4322 Adjustment disorder with anxiety: Secondary | ICD-10-CM | POA: Diagnosis not present

## 2019-10-24 DIAGNOSIS — F4322 Adjustment disorder with anxiety: Secondary | ICD-10-CM | POA: Diagnosis not present

## 2019-10-28 DIAGNOSIS — I493 Ventricular premature depolarization: Secondary | ICD-10-CM | POA: Diagnosis not present

## 2019-10-28 DIAGNOSIS — E049 Nontoxic goiter, unspecified: Secondary | ICD-10-CM | POA: Diagnosis not present

## 2019-10-28 DIAGNOSIS — R1310 Dysphagia, unspecified: Secondary | ICD-10-CM | POA: Diagnosis not present

## 2019-10-28 DIAGNOSIS — H0520 Unspecified exophthalmos: Secondary | ICD-10-CM | POA: Diagnosis not present

## 2019-10-28 DIAGNOSIS — G2581 Restless legs syndrome: Secondary | ICD-10-CM | POA: Diagnosis not present

## 2019-10-28 DIAGNOSIS — F4322 Adjustment disorder with anxiety: Secondary | ICD-10-CM | POA: Diagnosis not present

## 2019-10-30 DIAGNOSIS — J310 Chronic rhinitis: Secondary | ICD-10-CM | POA: Diagnosis not present

## 2019-11-05 DIAGNOSIS — H0520 Unspecified exophthalmos: Secondary | ICD-10-CM | POA: Diagnosis not present

## 2019-11-05 DIAGNOSIS — R1310 Dysphagia, unspecified: Secondary | ICD-10-CM | POA: Diagnosis not present

## 2019-11-05 DIAGNOSIS — E049 Nontoxic goiter, unspecified: Secondary | ICD-10-CM | POA: Diagnosis not present

## 2019-11-05 DIAGNOSIS — I493 Ventricular premature depolarization: Secondary | ICD-10-CM | POA: Diagnosis not present

## 2019-11-05 DIAGNOSIS — F4322 Adjustment disorder with anxiety: Secondary | ICD-10-CM | POA: Diagnosis not present

## 2019-11-06 DIAGNOSIS — F4322 Adjustment disorder with anxiety: Secondary | ICD-10-CM | POA: Diagnosis not present

## 2019-11-08 ENCOUNTER — Other Ambulatory Visit: Payer: Self-pay | Admitting: Physician Assistant

## 2019-11-08 DIAGNOSIS — F419 Anxiety disorder, unspecified: Secondary | ICD-10-CM

## 2019-11-13 DIAGNOSIS — R569 Unspecified convulsions: Secondary | ICD-10-CM | POA: Diagnosis not present

## 2019-11-13 DIAGNOSIS — R42 Dizziness and giddiness: Secondary | ICD-10-CM | POA: Diagnosis not present

## 2019-11-13 DIAGNOSIS — G2581 Restless legs syndrome: Secondary | ICD-10-CM | POA: Diagnosis not present

## 2019-11-13 DIAGNOSIS — G43119 Migraine with aura, intractable, without status migrainosus: Secondary | ICD-10-CM | POA: Diagnosis not present

## 2019-11-21 DIAGNOSIS — F4322 Adjustment disorder with anxiety: Secondary | ICD-10-CM | POA: Diagnosis not present

## 2019-11-25 ENCOUNTER — Other Ambulatory Visit: Payer: Self-pay | Admitting: Neurology

## 2019-11-25 DIAGNOSIS — R569 Unspecified convulsions: Secondary | ICD-10-CM

## 2019-11-27 ENCOUNTER — Ambulatory Visit
Admission: RE | Admit: 2019-11-27 | Discharge: 2019-11-27 | Disposition: A | Discharge status: Home / Self Care | Payer: BC Managed Care – PPO | Source: Ambulatory Visit | Attending: Neurology | Admitting: Neurology

## 2019-11-27 DIAGNOSIS — J3489 Other specified disorders of nose and nasal sinuses: Secondary | ICD-10-CM | POA: Diagnosis not present

## 2019-11-27 DIAGNOSIS — R569 Unspecified convulsions: Secondary | ICD-10-CM

## 2019-11-27 MED ORDER — GADOBUTROL 1 MMOL/ML IV SOLN
9.0000 mL | Freq: Once | INTRAVENOUS | Status: AC | PRN
  Administered 2019-11-27: 13:00:00 9 mL via INTRAVENOUS

## 2019-11-28 DIAGNOSIS — R42 Dizziness and giddiness: Secondary | ICD-10-CM | POA: Diagnosis not present

## 2019-11-29 DIAGNOSIS — Z6826 Body mass index (BMI) 26.0-26.9, adult: Secondary | ICD-10-CM | POA: Diagnosis not present

## 2019-11-29 DIAGNOSIS — J310 Chronic rhinitis: Secondary | ICD-10-CM | POA: Diagnosis not present

## 2019-12-05 DIAGNOSIS — F4322 Adjustment disorder with anxiety: Secondary | ICD-10-CM | POA: Diagnosis not present

## 2019-12-06 DIAGNOSIS — R569 Unspecified convulsions: Secondary | ICD-10-CM | POA: Diagnosis not present

## 2019-12-23 DIAGNOSIS — F4322 Adjustment disorder with anxiety: Secondary | ICD-10-CM | POA: Diagnosis not present

## 2019-12-29 ENCOUNTER — Other Ambulatory Visit: Payer: Self-pay | Admitting: Family Medicine

## 2019-12-29 DIAGNOSIS — G4700 Insomnia, unspecified: Secondary | ICD-10-CM

## 2019-12-30 NOTE — Telephone Encounter (Signed)
Please advise? Last OV 08/20/19.

## 2019-12-30 NOTE — Telephone Encounter (Signed)
Requested medication (s) are due for refill today: yes  Requested medication (s) are on the active medication list: yes  Last refill:  10/29/19  Future visit scheduled: no  Notes to clinic:  not delegated    Requested Prescriptions  Pending Prescriptions Disp Refills   zolpidem (AMBIEN) 10 MG tablet [Pharmacy Med Name: ZOLPIDEM TARTRATE 10 MG TABLET] 15 tablet 2    Sig: TAKE 1 TABLET BY MOUTH EVERY DAY AT BEDTIME AS NEEDED FOR SLEEP      Not Delegated - Psychiatry:  Anxiolytics/Hypnotics Failed - 12/29/2019  8:15 PM      Failed - This refill cannot be delegated      Failed - Urine Drug Screen completed in last 360 days.      Passed - Valid encounter within last 6 months    Recent Outpatient Visits           4 months ago Reactive depression   [facility] [name], MD   1 year ago Gastroesophageal reflux disease, esophagitis presence not specified   [facility] [name], MD   1 year ago Annual physical exam   [facility] [name], MD   1 year ago Vertigo   [facility] [name], MD   1 year ago Vertigo   [facility] [name], MD

## 2019-12-31 DIAGNOSIS — R569 Unspecified convulsions: Secondary | ICD-10-CM | POA: Diagnosis not present

## 2020-01-06 DIAGNOSIS — S9031XA Contusion of right foot, initial encounter: Secondary | ICD-10-CM | POA: Diagnosis not present

## 2020-01-06 DIAGNOSIS — F4322 Adjustment disorder with anxiety: Secondary | ICD-10-CM | POA: Diagnosis not present

## 2020-01-06 DIAGNOSIS — S90121A Contusion of right lesser toe(s) without damage to nail, initial encounter: Secondary | ICD-10-CM | POA: Diagnosis not present

## 2020-01-16 DIAGNOSIS — J3489 Other specified disorders of nose and nasal sinuses: Secondary | ICD-10-CM | POA: Diagnosis not present

## 2020-01-21 DIAGNOSIS — F4322 Adjustment disorder with anxiety: Secondary | ICD-10-CM | POA: Diagnosis not present

## 2020-02-03 ENCOUNTER — Other Ambulatory Visit: Payer: Self-pay | Admitting: Family Medicine

## 2020-02-03 DIAGNOSIS — F4322 Adjustment disorder with anxiety: Secondary | ICD-10-CM | POA: Diagnosis not present

## 2020-02-03 NOTE — Telephone Encounter (Signed)
Requested medication (s) are due for refill today: yes  Requested medication (s) are on the active medication list: yes  Last refill:  12/29/2019  Future visit scheduled: no  Notes to clinic:  this refill cannot be delegated    Requested Prescriptions  Pending Prescriptions Disp Refills   temazepam (RESTORIL) 30 MG capsule [Pharmacy Med Name: TEMAZEPAM 30 MG CAPSULE] 30 capsule 1    Sig: TAKE 1 CAPSULE (30 MG TOTAL) BY MOUTH AT BEDTIME AS NEEDED FOR SLEEP. *NOT COVERED BY INSURANCE*      Not Delegated - Psychiatry:  Anxiolytics/Hypnotics Failed - 02/03/2020  5:47 AM      Failed - This refill cannot be delegated      Failed - Urine Drug Screen completed in last 360 days.      Passed - Valid encounter within last 6 months    Recent Outpatient Visits           5 months ago Reactive depression   [facility] [name], MD   1 year ago Gastroesophageal reflux disease, esophagitis presence not specified   [facility] [name], MD   1 year ago Annual physical exam   [facility] [name], MD   1 year ago Vertigo   [facility] [name], MD   1 year ago Vertigo   [facility] [name], MD

## 2020-02-04 DIAGNOSIS — I493 Ventricular premature depolarization: Secondary | ICD-10-CM | POA: Diagnosis not present

## 2020-02-04 DIAGNOSIS — I10 Essential (primary) hypertension: Secondary | ICD-10-CM | POA: Diagnosis not present

## 2020-02-04 DIAGNOSIS — R079 Chest pain, unspecified: Secondary | ICD-10-CM | POA: Diagnosis not present

## 2020-02-04 DIAGNOSIS — R000 Tachycardia, unspecified: Secondary | ICD-10-CM | POA: Diagnosis not present

## 2020-02-07 DIAGNOSIS — J310 Chronic rhinitis: Secondary | ICD-10-CM | POA: Diagnosis not present

## 2020-02-07 DIAGNOSIS — Z6826 Body mass index (BMI) 26.0-26.9, adult: Secondary | ICD-10-CM | POA: Diagnosis not present

## 2020-02-18 DIAGNOSIS — F4322 Adjustment disorder with anxiety: Secondary | ICD-10-CM | POA: Diagnosis not present

## 2020-02-20 DIAGNOSIS — E061 Subacute thyroiditis: Secondary | ICD-10-CM | POA: Diagnosis not present

## 2020-02-20 DIAGNOSIS — M7918 Myalgia, other site: Secondary | ICD-10-CM | POA: Diagnosis not present

## 2020-02-20 DIAGNOSIS — E049 Nontoxic goiter, unspecified: Secondary | ICD-10-CM | POA: Diagnosis not present

## 2020-02-21 DIAGNOSIS — R1310 Dysphagia, unspecified: Secondary | ICD-10-CM | POA: Diagnosis not present

## 2020-02-21 DIAGNOSIS — E061 Subacute thyroiditis: Secondary | ICD-10-CM | POA: Diagnosis not present

## 2020-02-21 DIAGNOSIS — H0520 Unspecified exophthalmos: Secondary | ICD-10-CM | POA: Diagnosis not present

## 2020-02-21 DIAGNOSIS — M542 Cervicalgia: Secondary | ICD-10-CM | POA: Diagnosis not present

## 2020-02-21 DIAGNOSIS — E7800 Pure hypercholesterolemia, unspecified: Secondary | ICD-10-CM | POA: Diagnosis not present

## 2020-02-21 DIAGNOSIS — M7918 Myalgia, other site: Secondary | ICD-10-CM | POA: Diagnosis not present

## 2020-02-21 DIAGNOSIS — E049 Nontoxic goiter, unspecified: Secondary | ICD-10-CM | POA: Diagnosis not present

## 2020-02-26 DIAGNOSIS — H0520 Unspecified exophthalmos: Secondary | ICD-10-CM | POA: Diagnosis not present

## 2020-02-26 DIAGNOSIS — M7918 Myalgia, other site: Secondary | ICD-10-CM | POA: Diagnosis not present

## 2020-02-26 DIAGNOSIS — I493 Ventricular premature depolarization: Secondary | ICD-10-CM | POA: Diagnosis not present

## 2020-02-26 DIAGNOSIS — E049 Nontoxic goiter, unspecified: Secondary | ICD-10-CM | POA: Diagnosis not present

## 2020-02-26 DIAGNOSIS — E061 Subacute thyroiditis: Secondary | ICD-10-CM | POA: Diagnosis not present

## 2020-02-26 DIAGNOSIS — R1310 Dysphagia, unspecified: Secondary | ICD-10-CM | POA: Diagnosis not present

## 2020-03-04 DIAGNOSIS — F4322 Adjustment disorder with anxiety: Secondary | ICD-10-CM | POA: Diagnosis not present

## 2020-03-06 DIAGNOSIS — J310 Chronic rhinitis: Secondary | ICD-10-CM | POA: Diagnosis not present

## 2020-03-12 ENCOUNTER — Encounter: Payer: Self-pay | Admitting: Emergency Medicine

## 2020-03-12 ENCOUNTER — Emergency Department: Payer: BC Managed Care – PPO

## 2020-03-12 ENCOUNTER — Inpatient Hospital Stay
Admission: EM | Admit: 2020-03-12 | Discharge: 2020-03-15 | DRG: 871 | Disposition: A | Discharge status: Home / Self Care | Payer: BC Managed Care – PPO | Attending: Internal Medicine | Admitting: Internal Medicine

## 2020-03-12 ENCOUNTER — Other Ambulatory Visit: Payer: Self-pay

## 2020-03-12 DIAGNOSIS — M542 Cervicalgia: Secondary | ICD-10-CM | POA: Diagnosis not present

## 2020-03-12 DIAGNOSIS — H0520 Unspecified exophthalmos: Secondary | ICD-10-CM | POA: Diagnosis not present

## 2020-03-12 DIAGNOSIS — Z85828 Personal history of other malignant neoplasm of skin: Secondary | ICD-10-CM | POA: Diagnosis not present

## 2020-03-12 DIAGNOSIS — E7800 Pure hypercholesterolemia, unspecified: Secondary | ICD-10-CM | POA: Diagnosis not present

## 2020-03-12 DIAGNOSIS — R0602 Shortness of breath: Secondary | ICD-10-CM | POA: Diagnosis not present

## 2020-03-12 DIAGNOSIS — F329 Major depressive disorder, single episode, unspecified: Secondary | ICD-10-CM | POA: Diagnosis present

## 2020-03-12 DIAGNOSIS — W57XXXA Bitten or stung by nonvenomous insect and other nonvenomous arthropods, initial encounter: Secondary | ICD-10-CM | POA: Diagnosis present

## 2020-03-12 DIAGNOSIS — A419 Sepsis, unspecified organism: Principal | ICD-10-CM | POA: Diagnosis present

## 2020-03-12 DIAGNOSIS — K2100 Gastro-esophageal reflux disease with esophagitis, without bleeding: Secondary | ICD-10-CM | POA: Diagnosis present

## 2020-03-12 DIAGNOSIS — E860 Dehydration: Secondary | ICD-10-CM

## 2020-03-12 DIAGNOSIS — Z87442 Personal history of urinary calculi: Secondary | ICD-10-CM

## 2020-03-12 DIAGNOSIS — R519 Headache, unspecified: Secondary | ICD-10-CM | POA: Diagnosis not present

## 2020-03-12 DIAGNOSIS — Z79899 Other long term (current) drug therapy: Secondary | ICD-10-CM

## 2020-03-12 DIAGNOSIS — G4700 Insomnia, unspecified: Secondary | ICD-10-CM | POA: Diagnosis present

## 2020-03-12 DIAGNOSIS — G5600 Carpal tunnel syndrome, unspecified upper limb: Secondary | ICD-10-CM | POA: Diagnosis present

## 2020-03-12 DIAGNOSIS — R509 Fever, unspecified: Secondary | ICD-10-CM | POA: Diagnosis not present

## 2020-03-12 DIAGNOSIS — E0591 Thyrotoxicosis, unspecified with thyrotoxic crisis or storm: Secondary | ICD-10-CM | POA: Diagnosis present

## 2020-03-12 DIAGNOSIS — R42 Dizziness and giddiness: Secondary | ICD-10-CM | POA: Diagnosis not present

## 2020-03-12 DIAGNOSIS — Z820 Family history of epilepsy and other diseases of the nervous system: Secondary | ICD-10-CM

## 2020-03-12 DIAGNOSIS — F419 Anxiety disorder, unspecified: Secondary | ICD-10-CM | POA: Diagnosis not present

## 2020-03-12 DIAGNOSIS — R55 Syncope and collapse: Secondary | ICD-10-CM | POA: Diagnosis present

## 2020-03-12 DIAGNOSIS — I493 Ventricular premature depolarization: Secondary | ICD-10-CM | POA: Diagnosis not present

## 2020-03-12 DIAGNOSIS — G20 Parkinson's disease: Secondary | ICD-10-CM | POA: Diagnosis not present

## 2020-03-12 DIAGNOSIS — F4322 Adjustment disorder with anxiety: Secondary | ICD-10-CM | POA: Diagnosis not present

## 2020-03-12 DIAGNOSIS — Z9049 Acquired absence of other specified parts of digestive tract: Secondary | ICD-10-CM

## 2020-03-12 DIAGNOSIS — G2581 Restless legs syndrome: Secondary | ICD-10-CM | POA: Diagnosis not present

## 2020-03-12 DIAGNOSIS — Z20822 Contact with and (suspected) exposure to covid-19: Secondary | ICD-10-CM | POA: Diagnosis present

## 2020-03-12 DIAGNOSIS — N400 Enlarged prostate without lower urinary tract symptoms: Secondary | ICD-10-CM | POA: Diagnosis present

## 2020-03-12 DIAGNOSIS — E061 Subacute thyroiditis: Secondary | ICD-10-CM | POA: Diagnosis not present

## 2020-03-12 DIAGNOSIS — E049 Nontoxic goiter, unspecified: Secondary | ICD-10-CM | POA: Diagnosis not present

## 2020-03-12 DIAGNOSIS — Z885 Allergy status to narcotic agent status: Secondary | ICD-10-CM

## 2020-03-12 DIAGNOSIS — R300 Dysuria: Secondary | ICD-10-CM | POA: Diagnosis not present

## 2020-03-12 LAB — COMPREHENSIVE METABOLIC PANEL
ALT: 70 U/L — ABNORMAL HIGH (ref 0–44)
AST: 65 U/L — ABNORMAL HIGH (ref 15–41)
Albumin: 4 g/dL (ref 3.5–5.0)
Alkaline Phosphatase: 139 U/L — ABNORMAL HIGH (ref 38–126)
Anion gap: 11 (ref 5–15)
BUN: 19 mg/dL (ref 6–20)
CO2: 24 mmol/L (ref 22–32)
Calcium: 8.9 mg/dL (ref 8.9–10.3)
Chloride: 99 mmol/L (ref 98–111)
Creatinine, Ser: 0.94 mg/dL (ref 0.61–1.24)
GFR calc Af Amer: >60 mL/min (ref >60)
GFR calc non Af Amer: >60 mL/min (ref >60)
Glucose, Bld: 105 mg/dL — ABNORMAL HIGH (ref 70–99)
Potassium: 4.2 mmol/L (ref 3.5–5.1)
Sodium: 134 mmol/L — ABNORMAL LOW (ref 135–145)
Total Bilirubin: 0.7 mg/dL (ref 0.3–1.2)
Total Protein: 7.9 g/dL (ref 6.5–8.1)

## 2020-03-12 LAB — URINALYSIS, ROUTINE W REFLEX MICROSCOPIC
Bilirubin Urine: NEGATIVE
Glucose, UA: NEGATIVE mg/dL
Hgb urine dipstick: NEGATIVE
Ketones, ur: NEGATIVE mg/dL
Leukocytes,Ua: NEGATIVE
Nitrite: NEGATIVE
Protein, ur: NEGATIVE mg/dL
Specific Gravity, Urine: 1.006 (ref 1.005–1.030)
pH: 7 (ref 5.0–8.0)

## 2020-03-12 LAB — CBC WITH DIFFERENTIAL/PLATELET
Abs Immature Granulocytes: 0.08 10*3/uL — ABNORMAL HIGH (ref 0.00–0.07)
Basophils Absolute: 0.1 10*3/uL (ref 0.0–0.1)
Basophils Relative: 0 %
Eosinophils Absolute: 0.1 10*3/uL (ref 0.0–0.5)
Eosinophils Relative: 1 %
HCT: 41.4 % (ref 39.0–52.0)
Hemoglobin: 14.8 g/dL (ref 13.0–17.0)
Immature Granulocytes: 1 %
Lymphocytes Relative: 6 %
Lymphs Abs: 1 10*3/uL (ref 0.7–4.0)
MCH: 32.7 pg (ref 26.0–34.0)
MCHC: 35.7 g/dL (ref 30.0–36.0)
MCV: 91.6 fL (ref 80.0–100.0)
Monocytes Absolute: 0.9 10*3/uL (ref 0.1–1.0)
Monocytes Relative: 5 %
Neutro Abs: 14.6 10*3/uL — ABNORMAL HIGH (ref 1.7–7.7)
Neutrophils Relative %: 87 %
Platelets: 358 10*3/uL (ref 150–400)
RBC: 4.52 MIL/uL (ref 4.22–5.81)
RDW: 12.2 % (ref 11.5–15.5)
WBC: 16.7 10*3/uL — ABNORMAL HIGH (ref 4.0–10.5)
nRBC: 0 % (ref 0.0–0.2)

## 2020-03-12 LAB — APTT: aPTT: 27 seconds (ref 24–36)

## 2020-03-12 LAB — RESPIRATORY PANEL BY RT PCR (FLU A&B, COVID)
Influenza A by PCR: NEGATIVE
Influenza B by PCR: NEGATIVE
SARS Coronavirus 2 by RT PCR: NEGATIVE

## 2020-03-12 LAB — LACTIC ACID, PLASMA
Lactic Acid, Venous: 1.2 mmol/L (ref 0.5–1.9)
Lactic Acid, Venous: 1 mmol/L (ref 0.5–1.9)

## 2020-03-12 LAB — SARS CORONAVIRUS 2 BY RT PCR (HOSPITAL ORDER, PERFORMED IN ~~LOC~~ HOSPITAL LAB): SARS Coronavirus 2: NEGATIVE

## 2020-03-12 LAB — PROTIME-INR
INR: 1 (ref 0.8–1.2)
Prothrombin Time: 12.8 seconds (ref 11.4–15.2)

## 2020-03-12 LAB — GLUCOSE, CAPILLARY: Glucose-Capillary: 92 mg/dL (ref 70–99)

## 2020-03-12 LAB — TSH: TSH: 0.02 u[IU]/mL — ABNORMAL LOW (ref 0.350–4.500)

## 2020-03-12 MED ORDER — SODIUM CHLORIDE 0.9 % IV SOLN
1.0000 g | Freq: Once | INTRAVENOUS | Status: AC
  Administered 2020-03-12: 1 g via INTRAVENOUS
  Filled 2020-03-12: qty 10

## 2020-03-12 MED ORDER — SODIUM CHLORIDE 0.9 % IV SOLN
2.0000 g | Freq: Once | INTRAVENOUS | Status: AC
  Administered 2020-03-12: 2 g via INTRAVENOUS
  Filled 2020-03-12: qty 2

## 2020-03-12 MED ORDER — LACTATED RINGERS IV BOLUS (SEPSIS)
1000.0000 mL | Freq: Once | INTRAVENOUS | Status: AC
  Administered 2020-03-12: 1000 mL via INTRAVENOUS

## 2020-03-12 MED ORDER — SODIUM CHLORIDE 0.9 % IV BOLUS (SEPSIS)
1000.0000 mL | Freq: Once | INTRAVENOUS | Status: AC
  Administered 2020-03-12: 1000 mL via INTRAVENOUS

## 2020-03-12 MED ORDER — METRONIDAZOLE IN NACL 5-0.79 MG/ML-% IV SOLN
500.0000 mg | Freq: Three times a day (TID) | INTRAVENOUS | Status: DC
  Administered 2020-03-12 – 2020-03-14 (×6): 500 mg via INTRAVENOUS
  Filled 2020-03-12 (×8): qty 100

## 2020-03-12 MED ORDER — PANTOPRAZOLE SODIUM 40 MG PO TBEC
40.0000 mg | DELAYED_RELEASE_TABLET | Freq: Every day | ORAL | Status: DC
  Administered 2020-03-12 – 2020-03-15 (×4): 40 mg via ORAL
  Filled 2020-03-12 (×4): qty 1

## 2020-03-12 MED ORDER — ENOXAPARIN SODIUM 40 MG/0.4ML ~~LOC~~ SOLN
40.0000 mg | SUBCUTANEOUS | Status: DC
  Administered 2020-03-12 – 2020-03-13 (×2): 40 mg via SUBCUTANEOUS
  Filled 2020-03-12 (×2): qty 0.4

## 2020-03-12 MED ORDER — VANCOMYCIN HCL 2000 MG/400ML IV SOLN
2000.0000 mg | Freq: Once | INTRAVENOUS | Status: AC
  Administered 2020-03-12: 2000 mg via INTRAVENOUS
  Filled 2020-03-12: qty 400

## 2020-03-12 MED ORDER — KETOROLAC TROMETHAMINE 30 MG/ML IJ SOLN
30.0000 mg | Freq: Once | INTRAMUSCULAR | Status: AC
  Administered 2020-03-12: 30 mg via INTRAVENOUS
  Filled 2020-03-12: qty 1

## 2020-03-12 MED ORDER — VANCOMYCIN HCL IN DEXTROSE 1-5 GM/200ML-% IV SOLN
1000.0000 mg | Freq: Once | INTRAVENOUS | Status: DC
  Filled 2020-03-12: qty 200

## 2020-03-12 MED ORDER — SODIUM CHLORIDE 0.9 % IV SOLN
2.0000 g | Freq: Three times a day (TID) | INTRAVENOUS | Status: DC
  Administered 2020-03-13 – 2020-03-14 (×4): 2 g via INTRAVENOUS
  Filled 2020-03-12 (×6): qty 2

## 2020-03-12 MED ORDER — LAMOTRIGINE 100 MG PO TABS
50.0000 mg | ORAL_TABLET | Freq: Two times a day (BID) | ORAL | Status: DC
  Administered 2020-03-12 – 2020-03-15 (×6): 50 mg via ORAL
  Filled 2020-03-12 (×6): qty 1

## 2020-03-12 MED ORDER — SERTRALINE HCL 50 MG PO TABS
100.0000 mg | ORAL_TABLET | Freq: Every day | ORAL | Status: DC
  Administered 2020-03-12 – 2020-03-14 (×3): 100 mg via ORAL
  Filled 2020-03-12 (×3): qty 2

## 2020-03-12 MED ORDER — VANCOMYCIN HCL IN DEXTROSE 1-5 GM/200ML-% IV SOLN
1000.0000 mg | Freq: Three times a day (TID) | INTRAVENOUS | Status: DC
  Filled 2020-03-12 (×3): qty 200

## 2020-03-12 MED ORDER — CEPHALEXIN 500 MG PO CAPS: 500.0000 mg | ORAL_CAPSULE | Freq: Two times a day (BID) | ORAL | 0 refills | Status: DC

## 2020-03-12 MED ORDER — ROPINIROLE HCL 1 MG PO TABS
0.5000 mg | ORAL_TABLET | Freq: Every day | ORAL | Status: DC
  Administered 2020-03-12 – 2020-03-14 (×3): 0.5 mg via ORAL
  Filled 2020-03-12 (×2): qty 1
  Filled 2020-03-12: qty 2
  Filled 2020-03-12: qty 1

## 2020-03-12 MED ORDER — KETOROLAC TROMETHAMINE 15 MG/ML IJ SOLN
15.0000 mg | Freq: Three times a day (TID) | INTRAMUSCULAR | Status: DC | PRN
  Administered 2020-03-12 – 2020-03-15 (×5): 15 mg via INTRAVENOUS
  Filled 2020-03-12 (×5): qty 1

## 2020-03-12 MED ORDER — BUPROPION HCL ER (XL) 150 MG PO TB24
300.0000 mg | ORAL_TABLET | Freq: Every day | ORAL | Status: DC
  Administered 2020-03-12 – 2020-03-15 (×4): 300 mg via ORAL
  Filled 2020-03-12 (×4): qty 2

## 2020-03-12 MED ORDER — ACETAMINOPHEN 500 MG PO TABS
1000.0000 mg | ORAL_TABLET | Freq: Four times a day (QID) | ORAL | Status: DC | PRN
  Administered 2020-03-12 – 2020-03-13 (×3): 1000 mg via ORAL
  Filled 2020-03-12 (×3): qty 2

## 2020-03-12 MED ORDER — SODIUM CHLORIDE 0.9 % IV BOLUS
1000.0000 mL | Freq: Once | INTRAVENOUS | Status: AC
  Administered 2020-03-12: 1000 mL via INTRAVENOUS

## 2020-03-12 NOTE — H&P (Signed)
at [facility]   PATIENT NAME: Kurt Maldonado    MR#:  [id number]  DATE OF BIRTH:  [birth year]  DATE OF ADMISSION:  03/12/2020  PRIMARY CARE PHYSICIAN: [name], MD   REQUESTING/REFERRING PHYSICIAN: Daryel November, MD  CHIEF COMPLAINT:   Chief Complaint  Patient presents with  . Fever  . Loss of Consciousness    HISTORY OF PRESENT ILLNESS:  Kurt Maldonado  is a 54 y.o. male with a known history of GERD, Parkinson's disease, syncopal episodes, anxiety, depression, positive Covid in December 2020 followed by negative test in March 2021 is being admitted for suspected sepsis.  He was in his usual state of health until going to his doctor's office (Dr Ocie Doyne) today for blood work and he got dizzy and passed out during the blood draw.  Afterward he was noted to have a fever and tachycardia.  Patient notes some cough and dysuria for the past few days as well.  Yesterday he was feeling fine and played a round of golf in hot weather.  While in the ED he had a temperature of 102.6 with a heart rate of 113/min and respiration of 26/min.  No obvious source of infection.  He is being admitted for further evaluation and management. PAST MEDICAL HISTORY:   Past Medical History:  Diagnosis Date  . Anxiety   . Arthritis    hand   . BPH (benign prostatic hyperplasia)   . Collapsed lung    left lung- 1984   . CTS (carpal tunnel syndrome)   . Depression   . Dysrhythmia    pvc's per pt on no medication   . GERD (gastroesophageal reflux disease)   . History of kidney stones   . Hx of basal cell carcinoma 2009   multiple sites  . Migraines   . Parkinson's disease (HCC)   . REM behavioral disorder   . Restless legs syndrome (RLS)   . Syncopal episodes    08/2014 - followed by Dr Sherryll Burger at [place], abnormal EEG- - note 08/20/14 in Care Everywhere )    . Thyroid disease   . Tremor   . Vasovagal episode    PAST SURGICAL HISTORY:   Past Surgical  History:  Procedure Laterality Date  . APPENDECTOMY    . CHOLECYSTECTOMY N/A 08/23/2017   Procedure: LAPAROSCOPIC CHOLECYSTECTOMY WITH INTRAOPERATIVE CHOLANGIOGRAM;  Surgeon: Nadeen Landau, MD;  Location: [facility] ORS;  Service: General;  Laterality: N/A;  . COLONOSCOPY  05/27/2014   adenomatous polyps and FH colon polyp-repeat 5 years per Dr Shelle Iron  . ESOPHAGOGASTRODUODENOSCOPY  05/27/2014   Esophagitis-no repeat per Dr Shelle Iron   . ESOPHAGOGASTRODUODENOSCOPY N/A 06/28/2017   Procedure: ESOPHAGOGASTRODUODENOSCOPY (EGD);  Surgeon: Toledo, Boykin Nearing, MD;  Location: [facility] ENDOSCOPY;  Service: Gastroenterology;  Laterality: N/A;  . MOHS SURGERY  2012   nose, BCC  . PROSTATE BIOPSY N/A 09/08/2014   Procedure: BIOPSY TRANSRECTAL ULTRASONIC PROSTATE (TUBP);  Surgeon: Heloise Purpura, MD;  Location: [facility] ORS;  Service: Urology;  Laterality: N/A;  . SHOULDER SURGERY     bilateral    SOCIAL HISTORY:   Social History   Tobacco Use  . Smoking status: Never Smoker  . Smokeless tobacco: Never Used  Substance Use Topics  . Alcohol use: Yes    Comment: rare- 2 beers per month    FAMILY HISTORY:   Family History  Problem Relation Age of Onset  . Dementia Mother   . Cataracts Mother   . Osteoporosis Mother   .  Arthritis Mother   . Alzheimer's disease Mother   . High blood pressure Mother   . Migraines Mother   . Hypertension Father   . Kidney cancer Father   . Diabetes Mellitus II Father   . Prostate cancer Father   . Colon polyps Father   . Graves' disease Other   . Parkinson's disease Paternal Grandmother   . Tremor Paternal Grandmother   . Colon cancer Neg Hx    DRUG ALLERGIES:   Allergies  Allergen Reactions  . Codeine Nausea And Vomiting and Other (See Comments)    Muscle tightness    REVIEW OF SYSTEMS:  Review of Systems  Constitutional: Positive for chills and fever. Negative for diaphoresis, malaise/fatigue and weight loss.  HENT: Negative for ear discharge, ear pain, hearing  loss, nosebleeds, sore throat and tinnitus.   Eyes: Negative for blurred vision and pain.  Respiratory: Positive for cough. Negative for hemoptysis, shortness of breath and wheezing.   Cardiovascular: Negative for chest pain, palpitations, orthopnea and leg swelling.  Gastrointestinal: Negative for abdominal pain, blood in stool, constipation, diarrhea, heartburn, nausea and vomiting.  Genitourinary: Positive for dysuria. Negative for frequency and urgency.  Musculoskeletal: Negative for back pain and myalgias.  Skin: Negative for itching and rash.  Neurological: Positive for dizziness. Negative for tingling, tremors, focal weakness, seizures, weakness and headaches.  Psychiatric/Behavioral: Negative for depression. The patient is not nervous/anxious.    MEDICATIONS AT HOME:   Prior to Admission medications   Medication Sig Start Date End Date Taking? Authorizing Provider  buPROPion (WELLBUTRIN XL) 300 MG 24 hr tablet TAKE 1 TABLET BY MOUTH EVERY DAY 09/13/19  Yes [name], MD  lamoTRIgine (LAMICTAL) 25 MG tablet Take 50 mg by mouth 2 (two) times daily. 01/22/20  Yes [provider]  mupirocin ointment (BACTROBAN) 2 % SMARTSIG:1 Application Topical 2-3 Times Daily 02/07/20  Yes [provider]  OMEPRAZOLE PO Take 80 mg by mouth daily.   Yes [provider]  rOPINIRole (REQUIP) 0.5 MG tablet Take 0.5 mg by mouth at bedtime.   Yes [provider]  sertraline (ZOLOFT) 100 MG tablet TAKE 1 TABLET (100 MG TOTAL) BY MOUTH DAILY. TAKE 2 DAILY STARTING 12/20/18. Patient taking differently: Take 100 mg by mouth in the morning and at bedtime.  10/16/19  Yes [name], MD  azithromycin (ZITHROMAX) 250 MG tablet Take 250 mg by mouth daily. 03/12/20   [provider]  cephALEXin (KEFLEX) 500 MG capsule Take 1 capsule (500 mg total) by mouth 2 (two) times daily. 03/12/20   Sharman Cheek, MD  predniSONE (STERAPRED UNI-PAK 21 TAB) 10 MG  (21) TBPK tablet 6-5-4-3-2-1 tapering dose. 09/11/19   [name], MD    VITAL SIGNS:  Blood pressure 127/72, pulse 97, temperature 100.2 F (37.9 C), temperature source Oral, resp. rate 18, height 6\' 2"  (1.88 m), weight 92.1 kg, SpO2 98 %. PHYSICAL EXAMINATION:  Physical Exam  GENERAL:  54 y.o.-year-old patient lying in the bed with no acute distress.  EYES: Pupils equal, round, reactive to light and accommodation. No scleral icterus. Extraocular muscles intact.  HEENT: Head atraumatic, normocephalic. Oropharynx and nasopharynx clear.  He has some raspy voice and clear trying to clear the throat, he feels it is allergy symptoms NECK:  Supple, no jugular venous distention. No thyroid enlargement, no tenderness.  LUNGS: Normal breath sounds bilaterally, no wheezing, rales,rhonchi or crepitation. No use of accessory muscles of respiration.  CARDIOVASCULAR: S1, S2 normal.  No murmurs, rubs, or gallops.  ABDOMEN: Soft, nontender, nondistended. Bowel sounds present. No organomegaly or mass.  EXTREMITIES: No pedal edema, cyanosis, or clubbing.  NEUROLOGIC: Cranial nerves II through XII are intact. Muscle strength 5/5 in all extremities. Sensation intact. Gait not checked.  PSYCHIATRIC: The patient is alert and oriented x 3.  SKIN: No obvious rash, lesion, or ulcer.  LABORATORY PANEL:   CBC Recent Labs  Lab 03/12/20 1047  WBC 16.7*  HGB 14.8  HCT 41.4  PLT 358   ------------------------------------------------------------------------------------------------------------------  Chemistries  Recent Labs  Lab 03/12/20 1047  NA 134*  K 4.2  CL 99  CO2 24  GLUCOSE 105*  BUN 19  CREATININE 0.94  CALCIUM 8.9  AST 65*  ALT 70*  ALKPHOS 139*  BILITOT 0.7   ------------------------------------------------------------------------------------------------------------------  Cardiac Enzymes No results for input(s): TROPONINI in the last 168  hours. ------------------------------------------------------------------------------------------------------------------  RADIOLOGY:  DG Chest 2 View  Result Date: 03/12/2020 CLINICAL DATA:  Fever, tachycardia, shortness of breath EXAM: CHEST - 2 VIEW COMPARISON:  09/04/2017 FINDINGS: The heart size and mediastinal contours are within normal limits. Both lungs are clear. The visualized skeletal structures are unremarkable. IMPRESSION: No acute abnormality of the lungs. Electronically Signed   By: Lauralyn Primes M.D.   On: 03/12/2020 12:09   CT Renal Stone Study  Result Date: 03/12/2020 CLINICAL DATA:  Difficulty urinating, flank pain EXAM: CT ABDOMEN AND PELVIS WITHOUT CONTRAST TECHNIQUE: Multidetector CT imaging of the abdomen and pelvis was performed following the standard protocol without IV contrast. COMPARISON:  11/05/2004 FINDINGS: Lower chest: No acute pleural or parenchymal lung disease. Hepatobiliary: No focal liver abnormality is seen. Status post cholecystectomy. No biliary dilatation. Pancreas: Unremarkable. No pancreatic ductal dilatation or surrounding inflammatory changes. Spleen: Normal in size without focal abnormality. Adrenals/Urinary Tract: 1.5 cm cyst lower pole right kidney. No urinary tract calculi or obstructive uropathy. Bladder is moderately distended without focal abnormality. The adrenals are normal. Stomach/Bowel: No bowel obstruction or ileus. The appendix is surgically absent. No bowel wall thickening or inflammatory change. 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 Minimal atherosclerosis of the aorta. No pathologic adenopathy. Reproductive: Prostate is unremarkable. Other: No abdominal wall hernia or abnormality. No abdominopelvic ascites. Musculoskeletal: No acute or destructive bony lesions. Reconstructed images demonstrate no additional findings. IMPRESSION: 1. No urinary tract calculi or obstructive  uropathy. 2. No acute intra-abdominal or intrapelvic process. 3. Aortic Atherosclerosis (ICD10-I70.0). Electronically Signed   By: Sharlet Salina M.D.   On: 03/12/2020 15:16      IMPRESSION AND PLAN:  54 year old male being admitted for suspected sepsis  *Sepsis present on admission -Fever, leukocytosis, tachycardia, tachypnea.  No clear source of infection -Chest x-ray and CT renal stone study negative for any acute pathology -We will obtain blood culture, repeat Covid -Start empiric antibiotic with cefepime and vancomycin  Thyroid abnormalities Per patient he has been working up with Dr. Shaune Pascal for his thyroid problem and likely going to have a biopsy of 2 of his thyroid nodule in near future We will check TSH  Anxiety and depression Continue home medications  Dizziness Likely from dehydration from being outside in the heat yesterday for golfing We will start aggressive IV hydration  All the records are reviewed and case discussed with ED provider. Management plans discussed with the patient, nursing and they are in agreement.  CODE STATUS: Full code  TOTAL TIME TAKING CARE OF THIS PATIENT: 45 minutes.  Max Sane M.D on 03/12/2020 at 5:49 PM  [facility]   CC: Primary care physician; Jerrol Banana., MD   Note: This dictation was prepared with Dragon dictation along with smaller phrase technology. Any transcriptional errors that result from this process are unintentional.

## 2020-03-12 NOTE — ED Notes (Signed)
This RN attempted to provide report to room 260 w/o success.

## 2020-03-12 NOTE — ED Triage Notes (Signed)
Pt presents to ED via [facility] with c/o syncopal episode. Pt states went to Dr. Abagail Kitchens office for bloodwork and had a syncopal episode with unknown down time. Pt presents tachycardic, tachypneic, febril upon arrival. Pt states fever started after syncopal episode. Pt also c/o severe dizziness. Pt states he feels like he is going to pass out again in triage, requesting recliner to lay in.

## 2020-03-12 NOTE — ED Notes (Signed)
This [facility] to  Verify meds.

## 2020-03-12 NOTE — ED Notes (Signed)
Assigned bed @ [id number], spoke with RN Lenord Fellers.

## 2020-03-12 NOTE — ED Notes (Signed)
Pt provided with dinner tray  And fluids at this time.

## 2020-03-12 NOTE — ED Notes (Signed)
MD Vipul at bedside.

## 2020-03-12 NOTE — ED Provider Notes (Signed)
Patient had presented with fever, rigors and syncope.  No specific etiology has been discovered.  Patient states he feels dizzy still.  I will discuss with the hospitalist for observation.   Emily Filbert, MD 03/12/20 1726

## 2020-03-12 NOTE — ED Provider Notes (Signed)
[facility] Emergency Department Provider Note  ____________________________________________  Time seen: Approximately 4:29 PM  I have reviewed the triage vital signs and the nursing notes.   HISTORY  Chief Complaint Fever and Loss of Consciousness    HPI Kurt Maldonado is a 54 y.o. male with a history of GERD, Parkinson's disease, syncopal episodes  who was in his usual state of health until going to his doctor's office today for blood work and he got dizzy and passed out during the blood draw.  Afterward he was noted to have a fever and tachycardia.  Patient notes dysuria for the past few days as well.  Yesterday he was feeling fine and played a round of golf in hot weather.     Past Medical History:  Diagnosis Date  . Anxiety   . Arthritis    hand   . BPH (benign prostatic hyperplasia)   . Collapsed lung    left lung- 1984   . CTS (carpal tunnel syndrome)   . Depression   . Dysrhythmia    pvc's per pt on no medication   . GERD (gastroesophageal reflux disease)   . History of kidney stones   . Hx of basal cell carcinoma 2009   multiple sites  . Migraines   . Parkinson's disease (HCC)   . REM behavioral disorder   . Restless legs syndrome (RLS)   . Syncopal episodes    08/2014 - followed by Dr Sherryll Burger at [place], abnormal EEG- - note 08/20/14 in Care Everywhere )    . Thyroid disease   . Tremor   . Vasovagal episode      Patient Active Problem List   Diagnosis Date Noted  . Anxiety 12/03/2018  . Insomnia 04/04/2016  . Parkinson's disease (HCC) 08/20/2014  . Benign fibroma of prostate 07/31/2014  . Clinical depression 07/31/2014  . Acid reflux 07/31/2014  . Calculus of kidney 07/31/2014  . Headache, migraine 07/31/2014  . Neurocardiogenic syncope 07/31/2014  . Gastro-esophageal reflux disease without esophagitis 05/14/2014  . Bleeding per rectum 05/14/2014  . Beat, premature ventricular 04/08/2014  . Atrial fibrillation and  flutter (HCC) 04/08/2014  . BP (high blood pressure) 03/25/2014  . Awareness of heartbeats 03/25/2014  . NS (nuclear sclerosis) 02/05/2014  . Atelectasis 08/04/1983     Past Surgical History:  Procedure Laterality Date  . APPENDECTOMY    . CHOLECYSTECTOMY N/A 08/23/2017   Procedure: LAPAROSCOPIC CHOLECYSTECTOMY WITH INTRAOPERATIVE CHOLANGIOGRAM;  Surgeon: Nadeen Landau, MD;  Location: [facility] ORS;  Service: General;  Laterality: N/A;  . COLONOSCOPY  05/27/2014   adenomatous polyps and FH colon polyp-repeat 5 years per Dr Shelle Iron  . ESOPHAGOGASTRODUODENOSCOPY  05/27/2014   Esophagitis-no repeat per Dr Shelle Iron   . ESOPHAGOGASTRODUODENOSCOPY N/A 06/28/2017   Procedure: ESOPHAGOGASTRODUODENOSCOPY (EGD);  Surgeon: Toledo, Boykin Nearing, MD;  Location: [facility] ENDOSCOPY;  Service: Gastroenterology;  Laterality: N/A;  . MOHS SURGERY  2012   nose, BCC  . PROSTATE BIOPSY N/A 09/08/2014   Procedure: BIOPSY TRANSRECTAL ULTRASONIC PROSTATE (TUBP);  Surgeon: Heloise Purpura, MD;  Location: [facility] ORS;  Service: Urology;  Laterality: N/A;  . SHOULDER SURGERY     bilateral      Prior to Admission medications   Medication Sig Start Date End Date Taking? Authorizing Provider  buPROPion (WELLBUTRIN XL) 300 MG 24 hr tablet TAKE 1 TABLET BY MOUTH EVERY DAY 09/13/19  Yes [name], MD  lamoTRIgine (LAMICTAL) 25 MG tablet Take 50 mg by mouth 2 (two) times  daily. 01/22/20  Yes [provider]  mupirocin ointment (BACTROBAN) 2 % SMARTSIG:1 Application Topical 2-3 Times Daily 02/07/20  Yes [provider]  OMEPRAZOLE PO Take 80 mg by mouth daily.   Yes [provider]  rOPINIRole (REQUIP) 0.5 MG tablet Take 0.5 mg by mouth at bedtime.   Yes [provider]  sertraline (ZOLOFT) 100 MG tablet TAKE 1 TABLET (100 MG TOTAL) BY MOUTH DAILY. TAKE 2 DAILY STARTING 12/20/18. Patient taking differently: Take 100 mg by mouth in the morning and at bedtime.  10/16/19  Yes Jerrol Banana., MD  azithromycin (ZITHROMAX) 250 MG tablet Take 250 mg by mouth daily. 03/12/20   [provider]  cephALEXin (KEFLEX) 500 MG capsule Take 1 capsule (500 mg total) by mouth 2 (two) times daily. 03/12/20   Carrie Mew, MD  predniSONE (STERAPRED UNI-PAK 21 TAB) 10 MG (21) TBPK tablet 6-5-4-3-2-1 tapering dose. 09/11/19   Jerrol Banana., MD     Allergies Codeine   Family History  Problem Relation Age of Onset  . Dementia Mother   . Cataracts Mother   . Osteoporosis Mother   . Arthritis Mother   . Alzheimer's disease Mother   . High blood pressure Mother   . Migraines Mother   . Hypertension Father   . Kidney cancer Father   . Diabetes Mellitus II Father   . Prostate cancer Father   . Colon polyps Father   . Graves' disease Other   . Parkinson's disease Paternal Grandmother   . Tremor Paternal Grandmother   . Colon cancer Neg Hx     Social History Social History   Tobacco Use  . Smoking status: Never Smoker  . Smokeless tobacco: Never Used  Vaping Use  . Vaping Use: Never used  Substance Use Topics  . Alcohol use: Yes    Comment: rare- 2 beers per month   . Drug use: No    Review of Systems  Constitutional:   No fever or chills.  ENT:   No sore throat. No rhinorrhea. Cardiovascular:   No chest pain, positive syncope. Respiratory:   No dyspnea or cough. Gastrointestinal:   Positive for suprapubic pain, negative for vomiting and diarrhea.  Musculoskeletal:   Negative for focal pain or swelling All other systems reviewed and are negative except as documented above in ROS and HPI.  ____________________________________________   PHYSICAL EXAM:  VITAL SIGNS: ED Triage Vitals  Enc Vitals Group     BP 03/12/20 1041 123/73     Pulse Rate 03/12/20 1041 (!) 113     Resp 03/12/20 1041 (!) 26     Temp 03/12/20 1041 (!) 102.6 F (39.2 C)     Temp Source 03/12/20 1041 Oral     SpO2 03/12/20 1041 98 %     Weight 03/12/20 1047 203 lb (92.1 kg)      Height 03/12/20 1047 6\' 2"  (1.88 m)     Head Circumference --      Peak Flow --      Pain Score 03/12/20 1047 0     Pain Loc --      Pain Edu? --      Excl. in [employer]? --     Vital signs reviewed, nursing assessments reviewed.   Constitutional:   Alert and oriented. Non-toxic appearance. Eyes:   Conjunctivae are normal. EOMI. PERRL. ENT      Head:   Normocephalic and atraumatic.      Nose: Normal.  Mouth/Throat: Dry mucous membranes.      Neck:   No meningismus. Full ROM. Hematological/Lymphatic/Immunilogical:   No cervical lymphadenopathy. Cardiovascular:   Tachycardia heart rate 110. Symmetric bilateral radial and DP pulses.  No murmurs. Cap refill less than 2 seconds. Respiratory:   Normal respiratory effort without tachypnea/retractions. Breath sounds are clear and equal bilaterally. No wheezes/rales/rhonchi. Gastrointestinal:   Soft with suprapubic tenderness. Non distended. There is no CVA tenderness.  No rebound, rigidity, or guarding.  Musculoskeletal:   Normal range of motion in all extremities. No joint effusions.  No lower extremity tenderness.  No edema. Neurologic:   Normal speech and language.  Motor grossly intact. No acute focal neurologic deficits are appreciated.  Skin:    Skin is warm, dry and intact. No rash noted.  No petechiae, purpura, or bullae.  ____________________________________________    LABS (pertinent positives/negatives) (all labs ordered are listed, but only abnormal results are displayed) Labs Reviewed  COMPREHENSIVE METABOLIC PANEL - Abnormal; Notable for the following components:      Result Value   Sodium 134 (*)    Glucose, Bld 105 (*)    AST 65 (*)    ALT 70 (*)    Alkaline Phosphatase 139 (*)    All other components within normal limits  CBC WITH DIFFERENTIAL/PLATELET - Abnormal; Notable for the following components:   WBC 16.7 (*)    Neutro Abs 14.6 (*)    Abs Immature Granulocytes 0.08 (*)    All other components within  normal limits  URINE CULTURE  CULTURE, BLOOD (SINGLE)  LACTIC ACID, PLASMA  LACTIC ACID, PLASMA  APTT  PROTIME-INR  GLUCOSE, CAPILLARY  URINALYSIS, ROUTINE W REFLEX MICROSCOPIC   ____________________________________________   EKG  Interpreted by me Sinus tachycardia rate 118.  Normal axis intervals QRS ST segments and T waves.  ____________________________________________    RADIOLOGY  DG Chest 2 View  Result Date: 03/12/2020 CLINICAL DATA:  Fever, tachycardia, shortness of breath EXAM: CHEST - 2 VIEW COMPARISON:  09/04/2017 FINDINGS: The heart size and mediastinal contours are within normal limits. Both lungs are clear. The visualized skeletal structures are unremarkable. IMPRESSION: No acute abnormality of the lungs. Electronically Signed   By: Lauralyn Primes M.D.   On: 03/12/2020 12:09   CT Renal Stone Study  Result Date: 03/12/2020 CLINICAL DATA:  Difficulty urinating, flank pain EXAM: CT ABDOMEN AND PELVIS WITHOUT CONTRAST TECHNIQUE: Multidetector CT imaging of the abdomen and pelvis was performed following the standard protocol without IV contrast. COMPARISON:  11/05/2004 FINDINGS: Lower chest: No acute pleural or parenchymal lung disease. Hepatobiliary: No focal liver abnormality is seen. Status post cholecystectomy. No biliary dilatation. Pancreas: Unremarkable. No pancreatic ductal dilatation or surrounding inflammatory changes. Spleen: Normal in size without focal abnormality. Adrenals/Urinary Tract: 1.5 cm cyst lower pole right kidney. No urinary tract calculi or obstructive uropathy. Bladder is moderately distended without focal abnormality. The adrenals are normal. Stomach/Bowel: No bowel obstruction or ileus. The appendix is surgically absent. No bowel wall thickening or inflammatory change. 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 Minimal atherosclerosis of the aorta. No pathologic adenopathy.  Reproductive: Prostate is unremarkable. Other: No abdominal wall hernia or abnormality. No abdominopelvic ascites. Musculoskeletal: No acute or destructive bony lesions. Reconstructed images demonstrate no additional findings. IMPRESSION: 1. No urinary tract calculi or obstructive uropathy. 2. No acute intra-abdominal or intrapelvic process. 3. Aortic Atherosclerosis (ICD10-I70.0). Electronically Signed   By: Sharlet Salina M.D.   On: 03/12/2020  15:16    ____________________________________________   PROCEDURES Procedures  ____________________________________________  DIFFERENTIAL DIAGNOSIS   Cystitis, pyelonephritis, ureteral obstruction, diverticulitis, appendicitis  CLINICAL IMPRESSION / ASSESSMENT AND PLAN / ED COURSE  Medications ordered in the ED: Medications  sodium chloride 0.9 % bolus 1,000 mL (0 mLs Intravenous Stopped 03/12/20 1310)  sodium chloride 0.9 % bolus 1,000 mL (1,000 mLs Intravenous New Bag/Given 03/12/20 1517)  cefTRIAXone (ROCEPHIN) 1 g in sodium chloride 0.9 % 100 mL IVPB (1 g Intravenous New Bag/Given 03/12/20 1517)    Pertinent labs & imaging results that were available during my care of the patient were reviewed by me and considered in my medical decision making (see chart for details).  Kurt Maldonado was evaluated in Emergency Department on 03/12/2020 for the symptoms described in the history of present illness. He was evaluated in the context of the global COVID-19 pandemic, which necessitated consideration that the patient might be at risk for infection with the SARS-CoV-2 virus that causes COVID-19. Institutional protocols and algorithms that pertain to the evaluation of patients at risk for COVID-19 are in a state of rapid change based on information released by regulatory bodies including the CDC and federal and state organizations. These policies and algorithms were followed during the patient's care in the ED.   Patient presents with fever tachycardia,  appearing dehydrated on exam.  Symptoms suggestive of cystitis, but he is nontoxic and not septic.  Patient given IV fluids in the ED, had a large amount of urine output.  Given IV ceftriaxone as well.  I will start him on Keflex.  He is feeling better after hydration and suitable for outpatient follow-up.  CT scan of the abdomen pelvis is unremarkable, chest x-ray negative, no other acute infectious source.  Lactate is normal.      ____________________________________________   FINAL CLINICAL IMPRESSION(S) / ED DIAGNOSES    Final diagnoses:  Fever, unspecified fever cause  Dehydration  Syncope and collapse     ED Discharge Orders         Ordered    cephALEXin (KEFLEX) 500 MG capsule  2 times daily     Discontinue  Reprint     03/12/20 1629          Portions of this note were generated with dragon dictation software. Dictation errors may occur despite best attempts at proofreading.   Sharman Cheek, MD 03/12/20 [phone]

## 2020-03-12 NOTE — ED Notes (Signed)
Pt provided with sandwich tray and fluids.

## 2020-03-12 NOTE — Consult Note (Signed)
Pharmacy Antibiotic Note  Kurt Maldonado is Kurt 54 y.o. male admitted on 03/12/2020 with sepsis.  Pharmacy has been consulted for Vancomycin and Cefepime dosing. Patient also received 1 dose of Ceftriaxone in ED and is on Flagyl Q8 hours.  Plan: 1) Vancomycin 2g IV x 1 as loading dose, followed by: Vancomycin 1g IV Q8 hours per [facility] Nomogram  2) Cefepime 2g IV Q8 hours   Height: 6\' 2"  (188 cm) Weight: 93.4 kg (206 lb) IBW/kg (Calculated) : 82.2  Temp (24hrs), Avg:99.9 F (37.7 C), Min:98.6 F (37 C), Max:102.6 F (39.2 C)  Recent Labs  Lab 03/12/20 1047 03/12/20 1317  WBC 16.7*  --   CREATININE 0.94  --   LATICACIDVEN 1.2 1.0    Estimated Creatinine Clearance: 103.2 mL/min (by C-G formula based on SCr of 0.94 mg/dL).    Allergies  Allergen Reactions  . Codeine Nausea And Vomiting and Other (See Comments)    Muscle tightness     Antimicrobials this admission: Vancomycin/Cefepime/Flagyl 1/10 >>  Rocephin 6/10 x1  Microbiology results: 6/10 BCx: pending 6/10 UCx: pending   Thank you for allowing pharmacy to be Kurt part of this patient's care.  8/10 Kurt Maldonado 03/12/2020 8:45 PM

## 2020-03-13 ENCOUNTER — Inpatient Hospital Stay: Payer: BC Managed Care – PPO

## 2020-03-13 DIAGNOSIS — E0591 Thyrotoxicosis, unspecified with thyrotoxic crisis or storm: Secondary | ICD-10-CM | POA: Diagnosis not present

## 2020-03-13 DIAGNOSIS — R519 Headache, unspecified: Secondary | ICD-10-CM | POA: Diagnosis not present

## 2020-03-13 DIAGNOSIS — A419 Sepsis, unspecified organism: Secondary | ICD-10-CM | POA: Diagnosis not present

## 2020-03-13 LAB — CBC
HCT: 32.8 % — ABNORMAL LOW (ref 39.0–52.0)
Hemoglobin: 11.3 g/dL — ABNORMAL LOW (ref 13.0–17.0)
MCH: 33.4 pg (ref 26.0–34.0)
MCHC: 34.5 g/dL (ref 30.0–36.0)
MCV: 97 fL (ref 80.0–100.0)
Platelets: 271 10*3/uL (ref 150–400)
RBC: 3.38 MIL/uL — ABNORMAL LOW (ref 4.22–5.81)
RDW: 12.5 % (ref 11.5–15.5)
WBC: 9.8 10*3/uL (ref 4.0–10.5)
nRBC: 0 % (ref 0.0–0.2)

## 2020-03-13 LAB — BASIC METABOLIC PANEL
Anion gap: 7 (ref 5–15)
BUN: 14 mg/dL (ref 6–20)
CO2: 21 mmol/L — ABNORMAL LOW (ref 22–32)
Calcium: 7.8 mg/dL — ABNORMAL LOW (ref 8.9–10.3)
Chloride: 109 mmol/L (ref 98–111)
Creatinine, Ser: 0.73 mg/dL (ref 0.61–1.24)
GFR calc Af Amer: >60 mL/min (ref >60)
GFR calc non Af Amer: >60 mL/min (ref >60)
Glucose, Bld: 93 mg/dL (ref 70–99)
Potassium: 3.7 mmol/L (ref 3.5–5.1)
Sodium: 137 mmol/L (ref 135–145)

## 2020-03-13 LAB — CORTISOL-AM, BLOOD: Cortisol - AM: 10.1 ug/dL (ref 6.7–22.6)

## 2020-03-13 LAB — T4, FREE: Free T4: 1.63 ng/dL — ABNORMAL HIGH (ref 0.61–1.12)

## 2020-03-13 LAB — PROTIME-INR
INR: 1.1 (ref 0.8–1.2)
Prothrombin Time: 14.1 seconds (ref 11.4–15.2)

## 2020-03-13 LAB — PROCALCITONIN: Procalcitonin: 0.12 ng/mL

## 2020-03-13 MED ORDER — HYDROCODONE-ACETAMINOPHEN 7.5-325 MG PO TABS
1.0000 | ORAL_TABLET | Freq: Once | ORAL | Status: AC
  Administered 2020-03-13: 1 via ORAL
  Filled 2020-03-13: qty 1

## 2020-03-13 MED ORDER — BUTALBITAL-APAP-CAFFEINE 50-325-40 MG PO TABS
1.0000 | ORAL_TABLET | ORAL | Status: DC | PRN
  Administered 2020-03-13 – 2020-03-15 (×5): 1 via ORAL
  Filled 2020-03-13 (×6): qty 1

## 2020-03-13 MED ORDER — TEMAZEPAM 15 MG PO CAPS
30.0000 mg | ORAL_CAPSULE | Freq: Every evening | ORAL | Status: DC | PRN
  Administered 2020-03-13 – 2020-03-14 (×3): 30 mg via ORAL
  Filled 2020-03-13 (×3): qty 2

## 2020-03-13 MED ORDER — BUTALBITAL-APAP-CAFFEINE 50-325-40 MG PO TABS
1.0000 | ORAL_TABLET | Freq: Once | ORAL | Status: AC
  Administered 2020-03-13: 1 via ORAL
  Filled 2020-03-13: qty 1

## 2020-03-13 MED ORDER — KETOROLAC TROMETHAMINE 30 MG/ML IJ SOLN
30.0000 mg | Freq: Once | INTRAMUSCULAR | Status: AC
  Administered 2020-03-13: 30 mg via INTRAVENOUS
  Filled 2020-03-13: qty 1

## 2020-03-13 MED ORDER — VANCOMYCIN HCL IN DEXTROSE 1-5 GM/200ML-% IV SOLN
1000.0000 mg | Freq: Three times a day (TID) | INTRAVENOUS | Status: DC
  Administered 2020-03-13 – 2020-03-14 (×4): 1000 mg via INTRAVENOUS
  Filled 2020-03-13 (×6): qty 200

## 2020-03-13 MED ORDER — PHENAZOPYRIDINE HCL 200 MG PO TABS
200.0000 mg | ORAL_TABLET | Freq: Three times a day (TID) | ORAL | Status: AC | PRN
  Administered 2020-03-14 – 2020-03-15 (×2): 200 mg via ORAL
  Filled 2020-03-13 (×4): qty 1

## 2020-03-13 NOTE — Consult Note (Signed)
Pharmacy Antibiotic Note  Kurt Maldonado is a 54 y.o. male admitted on 03/12/2020 with sepsis.  Pharmacy has been consulted for Vancomycin and Cefepime dosing. No source of infection identified.   Plan: 1) Vancomycin 2g IV x 1 as loading dose, followed by: Vancomycin 1g IV Q8 hours per  Nomogram. Pt was loaded a little less than 25 mg/kg. Will wait until 6/13 for levels.   2) Cefepime 2g IV Q8 hours   3) on flagyl 500mg  q8H.   Height: 6\' 2"  (188 cm) Weight: 93.4 kg (205 lb 14.6 oz) IBW/kg (Calculated) : 82.2  Temp (24hrs), Avg:99.4 F (37.4 C), Min:97.7 F (36.5 C), Max:102.6 F (39.2 C)  Recent Labs  Lab 03/12/20 1047 03/12/20 1317  WBC 16.7*  --   CREATININE 0.94  --   LATICACIDVEN 1.2 1.0    Estimated Creatinine Clearance: 103.2 mL/min (by C-G formula based on SCr of 0.94 mg/dL).    Allergies  Allergen Reactions  . Codeine Nausea And Vomiting and Other (See Comments)    Muscle tightness     Antimicrobials this admission: Vancomycin/Cefepime/Flagyl 1/10 >>  Rocephin 6/10 x1  Microbiology results: 6/10 BCx: pending 6/10 UCx: pending   Thank you for allowing pharmacy to be a part of this patient's care.  8/10, PharmD, BCPS 03/13/2020 7:52 AM

## 2020-03-13 NOTE — Progress Notes (Signed)
Pt temp was 103 orally during shift hand off, pt stated still having HA, face is flushed and complaining of neck pain. Pt Stated Narco taken early did not help with the HA. Hand off to Sonia Side RN and night shift nurse is notifying  MD on call DR,  Linton Flemings now.

## 2020-03-13 NOTE — Plan of Care (Signed)
Reviewed plan of care and rounded with Dr. Sherryll Burger. Patient understanding hospital course and plan of care.

## 2020-03-13 NOTE — Progress Notes (Signed)
[facility] NAME: Kurt Maldonado    MR#:  [id number]  DATE OF BIRTH:  07/10/1966  SUBJECTIVE:  CHIEF COMPLAINT:   Chief Complaint  Patient presents with  . Fever  . Loss of Consciousness   Headache REVIEW OF SYSTEMS:  Review of Systems  Constitutional: Negative for diaphoresis, fever, malaise/fatigue and weight loss.  HENT: Negative for ear discharge, ear pain, hearing loss, nosebleeds, sore throat and tinnitus.   Eyes: Negative for blurred vision and pain.  Respiratory: Negative for cough, hemoptysis, shortness of breath and wheezing.   Cardiovascular: Negative for chest pain, palpitations, orthopnea and leg swelling.  Gastrointestinal: Negative for abdominal pain, blood in stool, constipation, diarrhea, heartburn, nausea and vomiting.  Genitourinary: Negative for dysuria, frequency and urgency.  Musculoskeletal: Negative for back pain and myalgias.  Skin: Negative for itching and rash.  Neurological: Positive for headaches. Negative for dizziness, tingling, tremors, focal weakness, seizures and weakness.  Psychiatric/Behavioral: Negative for depression. The patient is not nervous/anxious.    DRUG ALLERGIES:   Allergies  Allergen Reactions  . Codeine Nausea And Vomiting and Other (See Comments)    Muscle tightness    VITALS:  Blood pressure 135/72, pulse 68, temperature 98.6 F (37 C), resp. rate 17, height 6\' 2"  (1.88 m), weight 93.4 kg, SpO2 98 %. PHYSICAL EXAMINATION:  Physical Exam HENT:     Head: Normocephalic and atraumatic.  Eyes:     Conjunctiva/sclera: Conjunctivae normal.     Pupils: Pupils are equal, round, and reactive to light.  Neck:     Thyroid: No thyromegaly.     Trachea: No tracheal deviation.  Cardiovascular:     Rate and Rhythm: Normal rate and regular rhythm.     Heart sounds: Normal heart sounds.  Pulmonary:     Effort: Pulmonary effort is normal. No respiratory distress.     Breath sounds: Normal  breath sounds. No wheezing.  Chest:     Chest wall: No tenderness.  Abdominal:     General: Bowel sounds are normal. There is no distension.     Palpations: Abdomen is soft.     Tenderness: There is no abdominal tenderness.  Musculoskeletal:        General: Normal range of motion.     Cervical back: Normal range of motion and neck supple.  Skin:    General: Skin is warm and dry.     Findings: No rash.  Neurological:     Mental Status: He is alert and oriented to person, place, and time.     Cranial Nerves: No cranial nerve deficit.    LABORATORY PANEL:  Male CBC Recent Labs  Lab 03/13/20 0531  WBC 9.8  HGB 11.3*  HCT 32.8*  PLT 271   ------------------------------------------------------------------------------------------------------------------ Chemistries  Recent Labs  Lab 03/12/20 1047 03/12/20 1047 03/13/20 0531  NA 134*   < > 137  K 4.2   < > 3.7  CL 99   < > 109  CO2 24   < > 21*  GLUCOSE 105*   < > 93  BUN 19   < > 14  CREATININE 0.94   < > 0.73  CALCIUM 8.9   < > 7.8*  AST 65*  --   --   ALT 70*  --   --   ALKPHOS 139*  --   --   BILITOT 0.7  --   --    < > =  values in this interval not displayed.   RADIOLOGY:  CT HEAD WO CONTRAST  Result Date: 03/13/2020 CLINICAL DATA:  54 year old male with history of headache and dizziness. Fever and tachycardia. EXAM: CT HEAD WITHOUT CONTRAST TECHNIQUE: Contiguous axial images were obtained from the base of the skull through the vertex without intravenous contrast. COMPARISON:  No priors. FINDINGS: Brain: Patchy areas of decreased attenuation are noted throughout the periventricular white matter of the cerebral hemispheres bilaterally, compatible with chronic microvascular ischemic disease. No evidence of acute infarction, hemorrhage, hydrocephalus, extra-axial collection or mass lesion/mass effect. Vascular: No hyperdense vessel or unexpected calcification. Skull: Normal. Negative for fracture or focal lesion.  Sinuses/Orbits: No acute finding. Other: None. IMPRESSION: 1. No acute intracranial abnormalities. 2. Mild chronic microvascular ischemic changes in the periventricular white matter of the cerebral hemispheres bilaterally. Electronically Signed   By: Trudie Reed M.D.   On: 03/13/2020 12:20   CT Renal Stone Study  Result Date: 03/12/2020 CLINICAL DATA:  Difficulty urinating, flank pain EXAM: CT ABDOMEN AND PELVIS WITHOUT CONTRAST TECHNIQUE: Multidetector CT imaging of the abdomen and pelvis was performed following the standard protocol without IV contrast. COMPARISON:  11/05/2004 FINDINGS: Lower chest: No acute pleural or parenchymal lung disease. Hepatobiliary: No focal liver abnormality is seen. Status post cholecystectomy. No biliary dilatation. Pancreas: Unremarkable. No pancreatic ductal dilatation or surrounding inflammatory changes. Spleen: Normal in size without focal abnormality. Adrenals/Urinary Tract: 1.5 cm cyst lower pole right kidney. No urinary tract calculi or obstructive uropathy. Bladder is moderately distended without focal abnormality. The adrenals are normal. Stomach/Bowel: No bowel obstruction or ileus. The appendix is surgically absent. No bowel wall thickening or inflammatory change. 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 Minimal atherosclerosis of the aorta. No pathologic adenopathy. Reproductive: Prostate is unremarkable. Other: No abdominal wall hernia or abnormality. No abdominopelvic ascites. Musculoskeletal: No acute or destructive bony lesions. Reconstructed images demonstrate no additional findings. IMPRESSION: 1. No urinary tract calculi or obstructive uropathy. 2. No acute intra-abdominal or intrapelvic process. 3. Aortic Atherosclerosis (ICD10-I70.0). Electronically Signed   By: Sharlet Salina M.D.   On: 03/12/2020 15:16   ASSESSMENT AND PLAN:   54 year old male being admitted for suspected  sepsis  *Sepsis present on admission -Fever, leukocytosis, tachycardia, tachypnea.  No clear source of infection -Chest x-ray and CT renal stone study negative for any acute pathology -neg blood culture, repeat Covid neg -Continue empiric antibiotic with cefepime and vancomycin  Thyroid abnormalities/storm? -His initial presentation could be worrisome for possible thyroid storm -fever, tachycardia, low TSH and elevated free T4.  Although he did not have any real neurological symptoms, he did report some syncopal episode/dizziness and headache. -He no longer is having any fever or tachycardia.  Not dizzy.  He does report some headache -CT head negative for acute pathology, he does report history of migraine we will try Fioricet as needed Per patient he has been working up with Dr. Shaune Pascal for his thyroid problem and likely going to have a biopsy of 2 of his thyroid nodule in near future Low TSH, T4 elevated, pending free T3 -I did discuss the case with Dr. Tedd Sias at [facility] endocrinology over the phone and she was not as much concerned about thyroid storm.  Anxiety and depression Continue home medications  Dizziness -present on admission.  Now resolved Likely from dehydration from being outside in the heat all day for golfing although he does report that he drank a lot of water -Continue  aggressive IV hydration        Status is: Inpatient  Remains inpatient appropriate because:Ongoing diagnostic testing needed not appropriate for outpatient work up   Dispo: The patient is from: Home              Anticipated d/c is to: Home              Anticipated d/c date is: 1 day              Patient currently is not medically stable to d/c.       DVT prophylaxis: Lovenox Family Communication: Discussed with patient   All the records are reviewed and case discussed with Care Management/Social Worker. Management plans discussed with the patient, nursing and they are in agreement.  CODE  STATUS: Full Code  TOTAL TIME TAKING CARE OF THIS PATIENT: 35 minutes.   More than 50% of the time was spent in counseling/coordination of care: YES  POSSIBLE D/C IN 1-2 DAYS, DEPENDING ON CLINICAL CONDITION.   Delfino Lovett M.D on 03/13/2020 at 1:59 PM  [facility]   CC: Primary care physician; [name], MD  Note: This dictation was prepared with Dragon dictation along with smaller phrase technology. Any transcriptional errors that result from this process are unintentional.

## 2020-03-14 DIAGNOSIS — R509 Fever, unspecified: Secondary | ICD-10-CM | POA: Diagnosis not present

## 2020-03-14 DIAGNOSIS — R55 Syncope and collapse: Secondary | ICD-10-CM

## 2020-03-14 LAB — CBC
HCT: 35.2 % — ABNORMAL LOW (ref 39.0–52.0)
Hemoglobin: 12.1 g/dL — ABNORMAL LOW (ref 13.0–17.0)
MCH: 33.2 pg (ref 26.0–34.0)
MCHC: 34.4 g/dL (ref 30.0–36.0)
MCV: 96.7 fL (ref 80.0–100.0)
Platelets: 306 10*3/uL (ref 150–400)
RBC: 3.64 MIL/uL — ABNORMAL LOW (ref 4.22–5.81)
RDW: 12.6 % (ref 11.5–15.5)
WBC: 11.8 10*3/uL — ABNORMAL HIGH (ref 4.0–10.5)
nRBC: 0 % (ref 0.0–0.2)

## 2020-03-14 LAB — URINE CULTURE: Culture: <10000 — AB

## 2020-03-14 LAB — BASIC METABOLIC PANEL
Anion gap: 10 (ref 5–15)
BUN: 10 mg/dL (ref 6–20)
CO2: 26 mmol/L (ref 22–32)
Calcium: 8.5 mg/dL — ABNORMAL LOW (ref 8.9–10.3)
Chloride: 104 mmol/L (ref 98–111)
Creatinine, Ser: 0.74 mg/dL (ref 0.61–1.24)
GFR calc Af Amer: >60 mL/min (ref >60)
GFR calc non Af Amer: >60 mL/min (ref >60)
Glucose, Bld: 96 mg/dL (ref 70–99)
Potassium: 3.7 mmol/L (ref 3.5–5.1)
Sodium: 140 mmol/L (ref 135–145)

## 2020-03-14 LAB — T3, FREE: T3, Free: 3.9 pg/mL (ref 2.0–4.4)

## 2020-03-14 MED ORDER — DOXYCYCLINE HYCLATE 100 MG PO TABS
100.0000 mg | ORAL_TABLET | Freq: Two times a day (BID) | ORAL | Status: DC
  Administered 2020-03-14 – 2020-03-15 (×2): 100 mg via ORAL
  Filled 2020-03-14 (×2): qty 1

## 2020-03-14 MED ORDER — DEXAMETHASONE SODIUM PHOSPHATE 10 MG/ML IJ SOLN
10.0000 mg | Freq: Once | INTRAMUSCULAR | Status: AC
  Administered 2020-03-14: 10 mg via INTRAVENOUS
  Filled 2020-03-14: qty 1

## 2020-03-14 MED ORDER — SODIUM CHLORIDE 0.9 % IV SOLN: 10.0000 mg | Freq: Once | INTRAVENOUS | Status: DC

## 2020-03-14 NOTE — Progress Notes (Addendum)
Considering no other obvious source of infection, him playing golf and going in woods at times -we will switch antibiotic to oral doxycycline to cover possible tick bite related infection although he or I have not noticed any tick bites on the body.  Check antibodies for Borrelia burgdorfi.

## 2020-03-14 NOTE — Consult Note (Signed)
Reason for Consult: headache  Requesting Physician: Dr Sherryll Burger   CC: headaches and fevers   HPI: Kurt Maldonado is an 54 y.o. male known history of GERD, Parkinson's disease, syncopal episodes, anxiety, depression, positive Covid in December 2020 followed by negative test in March 2021 is being admitted for suspected sepsis without a clear source.  Febrile up to 103 with leukocytosis, tachycardia.  Pt is complaining of neck pain.   Patient has low TSH and elevated t4.   Past Medical History:  Diagnosis Date  . Anxiety   . Arthritis    hand   . BPH (benign prostatic hyperplasia)   . Collapsed lung    left lung- 1984   . CTS (carpal tunnel syndrome)   . Depression   . Dysrhythmia    pvc's per pt on no medication   . GERD (gastroesophageal reflux disease)   . History of kidney stones   . Hx of basal cell carcinoma 2009   multiple sites  . Migraines   . Parkinson's disease (HCC)   . REM behavioral disorder   . Restless legs syndrome (RLS)   . Syncopal episodes    08/2014 - followed by Dr Sherryll Burger at [place], abnormal EEG- - note 08/20/14 in Care Everywhere )    . Thyroid disease   . Tremor   . Vasovagal episode     Past Surgical History:  Procedure Laterality Date  . APPENDECTOMY    . CHOLECYSTECTOMY N/A 08/23/2017   Procedure: LAPAROSCOPIC CHOLECYSTECTOMY WITH INTRAOPERATIVE CHOLANGIOGRAM;  Surgeon: Nadeen Landau, MD;  Location: [facility] ORS;  Service: General;  Laterality: N/A;  . COLONOSCOPY  05/27/2014   adenomatous polyps and FH colon polyp-repeat 5 years per Dr Shelle Iron  . ESOPHAGOGASTRODUODENOSCOPY  05/27/2014   Esophagitis-no repeat per Dr Shelle Iron   . ESOPHAGOGASTRODUODENOSCOPY N/A 06/28/2017   Procedure: ESOPHAGOGASTRODUODENOSCOPY (EGD);  Surgeon: Toledo, Boykin Nearing, MD;  Location: [facility] END[facility];  Service: Gastroenterology;  Laterality: N/A;  . MOHS SURGERY  2012   nose, BCC  . PROSTATE BIOPSY N/A 09/08/2014   Procedure: BIOPSY TRANSRECTAL ULTRASONIC PROSTATE (TUBP);   Surgeon: Heloise Purpura, MD;  Location: [facility];  Service: Urology;  Laterality: N/A;  . SHOULDER SURGERY     bilateral     Family History  Problem Relation Age of Onset  . Dementia Mother   . Cataracts Mother   . Osteoporosis Mother   . Arthritis Mother   . Alzheimer's disease Mother   . High blood pressure Mother   . Migraines Mother   . Hypertension Father   . Kidney cancer Father   . Diabetes Mellitus II Father   . Prostate cancer Father   . Colon polyps Father   . Graves' disease Other   . Parkinson's disease Paternal Grandmother   . Tremor Paternal Grandmother   . Colon cancer Neg Hx     Social History:  reports that he has never smoked. He has never used smokeless tobacco. He reports current alcohol use. He reports that he does not use drugs.  Allergies  Allergen Reactions  . Codeine Nausea And Vomiting and Other (See Comments)    Muscle tightness     Medications: I have reviewed the patient's current medications.  ROS: History obtained from the patient  General ROS: negative for - chills, fatigue, fever, night sweats, weight gain or weight loss Psychological ROS: negative for - behavioral disorder, hallucinations, memory difficulties, mood swings or suicidal ideation Ophthalmic ROS: negative for - blurry vision, double vision, eye  pain or loss of vision ENT ROS: negative for - epistaxis, nasal discharge, oral lesions, sore throat, tinnitus or vertigo Allergy and Immunology ROS: negative for - hives or itchy/watery eyes Hematological and Lymphatic ROS: negative for - bleeding problems, bruising or swollen lymph nodes Endocrine ROS: negative for - galactorrhea, hair pattern changes, polydipsia/polyuria or temperature intolerance Respiratory ROS: negative for - cough, hemoptysis, shortness of breath or wheezing Cardiovascular ROS: negative for - chest pain, dyspnea on exertion, edema or irregular heartbeat Gastrointestinal ROS: negative for - abdominal pain,  diarrhea, hematemesis, nausea/vomiting or stool incontinence Genito-Urinary ROS: negative for - dysuria, hematuria, incontinence or urinary frequency/urgency Musculoskeletal ROS: negative for - joint swelling or muscular weakness Neurological ROS: as noted in HPI Dermatological ROS: negative for rash and skin lesion changes  Physical Examination: Blood pressure 130/75, pulse 78, temperature 99 F (37.2 C), temperature source Oral, resp. rate (!) 22, height 6\' 2"  (1.88 m), weight 91.2 kg, SpO2 100 %.    Neurological Examination   Mental Status: Alert, oriented, thought content appropriate.  Speech fluent without evidence of aphasia.  Able to follow 3 step commands without difficulty. Cranial Nerves: II: Discs flat bilaterally; Visual fields grossly normal, pupils equal, round, reactive to light and accommodation III,IV, VI: ptosis not present, extra-ocular motions intact bilaterally V,VII: smile symmetric, facial light touch sensation normal bilaterally VIII: hearing normal bilaterally IX,X: gag reflex present XI: bilateral shoulder shrug XII: midline tongue extension Motor: Right : Upper extremity   5/5    Left:     Upper extremity   5/5  Lower extremity   5/5     Lower extremity   5/5 Tone and bulk:normal tone throughout; no atrophy noted Sensory: Pinprick and light touch intact throughout, bilaterally Deep Tendon Reflexes: 2+ and symmetric throughout Plantars: Right: downgoing   Left: downgoing Cerebellar: normal finger-to-nose, normal rapid alternating movements and normal heel-to-shin test Gait: normal gait and station      Laboratory Studies:   Basic Metabolic Panel: Recent Labs  Lab 03/12/20 1047 03/13/20 0531 03/14/20 0522  NA 134* 137 140  K 4.2 3.7 3.7  CL 99 109 104  CO2 24 21* 26  GLUCOSE 105* 93 96  BUN 19 14 10   CREATININE 0.94 0.73 0.74  CALCIUM 8.9 7.8* 8.5*    Liver Function Tests: Recent Labs  Lab 03/12/20 1047  AST 65*  ALT 70*  ALKPHOS  139*  BILITOT 0.7  PROT 7.9  ALBUMIN 4.0   No results for input(s): LIPASE, AMYLASE in the last 168 hours. No results for input(s): AMMONIA in the last 168 hours.  CBC: Recent Labs  Lab 03/12/20 1047 03/13/20 0531 03/14/20 0522  WBC 16.7* 9.8 11.8*  NEUTROABS 14.6*  --   --   HGB 14.8 11.3* 12.1*  HCT 41.4 32.8* 35.2*  MCV 91.6 97.0 96.7  PLT 358 271 306    Cardiac Enzymes: No results for input(s): CKTOTAL, CKMB, CKMBINDEX, TROPONINI in the last 168 hours.  BNP: Invalid input(s): POCBNP  CBG: Recent Labs  Lab 03/12/20 1058  GLUCAP 92    Microbiology: Results for orders placed or performed during the hospital encounter of 03/12/20  Blood culture (routine single)     Status: None (Preliminary result)   Collection Time: 03/12/20 10:48 AM   Specimen: BLOOD RIGHT FOREARM  Result Value Ref Range Status   Specimen Description BLOOD RIGHT FOREARM  Final   Special Requests   Final    BOTTLES DRAWN AEROBIC AND ANAEROBIC Blood Culture adequate  volume   Culture   Final    NO GROWTH 2 DAYS Performed at [facility], [address] [place], [place] [address]    Report Status PENDING  Incomplete  Urine culture     Status: Abnormal   Collection Time: 03/12/20  1:17 PM   Specimen: Urine, Random  Result Value Ref Range Status   Specimen Description   Final    URINE, RANDOM Performed at [facility], [address], [place], [place] [address]    Special Requests   Final    NONE Performed at [facility], [address] [place], [place] [address]    Culture (A)  Final    <10,000 COLONIES/mL INSIGNIFICANT GROWTH Performed at [facility], [address], [place], [place] [address]    Report Status 03/14/2020 FINAL  Final  SARS Coronavirus 2 by RT PCR (hospital order, performed in [facility] hospital lab) Nasopharyngeal Nasopharyngeal Swab     Status: None   Collection Time: 03/12/20  5:51 PM   Specimen: Nasopharyngeal Swab  Result  Value Ref Range Status   SARS Coronavirus 2 NEGATIVE NEGATIVE Final    Comment: (NOTE) SARS-CoV-2 target nucleic acids are NOT DETECTED.  The SARS-CoV-2 RNA is generally detectable in upper and lower respiratory specimens during the acute phase of infection. The lowest concentration of SARS-CoV-2 viral copies this assay can detect is 250 copies / mL. A negative result does not preclude SARS-CoV-2 infection and should not be used as the sole basis for treatment or other patient management decisions.  A negative result may occur with improper specimen collection / handling, submission of specimen other than nasopharyngeal swab, presence of viral mutation(s) within the areas targeted by this assay, and inadequate number of viral copies (<250 copies / mL). A negative result must be combined with clinical observations, patient history, and epidemiological information.  Fact Sheet for Patients:   BoilerBrush.com.cy  Fact Sheet for Healthcare Providers: https://pope.com/  This test is not yet approved or  cleared by the [place] FDA and has been authorized for detection and/or diagnosis of SARS-CoV-2 by FDA under an Emergency Use Authorization (EUA).  This EUA will remain in effect (meaning this test can be used) for the duration of the COVID-19 declaration under Section 564(b)(1) of the Act, 21 U.S.C. section 360bbb-3(b)(1), unless the authorization is terminated or revoked sooner.  Performed at [facility], [address] [place], [place] [address]   CULTURE, BLOOD (ROUTINE X 2) w Reflex to ID Panel     Status: None (Preliminary result)   Collection Time: 03/12/20  6:05 PM   Specimen: BLOOD  Result Value Ref Range Status   Specimen Description BLOOD BLOOD RIGHT FOREARM  Final   Special Requests   Final    BOTTLES DRAWN AEROBIC AND ANAEROBIC Blood Culture results may not be optimal due to an inadequate volume of blood  received in culture bottles   Culture   Final    NO GROWTH 2 DAYS Performed at [facility], [address], [place], [place] [address]    Report Status PENDING  Incomplete  CULTURE, BLOOD (ROUTINE X 2) w Reflex to ID Panel     Status: None (Preliminary result)   Collection Time: 03/12/20  6:41 PM   Specimen: BLOOD  Result Value Ref Range Status   Specimen Description BLOOD BLOOD LEFT WRIST  Final   Special Requests   Final    BOTTLES DRAWN AEROBIC AND ANAEROBIC Blood  Culture adequate volume   Culture   Final    NO GROWTH 2 DAYS Performed at [facility], [address] [place], [place] [address]    Report Status PENDING  Incomplete  Respiratory Panel by RT PCR (Flu A&B, Covid) -     Status: None   Collection Time: 03/12/20  9:49 PM  Result Value Ref Range Status   SARS Coronavirus 2 by RT PCR NEGATIVE NEGATIVE Final    Comment: (NOTE) SARS-CoV-2 target nucleic acids are NOT DETECTED.  The SARS-CoV-2 RNA is generally detectable in upper respiratoy specimens during the acute phase of infection. The lowest concentration of SARS-CoV-2 viral copies this assay can detect is 131 copies/mL. A negative result does not preclude SARS-Cov-2 infection and should not be used as the sole basis for treatment or other patient management decisions. A negative result may occur with  improper specimen collection/handling, submission of specimen other than nasopharyngeal swab, presence of viral mutation(s) within the areas targeted by this assay, and inadequate number of viral copies (<131 copies/mL). A negative result must be combined with clinical observations, patient history, and epidemiological information. The expected result is Negative.  Fact Sheet for Patients:  PinkCheek.be  Fact Sheet for Healthcare Providers:  GravelBags.it  This test is no t yet approved or cleared by the [place] FDA and  has  been authorized for detection and/or diagnosis of SARS-CoV-2 by FDA under an Emergency Use Authorization (EUA). This EUA will remain  in effect (meaning this test can be used) for the duration of the COVID-19 declaration under Section 564(b)(1) of the Act, 21 U.S.C. section 360bbb-3(b)(1), unless the authorization is terminated or revoked sooner.     Influenza A by PCR NEGATIVE NEGATIVE Final   Influenza B by PCR NEGATIVE NEGATIVE Final    Comment: (NOTE) The Xpert Xpress SARS-CoV-2/FLU/RSV assay is intended as an aid in  the diagnosis of influenza from Nasopharyngeal swab specimens and  should not be used as a sole basis for treatment. Nasal washings and  aspirates are unacceptable for Xpert Xpress SARS-CoV-2/FLU/RSV  testing.  Fact Sheet for Patients: PinkCheek.be  Fact Sheet for Healthcare Providers: GravelBags.it  This test is not yet approved or cleared by the [place] FDA and  has been authorized for detection and/or diagnosis of SARS-CoV-2 by  FDA under an Emergency Use Authorization (EUA). This EUA will remain  in effect (meaning this test can be used) for the duration of the  Covid-19 declaration under Section 564(b)(1) of the Act, 21  U.S.C. section 360bbb-3(b)(1), unless the authorization is  terminated or revoked. Performed at [facility], [address] [place], [place] [address]     Coagulation Studies: Recent Labs    03/12/20 1047 03/13/20 0531  LABPROT 12.8 14.1  INR 1.0 1.1    Urinalysis:  Recent Labs  Lab 03/12/20 1317  COLORURINE STRAW*  LABSPEC 1.006  PHURINE 7.0  GLUCOSEU NEGATIVE  HGBUR NEGATIVE  BILIRUBINUR NEGATIVE  KETONESUR NEGATIVE  PROTEINUR NEGATIVE  NITRITE NEGATIVE  LEUKOCYTESUR NEGATIVE    Lipid Panel:     Component Value Date/Time   CHOL 182 06/29/2018 0821   TRIG 85 06/29/2018 0821   HDL 38 (L) 06/29/2018 0821   CHOLHDL 4.8 06/29/2018 0821    LDLCALC 127 (H) 06/29/2018 0821    HgbA1C:  Lab Results  Component Value Date   HGBA1C 5.4 05/03/2018    Urine Drug Screen:  No results found for: LABOPIA, [place], LABBENZ, AMPHETMU, THCU, LABBARB  Alcohol  Level: No results for input(s): ETH in the last 168 hours.  Other results: EKG: normal EKG, normal sinus rhythm, unchanged from previous tracings.  Imaging: DG Chest 2 View  Result Date: 03/12/2020 CLINICAL DATA:  Fever, tachycardia, shortness of breath EXAM: CHEST - 2 VIEW COMPARISON:  09/04/2017 FINDINGS: The heart size and mediastinal contours are within normal limits. Both lungs are clear. The visualized skeletal structures are unremarkable. IMPRESSION: No acute abnormality of the lungs. Electronically Signed   By: Lauralyn Primes M.D.   On: 03/12/2020 12:09   CT HEAD WO CONTRAST  Result Date: 03/13/2020 CLINICAL DATA:  54 year old male with history of headache and dizziness. Fever and tachycardia. EXAM: CT HEAD WITHOUT CONTRAST TECHNIQUE: Contiguous axial images were obtained from the base of the skull through the vertex without intravenous contrast. COMPARISON:  No priors. FINDINGS: Brain: Patchy areas of decreased attenuation are noted throughout the periventricular white matter of the cerebral hemispheres bilaterally, compatible with chronic microvascular ischemic disease. No evidence of acute infarction, hemorrhage, hydrocephalus, extra-axial collection or mass lesion/mass effect. Vascular: No hyperdense vessel or unexpected calcification. Skull: Normal. Negative for fracture or focal lesion. Sinuses/Orbits: No acute finding. Other: None. IMPRESSION: 1. No acute intracranial abnormalities. 2. Mild chronic microvascular ischemic changes in the periventricular white matter of the cerebral hemispheres bilaterally. Electronically Signed   By: Trudie Reed M.D.   On: 03/13/2020 12:20   CT Renal Stone Study  Result Date: 03/12/2020 CLINICAL DATA:  Difficulty urinating, flank  pain EXAM: CT ABDOMEN AND PELVIS WITHOUT CONTRAST TECHNIQUE: Multidetector CT imaging of the abdomen and pelvis was performed following the standard protocol without IV contrast. COMPARISON:  11/05/2004 FINDINGS: Lower chest: No acute pleural or parenchymal lung disease. Hepatobiliary: No focal liver abnormality is seen. Status post cholecystectomy. No biliary dilatation. Pancreas: Unremarkable. No pancreatic ductal dilatation or surrounding inflammatory changes. Spleen: Normal in size without focal abnormality. Adrenals/Urinary Tract: 1.5 cm cyst lower pole right kidney. No urinary tract calculi or obstructive uropathy. Bladder is moderately distended without focal abnormality. The adrenals are normal. Stomach/Bowel: No bowel obstruction or ileus. The appendix is surgically absent. No bowel wall thickening or inflammatory change. 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 Minimal atherosclerosis of the aorta. No pathologic adenopathy. Reproductive: Prostate is unremarkable. Other: No abdominal wall hernia or abnormality. No abdominopelvic ascites. Musculoskeletal: No acute or destructive bony lesions. Reconstructed images demonstrate no additional findings. IMPRESSION: 1. No urinary tract calculi or obstructive uropathy. 2. No acute intra-abdominal or intrapelvic process. 3. Aortic Atherosclerosis (ICD10-I70.0). Electronically Signed   By: Sharlet Salina M.D.   On: 03/12/2020 15:16     Assessment/Plan:  54 y.o. male known history of GERD, Parkinson's disease, syncopal episodes, anxiety, depression, positive Covid in December 2020 followed by negative test in March 2021 is being admitted for suspected sepsis without a clear source.  Febrile up to 103 with leukocytosis, tachycardia.  Pt is complaining of neck pain.   Patient has low TSH and elevated t4.  Pt is s/p decadron . No neck stiffness.  This is not bacterial meningitis giving  that he is alert and awake Possible concern for tick bite as pt does play golf and is in woods at times.  There is no clear signs of tick bite though.   - Unclear source of fever - CTH no acute abnormalities - on cefepime and vancomycin for suspected infection  - decadron  with improved headache - No need for LP given  intact exam - This could be viral meningitis which is supportive treatment.   03/14/2020, 10:16 AM

## 2020-03-14 NOTE — Progress Notes (Signed)
1        [place] at [facility]   PATIENT NAME: Kurt Maldonado    MR#:  [id number]  DATE OF BIRTH:  11-18-1965  SUBJECTIVE:  CHIEF COMPLAINT:   Chief Complaint  Patient presents with  . Fever  . Loss of Consciousness  Continues to have headache, reports dysuria, spiked fever up to 103 last evening REVIEW OF SYSTEMS:  Review of Systems  Constitutional: Positive for fever. Negative for diaphoresis, malaise/fatigue and weight loss.  HENT: Negative for ear discharge, ear pain, hearing loss, nosebleeds, sore throat and tinnitus.   Eyes: Negative for blurred vision and pain.  Respiratory: Negative for cough, hemoptysis, shortness of breath and wheezing.   Cardiovascular: Negative for chest pain, palpitations, orthopnea and leg swelling.  Gastrointestinal: Negative for abdominal pain, blood in stool, constipation, diarrhea, heartburn, nausea and vomiting.  Genitourinary: Positive for dysuria. Negative for frequency and urgency.  Musculoskeletal: Negative for back pain and myalgias.  Skin: Negative for itching and rash.  Neurological: Positive for headaches. Negative for dizziness, tingling, tremors, focal weakness, seizures and weakness.  Psychiatric/Behavioral: Negative for depression. The patient is not nervous/anxious.    DRUG ALLERGIES:   Allergies  Allergen Reactions  . Codeine Nausea And Vomiting and Other (See Comments)    Muscle tightness    VITALS:  Blood pressure 130/75, pulse 78, temperature 99 F (37.2 C), temperature source Oral, resp. rate (!) 22, height 6\' 2"  (1.88 m), weight 91.2 kg, SpO2 100 %. PHYSICAL EXAMINATION:  Physical Exam HENT:     Head: Normocephalic and atraumatic.  Eyes:     Conjunctiva/sclera: Conjunctivae normal.     Pupils: Pupils are equal, round, and reactive to light.  Neck:     Thyroid: No thyromegaly.     Trachea: No tracheal deviation.  Cardiovascular:     Rate and Rhythm: Normal rate and regular rhythm.     Heart sounds:  Normal heart sounds.  Pulmonary:     Effort: Pulmonary effort is normal. No respiratory distress.     Breath sounds: Normal breath sounds. No wheezing.  Chest:     Chest wall: No tenderness.  Abdominal:     Kurt: Bowel sounds are normal. There is no distension.     Palpations: Abdomen is soft.     Tenderness: There is no abdominal tenderness.  Musculoskeletal:        Kurt: Normal range of motion.     Cervical back: Normal range of motion and neck supple.  Skin:    Kurt: Skin is warm and dry.     Findings: No rash.  Neurological:     Mental Status: He is alert and oriented to person, place, and time.     Cranial Nerves: No cranial nerve deficit.    LABORATORY PANEL:  Male CBC Recent Labs  Lab 03/14/20 0522  WBC 11.8*  HGB 12.1*  HCT 35.2*  PLT 306   ------------------------------------------------------------------------------------------------------------------ Chemistries  Recent Labs  Lab 03/12/20 1047 03/13/20 0531 03/14/20 0522  NA 134*   < > 140  K 4.2   < > 3.7  CL 99   < > 104  CO2 24   < > 26  GLUCOSE 105*   < > 96  BUN 19   < > 10  CREATININE 0.94   < > 0.74  CALCIUM 8.9   < > 8.5*  AST 65*  --   --   ALT 70*  --   --   ALKPHOS 139*  --   --  BILITOT 0.7  --   --    < > = values in this interval not displayed.   RADIOLOGY:  CT HEAD WO CONTRAST  Result Date: 03/13/2020 CLINICAL DATA:  54 year old male with history of headache and dizziness. Fever and tachycardia. EXAM: CT HEAD WITHOUT CONTRAST TECHNIQUE: Contiguous axial images were obtained from the base of the skull through the vertex without intravenous contrast. COMPARISON:  No priors. FINDINGS: Brain: Patchy areas of decreased attenuation are noted throughout the periventricular white matter of the cerebral hemispheres bilaterally, compatible with chronic microvascular ischemic disease. No evidence of acute infarction, hemorrhage, hydrocephalus, extra-axial collection or mass lesion/mass  effect. Vascular: No hyperdense vessel or unexpected calcification. Skull: Normal. Negative for fracture or focal lesion. Sinuses/Orbits: No acute finding. Other: None. IMPRESSION: 1. No acute intracranial abnormalities. 2. Mild chronic microvascular ischemic changes in the periventricular white matter of the cerebral hemispheres bilaterally. Electronically Signed   By: Vinnie Langton M.D.   On: 03/13/2020 12:20   ASSESSMENT AND PLAN:   54 year old male being admitted for suspected sepsis  *Sepsis present on admission -Fever, leukocytosis, tachycardia, tachypnea.  No clear source of infection -Chest x-ray and CT renal stone study negative for any acute pathology.  Normal procalcitonin.  Negative influenza and respiratory panel -neg blood culture, repeat Covid neg -Continue empiric antibiotic with cefepime and vancomycin  Fever and headache -We will get a neurology consultation -CT head is negative -Try one-time dose of dexamethasone 10 mg IV  Thyroid abnormalities/storm? -His initial presentation could be worrisome for possible thyroid storm -fever, tachycardia, low TSH and elevated free T4.  Although he did not have any real neurological symptoms, he did report some syncopal episode/dizziness and headache. -He no longer is having any fever or tachycardia.  Not dizzy.  He does report some headache -CT head negative for acute pathology, he does report history of migraine we will try Fioricet as needed Per patient he has been working up with Dr. Dareen Piano for his thyroid problem and likely going to have a biopsy of 2 of his thyroid nodule in near future Low TSH, T4 elevated, normal free T3 -I did discuss the case with Dr. Gabriel Carina at [facility] endocrinology over the phone and she was not as much concerned about thyroid storm.  Anxiety and depression Continue home medications  Dizziness -present on admission.  Now resolved Likely from dehydration from being outside in the heat all day for golfing  although he does report that he drank a lot of water -Continue aggressive IV hydration        Status is: Inpatient  Remains inpatient appropriate because:Ongoing diagnostic testing needed not appropriate for outpatient work up   Dispo: The patient is from: Home              Anticipated d/c is to: Home              Anticipated d/c date is: 1 day              Patient currently is not medically stable to d/c.  Continues to have fever and headache.  Waiting for neuro eval    DVT prophylaxis: enoxaparin (LOVENOX) injection 40 mg Start: 03/12/20 1745 SCDs Start: 03/12/20 1733 Family Communication: Discussed with patient   All the records are reviewed and case discussed with Care Management/Social Worker. Management plans discussed with the patient, nursing and they are in agreement.  CODE STATUS: Full Code  TOTAL TIME TAKING CARE OF THIS PATIENT: 35 minutes.  More than 50% of the time was spent in counseling/coordination of care: YES  POSSIBLE D/C IN 1-2 DAYS, DEPENDING ON CLINICAL CONDITION.   Delfino Lovett M.D on 03/14/2020 at 11:11 AM  [facility]   CC: Primary care physician; [name], MD  Note: This dictation was prepared with Dragon dictation along with smaller phrase technology. Any transcriptional errors that result from this process are unintentional.

## 2020-03-15 ENCOUNTER — Other Ambulatory Visit: Payer: Self-pay | Admitting: Family Medicine

## 2020-03-15 DIAGNOSIS — E860 Dehydration: Secondary | ICD-10-CM

## 2020-03-15 DIAGNOSIS — R509 Fever, unspecified: Secondary | ICD-10-CM

## 2020-03-15 LAB — BASIC METABOLIC PANEL
Anion gap: 8 (ref 5–15)
BUN: 16 mg/dL (ref 6–20)
CO2: 23 mmol/L (ref 22–32)
Calcium: 8.4 mg/dL — ABNORMAL LOW (ref 8.9–10.3)
Chloride: 105 mmol/L (ref 98–111)
Creatinine, Ser: 0.72 mg/dL (ref 0.61–1.24)
GFR calc Af Amer: >60 mL/min (ref >60)
GFR calc non Af Amer: >60 mL/min (ref >60)
Glucose, Bld: 115 mg/dL — ABNORMAL HIGH (ref 70–99)
Potassium: 3.6 mmol/L (ref 3.5–5.1)
Sodium: 136 mmol/L (ref 135–145)

## 2020-03-15 LAB — CBC
HCT: 34.1 % — ABNORMAL LOW (ref 39.0–52.0)
Hemoglobin: 11.8 g/dL — ABNORMAL LOW (ref 13.0–17.0)
MCH: 32.7 pg (ref 26.0–34.0)
MCHC: 34.6 g/dL (ref 30.0–36.0)
MCV: 94.5 fL (ref 80.0–100.0)
Platelets: 344 10*3/uL (ref 150–400)
RBC: 3.61 MIL/uL — ABNORMAL LOW (ref 4.22–5.81)
RDW: 12.2 % (ref 11.5–15.5)
WBC: 15 10*3/uL — ABNORMAL HIGH (ref 4.0–10.5)
nRBC: 0 % (ref 0.0–0.2)

## 2020-03-15 MED ORDER — DOXYCYCLINE HYCLATE 100 MG PO TABS: 100.0000 mg | ORAL_TABLET | Freq: Two times a day (BID) | ORAL | 0 refills | Status: AC

## 2020-03-15 MED ORDER — SALINE SPRAY 0.65 % NA SOLN
1.0000 | NASAL | Status: DC | PRN
  Filled 2020-03-15: qty 44

## 2020-03-15 NOTE — Progress Notes (Addendum)
Discussed discharge instructions with patient, including medications and follow appointments  Discharge paperwork sent home.

## 2020-03-16 ENCOUNTER — Telehealth: Payer: Self-pay

## 2020-03-16 LAB — B. BURGDORFI ANTIBODIES: B burgdorferi Ab IgG+IgM: <0.91 {ISR} (ref 0.00–0.90)

## 2020-03-16 NOTE — Telephone Encounter (Signed)
Patient was recently discharged from the hospital on 03/15/20.  No TCM completed, patient does not qualify for TCM services due to [employer] coverage.  Per discharge summary patient needs follow up with PCP. No HFU scheduled at this time. FYI!

## 2020-03-16 NOTE — Discharge Summary (Signed)
[place] at [place] NAME: Kurt Maldonado    MR#:  [id number]  DATE OF BIRTH:  1966-02-22  DATE OF ADMISSION:  03/12/2020   ADMITTING PHYSICIAN: Max Sane, MD  DATE OF DISCHARGE: 03/15/2020 11:57 AM  PRIMARY CARE PHYSICIAN: Jerrol Banana., MD   ADMISSION DIAGNOSIS:  Syncope and collapse [R55] Dehydration [E86.0] Sepsis ([place]) [A41.9] Fever, unspecified fever cause [R50.9] DISCHARGE DIAGNOSIS:  Active Problems:   Syncope and collapse   Sepsis ([place])   Fever   Dehydration  SECONDARY DIAGNOSIS:   Past Medical History:  Diagnosis Date  . Anxiety   . Arthritis    hand   . BPH (benign prostatic hyperplasia)   . Collapsed lung    left lung- 1984   . CTS (carpal tunnel syndrome)   . Depression   . Dysrhythmia    pvc's per pt on no medication   . GERD (gastroesophageal reflux disease)   . History of kidney stones   . Hx of basal cell carcinoma 2009   multiple sites  . Migraines   . Parkinson's disease ([place])   . REM behavioral disorder   . Restless legs syndrome (RLS)   . Syncopal episodes    08/2014 - followed by Dr Manuella Ghazi at [place], abnormal EEG- - note 08/20/14 in [place] )    . Thyroid disease   . Tremor   . Vasovagal episode    HOSPITAL COURSE:  54 year old male being admitted for suspected sepsis  *Sepsis present on admission -Fever, leukocytosis, tachycardia, tachypnea.  Likely from tick bite -Chest x-ray and CT renal stone study negative for any acute pathology.  Normal procalcitonin.  Negative influenza and respiratory panel -neg blood culture, repeat Covid neg -Responded well to oral doxycycline  Fever and headache -Likely from tick bite -CT head is negative  Thyroid abnormalities -His initial presentation could be worrisome for possible thyroid storm -fever, tachycardia, low TSH and elevated free T4.  Although he did not have any real neurological symptoms, he did report some syncopal  episode/dizziness and headache. -He no longer is having any fever or tachycardia.  Not dizzy.  He does report some headache -CT head negative for acute pathology, he does report history of migraine - Fioricet did not help much, one-time dose of dexamethasone IV 10 mg helped. - he has been working up with Dr. Eda Keys his thyroid problem and likely going to have a biopsy of 2 of his thyroid nodule in near future Low TSH, T4 elevated, normal free T3 -I did discuss the case with Dr. Gabriel Carina at [facility] endocrinology over the phone and she was not as much concerned about thyroid storm.  Anxiety and depression Continue home medications  Dizziness -present on admission.  Now resolved Likely from dehydration from being outside in the heat all day for golfing    DISCHARGE CONDITIONS:  Stable CONSULTS OBTAINED:  Treatment Team:  Leotis Pain, MD DRUG ALLERGIES:   Allergies  Allergen Reactions  . Codeine Nausea And Vomiting and Other (See Comments)    Muscle tightness    DISCHARGE MEDICATIONS:   Allergies as of 03/15/2020      Reactions   Codeine Nausea And Vomiting, Other (See Comments)   Muscle tightness       Medication List    STOP taking these medications   azithromycin 250 MG tablet Commonly known as: ZITHROMAX     TAKE these medications   buPROPion 300 MG  24 hr tablet Commonly known as: WELLBUTRIN XL TAKE 1 TABLET BY MOUTH EVERY DAY   doxycycline 100 MG tablet Commonly known as: VIBRA-TABS Take 1 tablet (100 mg total) by mouth every 12 (twelve) hours for 4 days.   lamoTRIgine 25 MG tablet Commonly known as: LAMICTAL Take 50 mg by mouth 2 (two) times daily.   mupirocin ointment 2 % Commonly known as: BACTROBAN SMARTSIG:1 Application Topical 2-3 Times Daily   OMEPRAZOLE PO Take 80 mg by mouth daily.   predniSONE 10 MG (21) Tbpk tablet Commonly known as: STERAPRED UNI-PAK 21 TAB 6-5-4-3-2-1 tapering dose. Notes to patient: Continue previously schedule dosage      rOPINIRole 0.5 MG tablet Commonly known as: REQUIP Take 0.5 mg by mouth at bedtime.   sertraline 100 MG tablet Commonly known as: ZOLOFT TAKE 1 TABLET (100 MG TOTAL) BY MOUTH DAILY. TAKE 2 DAILY STARTING 12/20/18. What changed:   when to take this  additional instructions   temazepam 30 MG capsule Commonly known as: RESTORIL Take 30 mg by mouth at bedtime as needed for sleep.      DISCHARGE INSTRUCTIONS:   DIET:  Regular diet DISCHARGE CONDITION:  Good ACTIVITY:  Activity as tolerated OXYGEN:  Home Oxygen: No.  Oxygen Delivery: room air DISCHARGE LOCATION:  home   If you experience worsening of your admission symptoms, develop shortness of breath, life threatening emergency, suicidal or homicidal thoughts you must seek medical attention immediately by calling 911 or calling your MD immediately  if symptoms less severe.  You Must read complete instructions/literature along with all the possible adverse reactions/side effects for all the Medicines you take and that have been prescribed to you. Take any new Medicines after you have completely understood and accpet all the possible adverse reactions/side effects.   Please note  You were cared for by a hospitalist during your hospital stay. If you have any questions about your discharge medications or the care you received while you were in the hospital after you are discharged, you can call the unit and asked to speak with the hospitalist on call if the hospitalist that took care of you is not available. Once you are discharged, your primary care physician will handle any further medical issues. Please note that NO REFILLS for any discharge medications will be authorized once you are discharged, as it is imperative that you return to your primary care physician (or establish a relationship with a primary care physician if you do not have one) for your aftercare needs so that they can reassess your need for medications and  monitor your lab values.    On the day of Discharge:  VITAL SIGNS:  Blood pressure 129/76, pulse 68, temperature 97.7 F (36.5 C), temperature source Oral, resp. rate 16, height 6\' 2"  (1.88 m), weight 90.4 kg, SpO2 98 %. PHYSICAL EXAMINATION:  GENERAL:  54 y.o.-year-old patient lying in the bed with no acute distress.  EYES: Pupils equal, round, reactive to light and accommodation. No scleral icterus. Extraocular muscles intact.  HEENT: Head atraumatic, normocephalic. Oropharynx and nasopharynx clear.  NECK:  Supple, no jugular venous distention. No thyroid enlargement, no tenderness.  LUNGS: Normal breath sounds bilaterally, no wheezing, rales,rhonchi or crepitation. No use of accessory muscles of respiration.  CARDIOVASCULAR: S1, S2 normal. No murmurs, rubs, or gallops.  ABDOMEN: Soft, non-tender, non-distended. Bowel sounds present. No organomegaly or mass.  EXTREMITIES: No pedal edema, cyanosis, or clubbing.  NEUROLOGIC: Cranial nerves II through XII are intact. Muscle strength 5/5  in all extremities. Sensation intact. Gait not checked.  PSYCHIATRIC: The patient is alert and oriented x 3.  SKIN: No obvious rash, lesion, or ulcer.  DATA REVIEW:   CBC Recent Labs  Lab 03/15/20 0542  WBC 15.0*  HGB 11.8*  HCT 34.1*  PLT 344    Chemistries  Recent Labs  Lab 03/12/20 1047 03/13/20 0531 03/15/20 0542  NA 134*   < > 136  K 4.2   < > 3.6  CL 99   < > 105  CO2 24   < > 23  GLUCOSE 105*   < > 115*  BUN 19   < > 16  CREATININE 0.94   < > 0.72  CALCIUM 8.9   < > 8.4*  AST 65*  --   --   ALT 70*  --   --   ALKPHOS 139*  --   --   BILITOT 0.7  --   --    < > = values in this interval not displayed.     Outpatient follow-up  Follow-up Information    [name], MD In 4 days.   Specialty: Family Medicine Contact information: [address] [place] [address] [phone]        [facility] EMERGENCY DEPARTMENT.     Specialty: Emergency Medicine Why: If symptoms worsen Contact information: [address] [id number] [place] [address] [phone]       Alan Mulder, MD. Schedule an appointment as soon as possible for a visit in 3 day(s).   Specialty: Endocrinology Contact information: [address] [place]a F[place] [address] [phone]                Management plans discussed with the patient, family and they are in agreement.  CODE STATUS: Prior   TOTAL TIME TAKING CARE OF THIS PATIENT: 45 minutes.    Delfino Lovett M.D on 03/16/2020 at 3:47 PM  [facility]   CC: Primary care physician; [name], MD   Note: This dictation was prepared with Dragon dictation along with smaller phrase technology. Any transcriptional errors that result from this process are unintentional.

## 2020-03-17 LAB — CULTURE, BLOOD (SINGLE)
Culture: NO GROWTH
Special Requests: ADEQUATE

## 2020-03-17 LAB — CULTURE, BLOOD (ROUTINE X 2)
Culture: NO GROWTH
Culture: NO GROWTH
Special Requests: ADEQUATE

## 2020-03-19 DIAGNOSIS — E049 Nontoxic goiter, unspecified: Secondary | ICD-10-CM | POA: Diagnosis not present

## 2020-03-19 DIAGNOSIS — R1310 Dysphagia, unspecified: Secondary | ICD-10-CM | POA: Diagnosis not present

## 2020-03-19 DIAGNOSIS — E061 Subacute thyroiditis: Secondary | ICD-10-CM | POA: Diagnosis not present

## 2020-03-19 DIAGNOSIS — E7800 Pure hypercholesterolemia, unspecified: Secondary | ICD-10-CM | POA: Diagnosis not present

## 2020-03-19 DIAGNOSIS — M7918 Myalgia, other site: Secondary | ICD-10-CM | POA: Diagnosis not present

## 2020-03-19 DIAGNOSIS — H0520 Unspecified exophthalmos: Secondary | ICD-10-CM | POA: Diagnosis not present

## 2020-03-23 DIAGNOSIS — F4322 Adjustment disorder with anxiety: Secondary | ICD-10-CM | POA: Diagnosis not present

## 2020-03-24 ENCOUNTER — Encounter: Payer: Self-pay | Admitting: Family Medicine

## 2020-03-24 ENCOUNTER — Ambulatory Visit (INDEPENDENT_AMBULATORY_CARE_PROVIDER_SITE_OTHER): Payer: BC Managed Care – PPO | Admitting: Family Medicine

## 2020-03-24 ENCOUNTER — Other Ambulatory Visit: Payer: Self-pay

## 2020-03-24 VITALS — BP 114/70 | HR 81 | Temp 97.3°F | Wt 198.4 lb

## 2020-03-24 DIAGNOSIS — R55 Syncope and collapse: Secondary | ICD-10-CM

## 2020-03-24 DIAGNOSIS — E860 Dehydration: Secondary | ICD-10-CM | POA: Diagnosis not present

## 2020-03-24 DIAGNOSIS — R509 Fever, unspecified: Secondary | ICD-10-CM

## 2020-03-24 DIAGNOSIS — A419 Sepsis, unspecified organism: Secondary | ICD-10-CM

## 2020-03-24 DIAGNOSIS — E0590 Thyrotoxicosis, unspecified without thyrotoxic crisis or storm: Secondary | ICD-10-CM

## 2020-03-24 DIAGNOSIS — F329 Major depressive disorder, single episode, unspecified: Secondary | ICD-10-CM

## 2020-03-24 MED ORDER — AZITHROMYCIN 250 MG PO TABS: ORAL_TABLET | ORAL | 0 refills | Status: DC

## 2020-03-24 NOTE — Progress Notes (Signed)
Trena Platt Cummings,acting as a scribe for Wilhemena Durie, MD.,have documented all relevant documentation on the behalf of Bradrick Kamau, MD,as directed by  Wilhemena Durie, MD while in the presence of Wilhemena Durie, MD.  Established patient visit   Patient: Kurt Maldonado   DOB: 1966/09/07   54 y.o. Male  MRN: [id number] Visit Date: 03/24/2020  Today's healthcare provider: Wilhemena Durie, MD   Chief Complaint  Patient presents with  . Hospitalization Follow-up   Subjective    HPI Follow up Hospitalization Ultimately treated for [facility] spotted fever presumptively he is continued to feel poorly.  Her hospitalization and evaluation was reviewed.  It is of note that he is now on methimazole for hyperthyroidism per Dr. Ronnald Collum. He has had paroxysms of cough.  Saw ENT 2 weeks ago for treatment.  2 days ago he had sweats and was dizzy.  He is finished his doxycycline as of today he continues to have low-grade fever and dizziness and pruritic rash on the right side of his back Patient was admitted to [facility] on 03/12/20 and discharged on 03/15/20. He was treated for syncope, sepsis, fever and dehydration. Treatment for this included see chart notes.  Telephone follow up was done on 03/16/20.  He reports excellent compliance with treatment. He reports this condition is improved.  ----------------------------------------------------------------------------------------- - Patient says that today he is feeling bad and since he has left the hospital his fever has been coming and going and says that Sunday he felt like he was going to faint.    Social History   Tobacco Use  . Smoking status: Never Smoker  . Smokeless tobacco: Never Used  Vaping Use  . Vaping Use: Never used  Substance Use Topics  . Alcohol use: Yes    Comment: rare- 2 beers per month   . Drug use: No       Medications: Outpatient Medications Prior to Visit  Medication Sig  . buPROPion  (WELLBUTRIN XL) 300 MG 24 hr tablet TAKE 1 TABLET BY MOUTH EVERY DAY  . lamoTRIgine (LAMICTAL) 25 MG tablet Take 50 mg by mouth 2 (two) times daily.  . mupirocin ointment (BACTROBAN) 2 % SMARTSIG:1 Application Topical 2-3 Times Daily  . OMEPRAZOLE PO Take 80 mg by mouth daily.  Marland Kitchen rOPINIRole (REQUIP) 0.5 MG tablet Take 0.5 mg by mouth at bedtime.  . sertraline (ZOLOFT) 100 MG tablet TAKE 1 TABLET (100 MG TOTAL) BY MOUTH DAILY. TAKE 2 DAILY STARTING 12/20/18. (Patient taking differently: Take 100 mg by mouth in the morning and at bedtime. )  . temazepam (RESTORIL) 30 MG capsule Take 30 mg by mouth at bedtime as needed for sleep.  . predniSONE (STERAPRED UNI-PAK 21 TAB) 10 MG (21) TBPK tablet 6-5-4-3-2-1 tapering dose.   No facility-administered medications prior to visit.    Review of Systems  Constitutional: Positive for fatigue.  HENT: Positive for congestion.   Eyes: Negative.   Respiratory: Positive for cough.   Cardiovascular: Negative.   Gastrointestinal: Negative.   Endocrine:       Sweats  Genitourinary: Negative.   Musculoskeletal: Negative.   Allergic/Immunologic: Negative.   Hematological: Negative.   Psychiatric/Behavioral: The patient is nervous/anxious.        Objective    BP 114/70 (BP Location: Left Arm, Patient Position: Sitting, Cuff Size: Normal)   Pulse 81   Temp (!) 97.3 F (36.3 C) (Temporal)   Wt 198 lb 6.4 oz (90 kg)   SpO2  97%   BMI 25.47 kg/m     Physical Exam Vitals reviewed.  Constitutional:      Appearance: He is well-developed.  HENT:     Head: Normocephalic and atraumatic.     Right Ear: External ear normal.     Left Ear: External ear normal.     Nose: Nose normal.  Eyes:     General: No scleral icterus.    Conjunctiva/sclera: Conjunctivae normal.  Neck:     Thyroid: No thyromegaly.  Cardiovascular:     Rate and Rhythm: Normal rate and regular rhythm.     Heart sounds: Normal heart sounds.  Pulmonary:     Effort: Pulmonary effort  is normal.     Breath sounds: Normal breath sounds.  Abdominal:     Palpations: Abdomen is soft.  Skin:    General: Skin is warm and dry.  Neurological:     General: No focal deficit present.     Mental Status: He is alert and oriented to person, place, and time.  Psychiatric:        Mood and Affect: Mood normal.        Behavior: Behavior normal.        Thought Content: Thought content normal.        Judgment: Judgment normal.       No results found for any visits on 03/24/20.  Assessment & Plan     1. Syncope and collapse This was a hospital admission problem.  He is much better. - azithromycin (ZITHROMAX) 250 MG tablet; Take 2 Tablets By Mouth on Day 1, Then take 1 tablet daily.  Dispense: 6 tablet; Refill: 0 - CBC with Differential/Platelet - Sed Rate (ESR) - TSH + free T4 - Comprehensive metabolic panel  2. Dehydration The etiology of #1. - azithromycin (ZITHROMAX) 250 MG tablet; Take 2 Tablets By Mouth on Day 1, Then take 1 tablet daily.  Dispense: 6 tablet; Refill: 0 - CBC with Differential/Platelet - Sed Rate (ESR) - TSH + free T4 - Comprehensive metabolic panel  3. Sepsis, due to unspecified organism, unspecified whether acute organ dysfunction present [facility]) Work-up was completely negative.  Treated for [facility] spotted fever. - azithromycin (ZITHROMAX) 250 MG tablet; Take 2 Tablets By Mouth on Day 1, Then take 1 tablet daily.  Dispense: 6 tablet; Refill: 0 - CBC with Differential/Platelet - Sed Rate (ESR) - TSH + free T4 - Comprehensive metabolic panel  4. Fever, unspecified fever cause Switch to Z-Pak for different coverage for bronchitis.  Also try prednisone taper. - azithromycin (ZITHROMAX) 250 MG tablet; Take 2 Tablets By Mouth on Day 1, Then take 1 tablet daily.  Dispense: 6 tablet; Refill: 0 - CBC with Differential/Platelet - Sed Rate (ESR) - TSH + free T4 - Comprehensive metabolic panel  5. Reactive depression With recent divorce.  Patient is  now resigned his job.  Is excepted another job in [place] and is moving soon. I think this will make a big difference for him 6.Hyperthyroid Now on methimazole.  And to follow-up with Dr. Ronnald Collum  No follow-ups on file.      I, Wilhemena Durie, MD, have reviewed all documentation for this visit. The documentation on 03/27/20 for the exam, diagnosis, procedures, and orders are all accurate and complete.    Prosper Cranford Mon, MD  [facility] [phone] (phone) [phone] (fax)  [place]

## 2020-03-24 NOTE — Patient Instructions (Signed)
TAKE PREDNISONE TAPER!!!

## 2020-03-25 ENCOUNTER — Ambulatory Visit: Payer: Self-pay | Admitting: Family Medicine

## 2020-03-25 LAB — COMPREHENSIVE METABOLIC PANEL
ALT: 35 IU/L (ref 0–44)
AST: 17 IU/L (ref 0–40)
Albumin/Globulin Ratio: 1.5 (ref 1.2–2.2)
Albumin: 4.1 g/dL (ref 3.8–4.9)
Alkaline Phosphatase: 127 IU/L — ABNORMAL HIGH (ref 48–121)
BUN/Creatinine Ratio: 15 (ref 9–20)
BUN: 13 mg/dL (ref 6–24)
Bilirubin Total: 0.5 mg/dL (ref 0.0–1.2)
CO2: 22 mmol/L (ref 20–29)
Calcium: 9.3 mg/dL (ref 8.7–10.2)
Chloride: 102 mmol/L (ref 96–106)
Creatinine, Ser: 0.84 mg/dL (ref 0.76–1.27)
GFR calc Af Amer: 114 mL/min/{1.73_m2} (ref >59)
GFR calc non Af Amer: 99 mL/min/{1.73_m2} (ref >59)
Globulin, Total: 2.7 g/dL (ref 1.5–4.5)
Glucose: 103 mg/dL — ABNORMAL HIGH (ref 65–99)
Potassium: 4.6 mmol/L (ref 3.5–5.2)
Sodium: 138 mmol/L (ref 134–144)
Total Protein: 6.8 g/dL (ref 6.0–8.5)

## 2020-03-25 LAB — CBC WITH DIFFERENTIAL/PLATELET
Basophils Absolute: 0.1 10*3/uL (ref 0.0–0.2)
Basos: 1 %
EOS (ABSOLUTE): 0.3 10*3/uL (ref 0.0–0.4)
Eos: 3 %
Hematocrit: 41.4 % (ref 37.5–51.0)
Hemoglobin: 14.3 g/dL (ref 13.0–17.7)
Immature Grans (Abs): 0 10*3/uL (ref 0.0–0.1)
Immature Granulocytes: 0 %
Lymphocytes Absolute: 1.7 10*3/uL (ref 0.7–3.1)
Lymphs: 18 %
MCH: 33.3 pg — ABNORMAL HIGH (ref 26.6–33.0)
MCHC: 34.5 g/dL (ref 31.5–35.7)
MCV: 96 fL (ref 79–97)
Monocytes Absolute: 0.9 10*3/uL (ref 0.1–0.9)
Monocytes: 10 %
Neutrophils Absolute: 6.5 10*3/uL (ref 1.4–7.0)
Neutrophils: 68 %
Platelets: 463 10*3/uL — ABNORMAL HIGH (ref 150–450)
RBC: 4.3 x10E6/uL (ref 4.14–5.80)
RDW: 12 % (ref 11.6–15.4)
WBC: 9.5 10*3/uL (ref 3.4–10.8)

## 2020-03-25 LAB — TSH+FREE T4
Free T4: 1.85 ng/dL — ABNORMAL HIGH (ref 0.82–1.77)
TSH: 0.007 u[IU]/mL — ABNORMAL LOW (ref 0.450–4.500)

## 2020-03-25 LAB — SEDIMENTATION RATE: Sed Rate: 53 mm/hr — ABNORMAL HIGH (ref 0–30)

## 2020-03-26 ENCOUNTER — Telehealth: Payer: Self-pay

## 2020-03-26 NOTE — Telephone Encounter (Signed)
-----   Message from [name], MD sent at 03/25/2020  3:48 PM EDT ----- Your labs are normal but your thyroid is definitely hyper at this time.

## 2020-03-26 NOTE — Telephone Encounter (Signed)
Patient advised of lab results

## 2020-03-30 ENCOUNTER — Ambulatory Visit: Payer: BC Managed Care – PPO | Admitting: Family Medicine

## 2020-04-17 ENCOUNTER — Other Ambulatory Visit: Payer: Self-pay | Admitting: Family Medicine

## 2020-04-17 NOTE — Telephone Encounter (Signed)
Please advise 

## 2020-04-17 NOTE — Telephone Encounter (Signed)
[facility] faxed refill request for the following medications:  temazepam (RESTORIL) 30 MG capsule   Please advise.

## 2020-04-20 MED ORDER — TEMAZEPAM 30 MG PO CAPS: 30.0000 mg | ORAL_CAPSULE | Freq: Every evening | ORAL | 2 refills | Status: DC | PRN

## 2020-04-23 DIAGNOSIS — Z7189 Other specified counseling: Secondary | ICD-10-CM | POA: Diagnosis not present

## 2020-04-23 DIAGNOSIS — E049 Nontoxic goiter, unspecified: Secondary | ICD-10-CM | POA: Diagnosis not present

## 2020-04-23 DIAGNOSIS — R1310 Dysphagia, unspecified: Secondary | ICD-10-CM | POA: Diagnosis not present

## 2020-04-23 DIAGNOSIS — E061 Subacute thyroiditis: Secondary | ICD-10-CM | POA: Diagnosis not present

## 2020-04-30 DIAGNOSIS — F4322 Adjustment disorder with anxiety: Secondary | ICD-10-CM | POA: Diagnosis not present

## 2020-05-07 ENCOUNTER — Other Ambulatory Visit: Payer: Self-pay | Admitting: Family Medicine

## 2020-05-07 DIAGNOSIS — F419 Anxiety disorder, unspecified: Secondary | ICD-10-CM

## 2020-07-07 ENCOUNTER — Other Ambulatory Visit: Payer: Self-pay | Admitting: Dermatology

## 2020-07-07 ENCOUNTER — Other Ambulatory Visit: Payer: Self-pay

## 2020-07-07 MED ORDER — VALACYCLOVIR HCL 1 G PO TABS: ORAL_TABLET | ORAL | 11 refills | Status: AC

## 2020-07-07 NOTE — Telephone Encounter (Signed)
OK to send Valacyclovir as requested with 11 Rf. Please check to see if pt has a future appt. If no appt, please schedule appt for pt.

## 2020-07-07 NOTE — Telephone Encounter (Signed)
Patient would like a refill on Valacyclovir, but for HSV outbreaks and not shingles. OK to send in #[id number]? He has used it in the past for fever blisters and states that it helped.   [facility] [address]

## 2020-07-15 IMAGING — CR DG CHEST 2V
3 series · 3 of 3 positions shown · non-contrast
Comparison: 09/04/2017

CLINICAL DATA: Fever, tachycardia, shortness of breath

EXAM:
CHEST - 2 VIEW

[chest ap (1 of 2)]
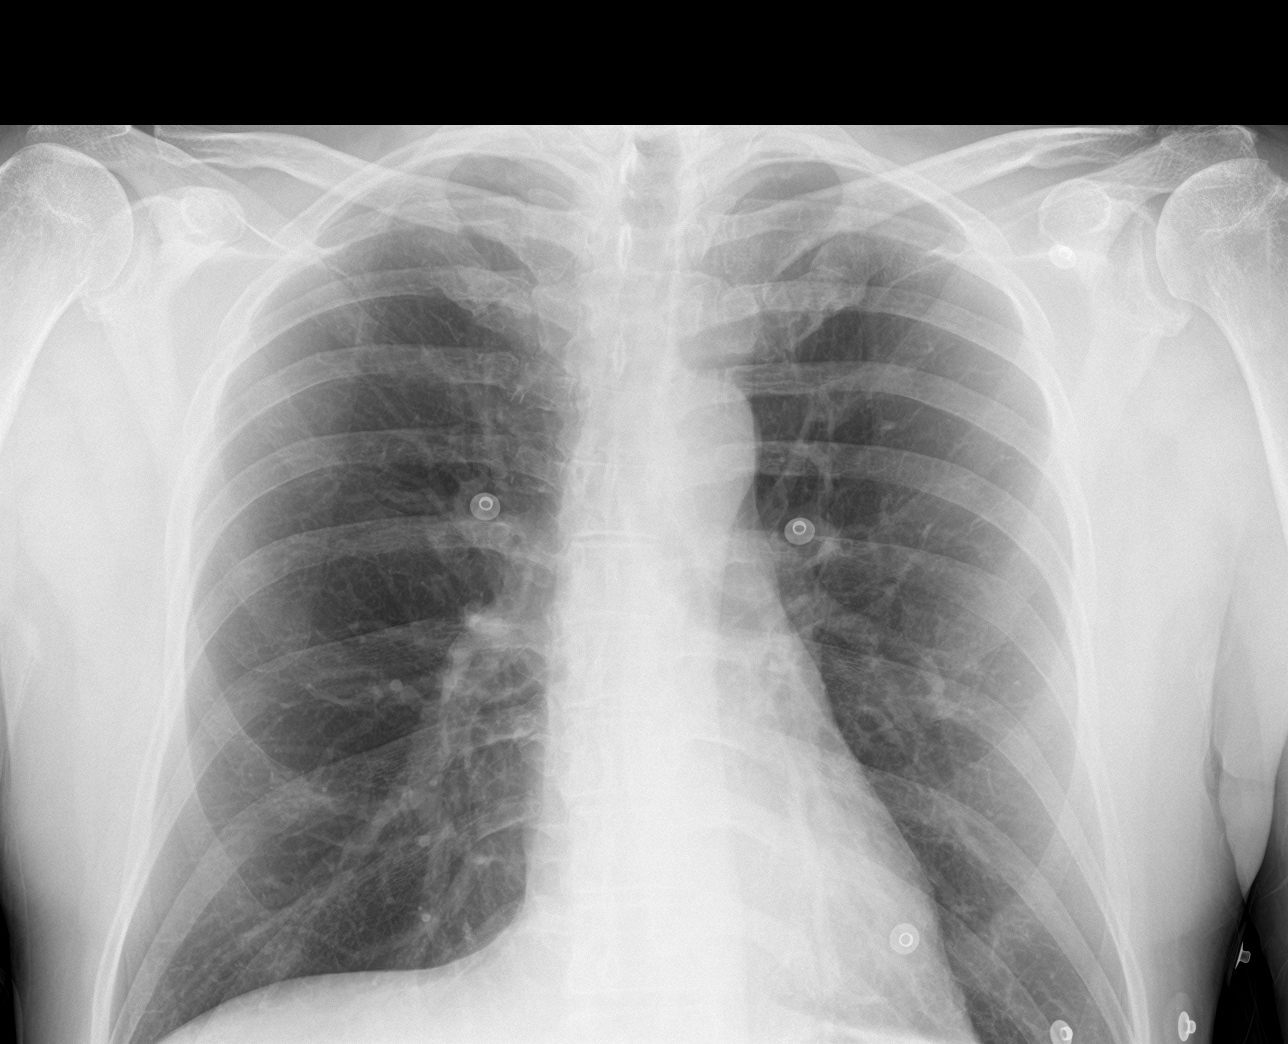

[chest lat]
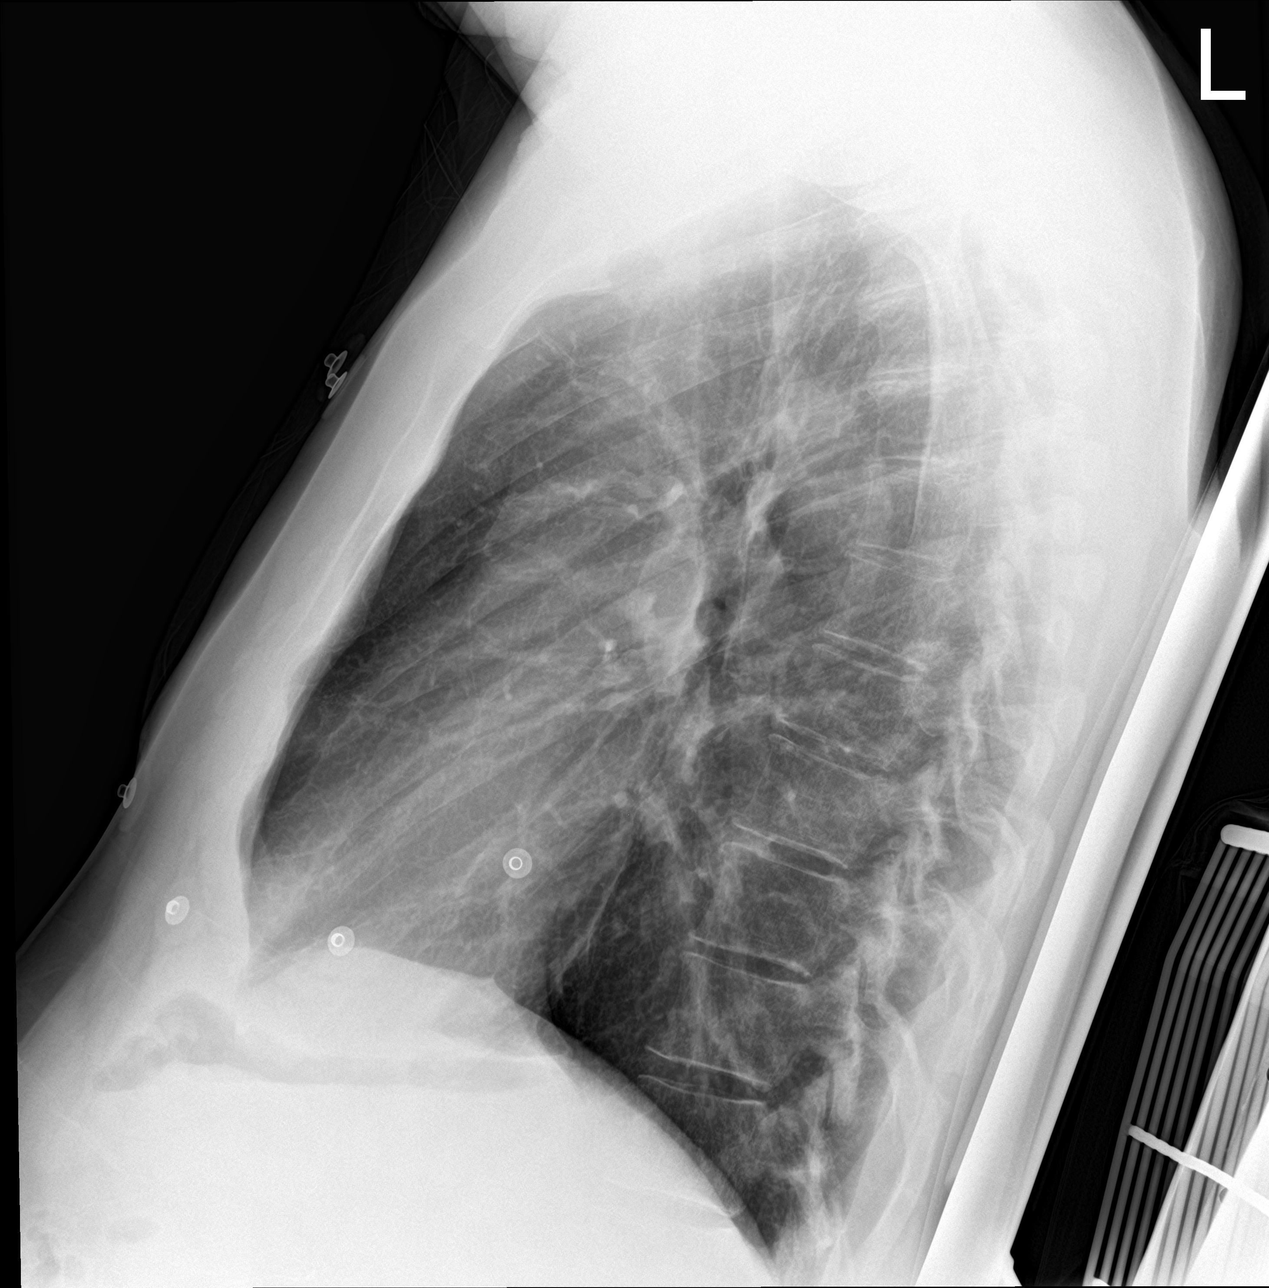

[chest ap (2 of 2)]
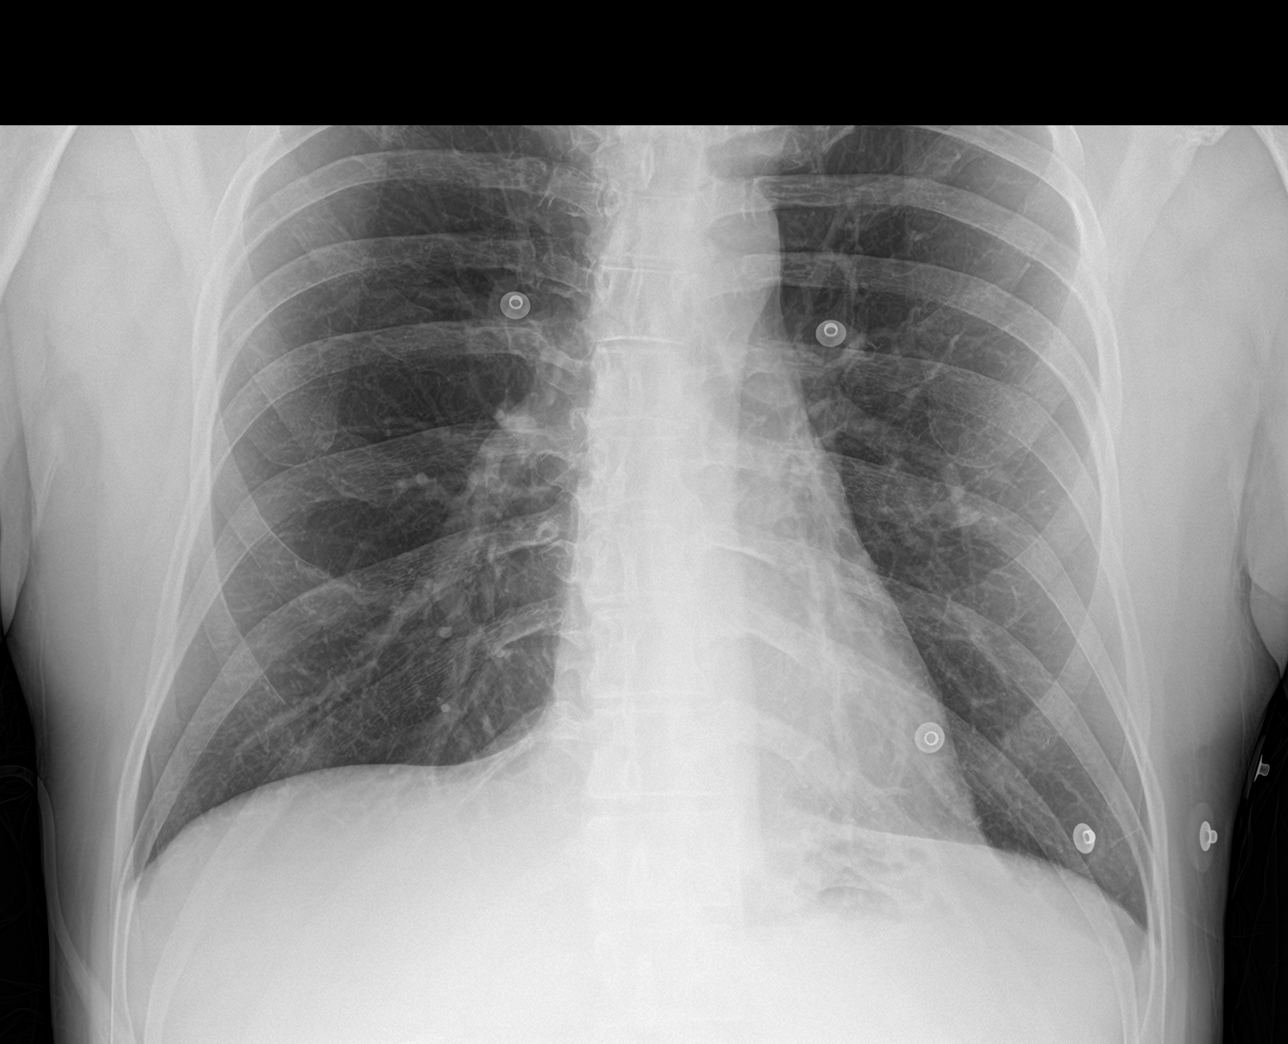

[3 of 3 positions shown; findings below may reference images not displayed]

FINDINGS: The heart size and mediastinal contours are within normal limits.
Both lungs are clear. The visualized skeletal structures are
unremarkable.
IMPRESSION: No acute abnormality of the lungs.

## 2020-08-06 ENCOUNTER — Telehealth: Payer: Self-pay | Admitting: Family Medicine

## 2020-08-06 NOTE — Telephone Encounter (Signed)
Requested medications are due for refill today yes  Requested medications are on the active medication list yes  Last refill 10/5  Last visit 03/2020 did not address dx/med, 08/2019 addressed mental health  Future visit scheduled no  Notes to clinic Not Delegated.

## 2020-08-07 ENCOUNTER — Other Ambulatory Visit: Payer: Self-pay | Admitting: *Deleted

## 2020-08-07 MED ORDER — TEMAZEPAM 30 MG PO CAPS: 30.0000 mg | ORAL_CAPSULE | Freq: Every evening | ORAL | 1 refills | Status: DC | PRN

## 2020-08-11 ENCOUNTER — Other Ambulatory Visit: Payer: Self-pay | Admitting: Family Medicine

## 2020-08-11 DIAGNOSIS — R42 Dizziness and giddiness: Secondary | ICD-10-CM

## 2020-08-11 MED ORDER — MECLIZINE HCL 25 MG PO TABS: 25.0000 mg | ORAL_TABLET | Freq: Three times a day (TID) | ORAL | 2 refills | Status: AC | PRN

## 2020-08-11 NOTE — Progress Notes (Signed)
.  ve

## 2020-08-17 ENCOUNTER — Telehealth: Payer: Self-pay | Admitting: Family Medicine

## 2020-09-01 ENCOUNTER — Encounter: Payer: BC Managed Care – PPO | Admitting: Dermatology

## 2020-09-09 ENCOUNTER — Other Ambulatory Visit: Payer: Self-pay

## 2020-09-09 NOTE — Telephone Encounter (Signed)
Patient requesting temazepam to be [facility] into [facility] in [place]. Please review. Thanks!

## 2020-09-10 MED ORDER — TEMAZEPAM 30 MG PO CAPS: 30.0000 mg | ORAL_CAPSULE | Freq: Every evening | ORAL | 1 refills | Status: DC | PRN

## 2021-01-25 ENCOUNTER — Other Ambulatory Visit: Payer: Self-pay | Admitting: Family Medicine

## 2021-01-25 DIAGNOSIS — G4700 Insomnia, unspecified: Secondary | ICD-10-CM

## 2021-01-25 MED ORDER — TEMAZEPAM 30 MG PO CAPS: 30.0000 mg | ORAL_CAPSULE | Freq: Every evening | ORAL | 1 refills | Status: DC | PRN

## 2021-01-25 NOTE — Telephone Encounter (Signed)
Please advice if okay to refill sleep aide? Patient last visit was 03/04/20, last office visit addressing insomnia has bee >66months. KW

## 2021-01-25 NOTE — Telephone Encounter (Signed)
I pended medication for you to sign off on. KW

## 2021-01-25 NOTE — Telephone Encounter (Signed)
[facility] ref[facility] request for the following medications:  temazepam (RESTORIL) 30 MG capsule  Last Rx: 09/10/20 w/1 refill LOV: 03/24/20 Please advise. Thanks TNP

## 2021-01-25 NOTE — Telephone Encounter (Signed)
30 with 1 refill

## 2021-03-18 ENCOUNTER — Ambulatory Visit (INDEPENDENT_AMBULATORY_CARE_PROVIDER_SITE_OTHER): Payer: BLUE CROSS/BLUE SHIELD | Admitting: Family Medicine

## 2021-03-18 ENCOUNTER — Other Ambulatory Visit: Payer: Self-pay

## 2021-03-18 ENCOUNTER — Encounter: Payer: Self-pay | Admitting: Family Medicine

## 2021-03-18 VITALS — BP 117/79 | HR 64 | Temp 98.3°F | Resp 16 | Ht 74.0 in | Wt 213.0 lb

## 2021-03-18 DIAGNOSIS — E0590 Thyrotoxicosis, unspecified without thyrotoxic crisis or storm: Secondary | ICD-10-CM

## 2021-03-18 DIAGNOSIS — Z0000 Encounter for general adult medical examination without abnormal findings: Secondary | ICD-10-CM

## 2021-03-18 DIAGNOSIS — Z125 Encounter for screening for malignant neoplasm of prostate: Secondary | ICD-10-CM | POA: Diagnosis not present

## 2021-03-18 DIAGNOSIS — K219 Gastro-esophageal reflux disease without esophagitis: Secondary | ICD-10-CM

## 2021-03-18 DIAGNOSIS — R42 Dizziness and giddiness: Secondary | ICD-10-CM | POA: Diagnosis not present

## 2021-03-18 DIAGNOSIS — F329 Major depressive disorder, single episode, unspecified: Secondary | ICD-10-CM

## 2021-03-18 LAB — POCT URINALYSIS DIPSTICK
Bilirubin, UA: NEGATIVE
Blood, UA: NEGATIVE
Glucose, UA: NEGATIVE
Ketones, UA: NEGATIVE
Leukocytes, UA: NEGATIVE
Nitrite, UA: NEGATIVE
Protein, UA: NEGATIVE
Spec Grav, UA: 1.02 (ref 1.010–1.025)
Urobilinogen, UA: 0.2 E.U./dL
pH, UA: 6.5 (ref 5.0–8.0)

## 2021-03-18 NOTE — Progress Notes (Signed)
Complete physical exam   Patient: Kurt Maldonado   DOB: July 22, 1966   55 y.o. Male  MRN: [id number] Visit Date: 03/18/2021  Today's healthcare provider: Megan Mansichard Joelly Bolanos Jr, MD   Chief Complaint  Patient presents with   Annual Exam   Subjective    Kurt Maldonado is a 55 y.o. male who presents today for a complete physical exam.  He reports consuming a general diet. Home exercise routine includes walking 1 hrs per day. He generally feels well. He reports sleeping well. He does not have additional problems to discuss today.  Divorced and the father of 2 daughters and is expecting a second grandchild in a couple of months.  He recently moved back from [place] to [facility] [place] and is very pleased with this.  Overall he is feeling well.  Refills on omeprazole and temazepam.  Past Medical History:  Diagnosis Date   Anxiety    Arthritis    hand    BPH (benign prostatic hyperplasia)    Collapsed lung    left lung- 1984    CTS (carpal tunnel syndrome)    Depression    Dysrhythmia    pvc's per pt on no medication    GERD (gastroesophageal reflux disease)    History of kidney stones    Hx of basal cell carcinoma 2009   multiple sites   Migraines    Parkinson's disease (HCC)    REM behavioral disorder    Restless legs syndrome (RLS)    Syncopal episodes    08/2014 - followed by Dr Sherryll BurgerShah at [facility], abnormal EEG- - note 08/20/14 in Care Everywhere )     Thyroid disease    Tremor    Vasovagal episode    Past Surgical History:  Procedure Laterality Date   APPENDECTOMY     CHOLECYSTECTOMY N/A 08/23/2017   Procedure: LAPAROSCOPIC CHOLECYSTECTOMY WITH INTRAOPERATIVE CHOLANGIOGRAM;  Surgeon: Nadeen LandauSmith, Jarvis Wilton, MD;  Location: [facility]S;  Service: General;  Laterality: N/A;   COLONOSCOPY  05/27/2014   adenomatous polyps and FH colon polyp-repeat 5 years per Dr Shelle Ironein   ESOPHAGOGASTRODUODENOSCOPY  05/27/2014   Esophagitis-no repeat per Dr Shelle Ironein     ESOPHAGOGASTRODUODENOSCOPY N/A 06/28/2017   Procedure: ESOPHAGOGASTRODUODENOSCOPY (EGD);  Surgeon: Toledo, Boykin Nearingeodoro K, MD;  Location: [facility];  Service: Gastroenterology;  Laterality: N/A;   MOHS SURGERY  2012   nose, BCC   PROSTATE BIOPSY N/A 09/08/2014   Procedure: BIOPSY TRANSRECTAL ULTRASONIC PROSTATE (TUBP);  Surgeon: Heloise PurpuraLester Borden, MD;  Location: [facility];  Service: Urology;  Laterality: N/A;   SHOULDER SURGERY     bilateral    Social History   Socioeconomic History   Marital status: Single    Spouse name: Not on file   Number of children: Not on file   Years of education: Not on file   Highest education level: Not on file  Occupational History   Not on file  Tobacco Use   Smoking status: Never   Smokeless tobacco: Never  Vaping Use   Vaping Use: Never used  Substance and Sexual Activity   Alcohol use: Yes    Comment: rare- 2 beers per month    Drug use: No   Sexual activity: Not on file  Other Topics Concern   Not on file  Social History Narrative   Not on file   Social Determinants of Health   Financial Resource Strain: Not on file  Food Insecurity: Not on file  Transportation Needs: Not on  file  Physical Activity: Not on file  Stress: Not on file  Social Connections: Not on file  Intimate Partner Violence: Not on file   Family Status  Relation Name Status   Mother  Deceased at age 49   Father  Alive   Sister  Alive   Brother  Alive   Daughter  Alive   Sister  Alive   Daughter  Alive   Other  (Not Specified)   PGM  (Not Specified)   Neg Hx  (Not Specified)   Family History  Problem Relation Age of Onset   Dementia Mother    Cataracts Mother    Osteoporosis Mother    Arthritis Mother    Alzheimer's disease Mother    High blood pressure Mother    Migraines Mother    Hypertension Father    Kidney cancer Father    Diabetes Mellitus II Father    Prostate cancer Father    Colon polyps Father    Graves' disease Other    Parkinson's disease  Paternal Grandmother    Tremor Paternal Grandmother    Colon cancer Neg Hx    Allergies  Allergen Reactions   Codeine Nausea And Vomiting and Other (See Comments)    Muscle tightness     Patient Care Team: [name], MD as PCP - General (Family Medicine)   Medications: Outpatient Medications Prior to Visit  Medication Sig   azithromycin (ZITHROMAX) 250 MG tablet Take 2 Tablets By Mouth on Day 1, Then take 1 tablet daily.   buPROPion (WELLBUTRIN XL) 300 MG 24 hr tablet TAKE 1 TABLET BY MOUTH EVERY DAY   lamoTRIgine (LAMICTAL) 25 MG tablet Take 50 mg by mouth 2 (two) times daily.   meclizine (ANTIVERT) 25 MG tablet Take 1 tablet (25 mg total) by mouth 3 (three) times daily as needed for dizziness.   mupirocin ointment (BACTROBAN) 2 % SMARTSIG:1 Application Topical 2-3 Times Daily   OMEPRAZOLE PO Take 80 mg by mouth daily.   predniSONE (STERAPRED UNI-PAK 21 TAB) 10 MG (21) TBPK tablet 6-5-4-3-2-1 tapering dose.   rOPINIRole (REQUIP) 0.5 MG tablet Take 0.5 mg by mouth at bedtime.   sertraline (ZOLOFT) 100 MG tablet TAKE 1 TABLET (100 MG TOTAL) BY MOUTH DAILY. TAKE 2 DAILY STARTING 12/20/18. (Patient taking differently: Take 100 mg by mouth in the morning and at bedtime. )   temazepam (RESTORIL) 30 MG capsule Take 1 capsule (30 mg total) by mouth at bedtime as needed for sleep.   valACYclovir (VALTREX) 1000 MG tablet Take 2 tabs po at onset of fever blister. Then take 2 more tabs po 12 hours later.   No facility-administered medications prior to visit.    Review of Systems  Constitutional: Negative.   HENT: Negative.    Eyes: Negative.   Respiratory: Negative.    Cardiovascular: Negative.   Gastrointestinal: Negative.   Endocrine: Negative.   Genitourinary: Negative.   Musculoskeletal:  Positive for arthralgias.  Skin: Negative.   Allergic/Immunologic: Negative.   Neurological: Negative.   Hematological: Negative.   Psychiatric/Behavioral: Negative.         Objective    BP 117/79   Pulse 64   Temp 98.3 F (36.8 C)   Resp 16   Ht 6\' 2"  (1.88 m)   Wt 213 lb (96.6 kg)   BMI 27.35 kg/m  BP Readings from Last 3 Encounters:  03/18/21 117/79  03/24/20 114/70  03/15/20 129/76   Wt Readings from Last 3 Encounters:  03/18/21 213 lb (96.6 kg)  03/24/20 198 lb 6.4 oz (90 kg)  03/15/20 199 lb 6.4 oz (90.4 kg)      Physical Exam Vitals reviewed.  Constitutional:      Appearance: Normal appearance. He is well-developed and normal weight.  HENT:     Head: Normocephalic and atraumatic.     Right Ear: External ear normal.     Left Ear: External ear normal.     Nose: Nose normal.  Eyes:     General: No scleral icterus.    Conjunctiva/sclera: Conjunctivae normal.  Neck:     Thyroid: No thyromegaly.  Cardiovascular:     Rate and Rhythm: Normal rate and regular rhythm.     Heart sounds: Normal heart sounds.  Pulmonary:     Effort: Pulmonary effort is normal.     Breath sounds: Normal breath sounds.  Abdominal:     Palpations: Abdomen is soft.  Genitourinary:    Penis: Normal.      Testes: Normal.  Lymphadenopathy:     Cervical: No cervical adenopathy.  Skin:    General: Skin is warm and dry.  Neurological:     General: No focal deficit present.     Mental Status: He is alert and oriented to person, place, and time. Mental status is at baseline.  Psychiatric:        Mood and Affect: Mood normal.        Behavior: Behavior normal.        Thought Content: Thought content normal.        Judgment: Judgment normal.      Last depression screening scores PHQ 2/9 Scores 03/18/2021 08/20/2019 12/03/2018  PHQ - 2 Score 1 5 4   PHQ- 9 Score 4 15 -   Last fall risk screening Fall Risk  03/18/2021  Falls in the past year? 0  Number falls in past yr: 0  Injury with Fall? 0  Risk for fall due to : No Fall Risks  Follow up Falls evaluation completed   Last Audit-C alcohol use screening Alcohol Use Disorder Test (AUDIT) 03/18/2021  1. How  often do you have a drink containing alcohol? 3  2. How many drinks containing alcohol do you have on a typical day when you are drinking? 0  3. How often do you have six or more drinks on one occasion? 1  AUDIT-C Score 4  4. How often during the last year have you found that you were not able to stop drinking once you had started? 0  5. How often during the last year have you failed to do what was normally expected from you because of drinking? 0  6. How often during the last year have you needed a first drink in the morning to get yourself going after a heavy drinking session? 0  7. How often during the last year have you had a feeling of guilt of remorse after drinking? 0  8. How often during the last year have you been unable to remember what happened the night before because you had been drinking? 0  9. Have you or someone else been injured as a result of your drinking? 0  10. Has a relative or friend or a doctor or another health worker been concerned about your drinking or suggested you cut down? 0  Alcohol Use Disorder Identification Test Final Score (AUDIT) 4   A score of 3 or more in women, and 4 or more in men indicates increased risk  for alcohol abuse, EXCEPT if all of the points are from question 1   No results found for any visits on 03/18/21.  Assessment & Plan    Routine Health Maintenance and Physical Exam  Exercise Activities and Dietary recommendations  Goals   None     Immunization History  Administered Date(s) Administered   Influenza,inj,Quad PF,6+ Mos 09/14/2016   Influenza,inj,quad, With Preservative 10/20/2017   Influenza-Unspecified 07/16/2018    Health Maintenance  Topic Date Due   TETANUS/TDAP  Never done   Zoster Vaccines- Shingrix (1 of 2) Never done   COVID-19 Vaccine (3 - Booster for Moderna series) 11/09/2020   INFLUENZA VACCINE  05/03/2021   COLONOSCOPY (Pts 45-37yrs Insurance coverage will need to be confirmed)  05/27/2024   Hepatitis C  Screening  Completed   HIV Screening  Completed   Pneumococcal Vaccine 56-32 Years old  Aged Out   HPV VACCINES  Aged Out    Discussed health benefits of physical activity, and encouraged him to engage in regular exercise appropriate for his age and condition.  .1. Annual physical exam Up-to-date on health maintenance - POCT urinalysis dipstick - CBC with Differential/Platelet - Comprehensive metabolic panel - Lipid panel  2. Vertigo Improved  3. Hyperthyroidism Follow yearly TSH - TSH  4. Prostate cancer screening  - PSA  5. Reactive depression Clinically improved.  Continue temazepam as needed for sleep.  Try to cut back on this.  6. Gastroesophageal reflux disease without esophagitis Use omeprazole.   No follow-ups on file.     I, Megan Mans, MD, have reviewed all documentation for this visit. The documentation on 03/25/21 for the exam, diagnosis, procedures, and orders are all accurate and complete.    Herald Wendelyn Breslow, MD  [facility] [phone] (phone) [phone] (fax)  [facility]

## 2021-03-19 LAB — CBC WITH DIFFERENTIAL/PLATELET
Basophils Absolute: 0.1 10*3/uL (ref 0.0–0.2)
Basos: 2 %
EOS (ABSOLUTE): 0.4 10*3/uL (ref 0.0–0.4)
Eos: 7 %
Hematocrit: 44.1 % (ref 37.5–51.0)
Hemoglobin: 15 g/dL (ref 13.0–17.7)
Immature Grans (Abs): 0 10*3/uL (ref 0.0–0.1)
Immature Granulocytes: 0 %
Lymphocytes Absolute: 1.8 10*3/uL (ref 0.7–3.1)
Lymphs: 29 %
MCH: 31.9 pg (ref 26.6–33.0)
MCHC: 34 g/dL (ref 31.5–35.7)
MCV: 94 fL (ref 79–97)
Monocytes Absolute: 0.7 10*3/uL (ref 0.1–0.9)
Monocytes: 11 %
Neutrophils Absolute: 3.1 10*3/uL (ref 1.4–7.0)
Neutrophils: 51 %
Platelets: 323 10*3/uL (ref 150–450)
RBC: 4.7 x10E6/uL (ref 4.14–5.80)
RDW: 13 % (ref 11.6–15.4)
WBC: 6.2 10*3/uL (ref 3.4–10.8)

## 2021-03-19 LAB — COMPREHENSIVE METABOLIC PANEL
ALT: 26 IU/L (ref 0–44)
AST: 28 IU/L (ref 0–40)
Albumin/Globulin Ratio: 1.8 (ref 1.2–2.2)
Albumin: 4.7 g/dL (ref 3.8–4.9)
Alkaline Phosphatase: 104 IU/L (ref 44–121)
BUN/Creatinine Ratio: 15 (ref 9–20)
BUN: 16 mg/dL (ref 6–24)
Bilirubin Total: 0.6 mg/dL (ref 0.0–1.2)
CO2: 21 mmol/L (ref 20–29)
Calcium: 9.4 mg/dL (ref 8.7–10.2)
Chloride: 102 mmol/L (ref 96–106)
Creatinine, Ser: 1.05 mg/dL (ref 0.76–1.27)
Globulin, Total: 2.6 g/dL (ref 1.5–4.5)
Glucose: 98 mg/dL (ref 65–99)
Potassium: 4.5 mmol/L (ref 3.5–5.2)
Sodium: 142 mmol/L (ref 134–144)
Total Protein: 7.3 g/dL (ref 6.0–8.5)
eGFR: 83 mL/min/{1.73_m2} (ref >59)

## 2021-03-19 LAB — PSA: Prostate Specific Ag, Serum: 1.5 ng/mL (ref 0.0–4.0)

## 2021-03-19 LAB — LIPID PANEL
Chol/HDL Ratio: 4.4 ratio (ref 0.0–5.0)
Cholesterol, Total: 191 mg/dL (ref 100–199)
HDL: 43 mg/dL (ref >39)
LDL Chol Calc (NIH): 134 mg/dL — ABNORMAL HIGH (ref 0–99)
Triglycerides: 76 mg/dL (ref 0–149)
VLDL Cholesterol Cal: 14 mg/dL (ref 5–40)

## 2021-03-19 LAB — TSH: TSH: 1.49 u[IU]/mL (ref 0.450–4.500)

## 2021-04-13 ENCOUNTER — Other Ambulatory Visit: Payer: Self-pay | Admitting: Family Medicine

## 2021-04-13 DIAGNOSIS — G4700 Insomnia, unspecified: Secondary | ICD-10-CM

## 2021-04-13 NOTE — Telephone Encounter (Signed)
Requested medication (s) are due for refill today: yes  Requested medication (s) are on the active medication list: yes  Last refill:  01/25/21 #30 1 refill  Future visit scheduled: yes  Notes to clinic:  not delegated per protocol     Requested Prescriptions  Pending Prescriptions Disp Refills   temazepam (RESTORIL) 30 MG capsule [Pharmacy Med Name: TEMAZEPAM 30 MG CAPSULE] 30 capsule 1    Sig: TAKE 1 CAPSULE BY MOUTH AT BEDTIME AS NEEDED FOR SLEEP.      Not Delegated - Psychiatry:  Anxiolytics/Hypnotics Failed - 04/13/2021  4:49 PM      Failed - This refill cannot be delegated      Failed - Urine Drug Screen completed in last 360 days      Passed - Valid encounter within last 6 months    Recent Outpatient Visits           3 weeks ago Annual physical exam   [facility] [name], MD   1 year ago Syncope and collapse   [facility] [name], MD   1 year ago Reactive depression   [facility] [name], MD   2 years ago Gastroesophageal reflux disease, esophagitis presence not specified   [facility] [name], MD   2 years ago Annual physical exam   [facility] [name], MD       Future Appointments             In 5 months [name], MD [facility]

## 2021-09-20 ENCOUNTER — Ambulatory Visit: Payer: Self-pay | Admitting: Family Medicine

## 2021-10-22 ENCOUNTER — Other Ambulatory Visit: Payer: Self-pay | Admitting: Family Medicine

## 2021-10-22 DIAGNOSIS — G4700 Insomnia, unspecified: Secondary | ICD-10-CM

## 2021-10-22 NOTE — Telephone Encounter (Signed)
Requested medication (s) are due for refill today: yes  Requested medication (s) are on the active medication list: yes  Last refill:  04/15/2021 for #30 with 5 refills  Future visit scheduled: no, had visit but it was canceled by provider.  Notes to clinic:  This medication can not be delegated, please assess.   Requested Prescriptions  Pending Prescriptions Disp Refills   temazepam (RESTORIL) 30 MG capsule [Pharmacy Med Name: TEMAZEPAM 30 MG CAPSULE] 30 capsule     Sig: TAKE 1 CAPSULE BY MOUTH AT BEDTIME AS NEEDED FOR SLEEP.     Not Delegated - Psychiatry:  Anxiolytics/Hypnotics Failed - 10/22/2021  4:20 PM      Failed - This refill cannot be delegated      Failed - Urine Drug Screen completed in last 360 days      Failed - Valid encounter within last 6 months    Recent Outpatient Visits           7 months ago Annual physical exam   [facility] [name], MD   1 year ago Syncope and collapse   [facility] [name], MD   2 years ago Reactive depression   [facility] [name], MD   2 years ago Gastroesophageal reflux disease, esophagitis presence not specified   [facility] [name], MD   3 years ago Annual physical exam   [facility] [name], MD

## 2021-11-21 ENCOUNTER — Other Ambulatory Visit: Payer: Self-pay | Admitting: Family Medicine

## 2021-11-21 DIAGNOSIS — H1031 Unspecified acute conjunctivitis, right eye: Secondary | ICD-10-CM

## 2021-11-21 NOTE — Progress Notes (Signed)
Ofloxin eye drops sent in

## 2021-11-25 ENCOUNTER — Other Ambulatory Visit: Payer: Self-pay | Admitting: Family Medicine

## 2021-11-25 DIAGNOSIS — G4700 Insomnia, unspecified: Secondary | ICD-10-CM

## 2021-11-26 NOTE — Telephone Encounter (Signed)
Requested medication (s) are due for refill today:   Provider to review  Requested medication (s) are on the active medication list:   Yes  Future visit scheduled:   No   Last ordered: 10/27/2021 #30, 0 refills  Returned because this is a non delegated refill.     Requested Prescriptions  Pending Prescriptions Disp Refills   temazepam (RESTORIL) 30 MG capsule [Pharmacy Med Name: TEMAZEPAM 30 MG CAPSULE] 30 capsule 0    Sig: TAKE 1 CAPSULE BY MOUTH AT BEDTIME AS NEEDED FOR SLEEP     Not Delegated - Psychiatry: Anxiolytics/Hypnotics 2 Failed - 11/25/2021  8:36 PM      Failed - This refill cannot be delegated      Failed - Urine Drug Screen completed in last 360 days      Failed - Valid encounter within last 6 months    Recent Outpatient Visits           8 months ago Annual physical exam   [facility] Jerrol Banana., MD   1 year ago Syncope and collapse   [facility] Jerrol Banana., MD   2 years ago Reactive depression   [facility] Jerrol Banana., MD   2 years ago Gastroesophageal reflux disease, esophagitis presence not specified   [facility] Jerrol Banana., MD   3 years ago Annual physical exam   [facility] Jerrol Banana., MD              Passed - Patient is not pregnant

## 2021-12-25 ENCOUNTER — Other Ambulatory Visit: Payer: Self-pay | Admitting: Family Medicine

## 2021-12-25 DIAGNOSIS — G4700 Insomnia, unspecified: Secondary | ICD-10-CM

## 2022-05-26 ENCOUNTER — Other Ambulatory Visit: Payer: Self-pay | Admitting: Family Medicine

## 2022-05-26 DIAGNOSIS — G4700 Insomnia, unspecified: Secondary | ICD-10-CM

## 2022-06-24 ENCOUNTER — Other Ambulatory Visit: Payer: Self-pay | Admitting: Family Medicine

## 2022-06-24 DIAGNOSIS — R55 Syncope and collapse: Secondary | ICD-10-CM

## 2022-06-24 DIAGNOSIS — A419 Sepsis, unspecified organism: Secondary | ICD-10-CM

## 2022-06-24 DIAGNOSIS — E860 Dehydration: Secondary | ICD-10-CM

## 2022-06-24 DIAGNOSIS — R509 Fever, unspecified: Secondary | ICD-10-CM

## 2022-06-24 MED ORDER — PREDNISONE 20 MG PO TABS: 20.0000 mg | ORAL_TABLET | Freq: Every day | ORAL | 1 refills | Status: DC

## 2022-06-24 MED ORDER — AZITHROMYCIN 250 MG PO TABS: ORAL_TABLET | ORAL | 0 refills | Status: AC

## 2022-06-28 ENCOUNTER — Other Ambulatory Visit: Payer: Self-pay | Admitting: Family Medicine

## 2022-06-28 MED ORDER — PREDNISONE 20 MG PO TABS: 20.0000 mg | ORAL_TABLET | Freq: Every day | ORAL | 1 refills | Status: AC

## 2022-06-28 MED ORDER — DOXYCYCLINE HYCLATE 100 MG PO TABS: 100.0000 mg | ORAL_TABLET | Freq: Two times a day (BID) | ORAL | 1 refills | Status: AC

## 2022-06-28 NOTE — Progress Notes (Signed)
Persistent symptoms. Cough back.
